# Patient Record
Sex: Female | Born: 1960 | Race: White | Hispanic: No | Marital: Married | Smoking: Never smoker
Health system: Southern US, Community
[De-identification: ages and names within clinical notes are randomized; demographics above are authoritative.]

## PROBLEM LIST (undated history)

## (undated) DIAGNOSIS — E119 Type 2 diabetes mellitus without complications: Secondary | ICD-10-CM

## (undated) DIAGNOSIS — I10 Essential (primary) hypertension: Secondary | ICD-10-CM

## (undated) DIAGNOSIS — M1990 Unspecified osteoarthritis, unspecified site: Secondary | ICD-10-CM

## (undated) DIAGNOSIS — F429 Obsessive-compulsive disorder, unspecified: Secondary | ICD-10-CM

## (undated) DIAGNOSIS — E039 Hypothyroidism, unspecified: Secondary | ICD-10-CM

## (undated) DIAGNOSIS — F909 Attention-deficit hyperactivity disorder, unspecified type: Secondary | ICD-10-CM

## (undated) DIAGNOSIS — F419 Anxiety disorder, unspecified: Secondary | ICD-10-CM

## (undated) DIAGNOSIS — J45909 Unspecified asthma, uncomplicated: Secondary | ICD-10-CM

## (undated) DIAGNOSIS — K760 Fatty (change of) liver, not elsewhere classified: Secondary | ICD-10-CM

## (undated) DIAGNOSIS — F32A Depression, unspecified: Secondary | ICD-10-CM

## (undated) DIAGNOSIS — F329 Major depressive disorder, single episode, unspecified: Secondary | ICD-10-CM

## (undated) HISTORY — DX: Anxiety disorder, unspecified: F41.9

## (undated) HISTORY — DX: Depression, unspecified: F32.A

## (undated) HISTORY — DX: Type 2 diabetes mellitus without complications: E11.9

## (undated) HISTORY — DX: Attention-deficit hyperactivity disorder, unspecified type: F90.9

## (undated) HISTORY — PX: OTHER SURGICAL HISTORY: SHX169

## (undated) HISTORY — DX: Obsessive-compulsive disorder, unspecified: F42.9

## (undated) HISTORY — DX: Major depressive disorder, single episode, unspecified: F32.9

## (undated) HISTORY — PX: CHOLECYSTECTOMY: SHX55

---

## 1990-03-13 HISTORY — PX: BREAST CYST ASPIRATION: SHX578

## 2003-04-02 ENCOUNTER — Ambulatory Visit (HOSPITAL_BASED_OUTPATIENT_CLINIC_OR_DEPARTMENT_OTHER): Admission: RE | Admit: 2003-04-02 | Discharge: 2003-04-02 | Payer: Self-pay | Admitting: Obstetrics and Gynecology

## 2003-04-02 ENCOUNTER — Ambulatory Visit (HOSPITAL_COMMUNITY): Admission: RE | Admit: 2003-04-02 | Discharge: 2003-04-02 | Payer: Self-pay | Admitting: Obstetrics and Gynecology

## 2004-10-06 ENCOUNTER — Ambulatory Visit: Payer: Self-pay | Admitting: Obstetrics and Gynecology

## 2004-10-19 ENCOUNTER — Ambulatory Visit: Payer: Self-pay | Admitting: Obstetrics and Gynecology

## 2004-11-17 ENCOUNTER — Ambulatory Visit: Payer: Self-pay | Admitting: Oncology

## 2004-12-11 ENCOUNTER — Ambulatory Visit: Payer: Self-pay | Admitting: Oncology

## 2005-03-21 ENCOUNTER — Ambulatory Visit: Payer: Self-pay | Admitting: Oncology

## 2005-03-23 ENCOUNTER — Ambulatory Visit: Payer: Self-pay | Admitting: Oncology

## 2005-04-13 ENCOUNTER — Ambulatory Visit: Payer: Self-pay | Admitting: Oncology

## 2005-11-02 ENCOUNTER — Ambulatory Visit: Payer: Self-pay | Admitting: Obstetrics and Gynecology

## 2006-04-27 ENCOUNTER — Ambulatory Visit: Payer: Self-pay | Admitting: Otolaryngology

## 2006-11-20 ENCOUNTER — Ambulatory Visit: Payer: Self-pay | Admitting: Obstetrics and Gynecology

## 2007-12-10 ENCOUNTER — Ambulatory Visit: Payer: Self-pay | Admitting: Obstetrics and Gynecology

## 2008-08-07 ENCOUNTER — Ambulatory Visit: Payer: Self-pay | Admitting: Unknown Physician Specialty

## 2008-12-23 ENCOUNTER — Ambulatory Visit: Payer: Self-pay | Admitting: Obstetrics and Gynecology

## 2009-07-27 ENCOUNTER — Ambulatory Visit: Payer: Self-pay

## 2010-01-25 ENCOUNTER — Ambulatory Visit: Payer: Self-pay | Admitting: Obstetrics and Gynecology

## 2010-02-07 ENCOUNTER — Ambulatory Visit: Payer: Self-pay | Admitting: Obstetrics and Gynecology

## 2011-02-14 ENCOUNTER — Ambulatory Visit: Payer: Self-pay | Admitting: Obstetrics and Gynecology

## 2012-08-06 ENCOUNTER — Ambulatory Visit: Payer: Self-pay | Admitting: Internal Medicine

## 2013-05-07 ENCOUNTER — Ambulatory Visit: Payer: Self-pay | Admitting: Obstetrics and Gynecology

## 2013-08-15 ENCOUNTER — Ambulatory Visit: Payer: Self-pay | Admitting: Internal Medicine

## 2014-05-11 ENCOUNTER — Ambulatory Visit: Payer: Self-pay | Admitting: Internal Medicine

## 2015-03-30 ENCOUNTER — Other Ambulatory Visit: Payer: Self-pay | Admitting: Internal Medicine

## 2015-03-30 DIAGNOSIS — Z1231 Encounter for screening mammogram for malignant neoplasm of breast: Secondary | ICD-10-CM

## 2015-05-13 ENCOUNTER — Ambulatory Visit
Admission: RE | Admit: 2015-05-13 | Discharge: 2015-05-13 | Disposition: A | Discharge status: Home / Self Care | Payer: BC Managed Care – PPO | Source: Ambulatory Visit | Attending: Internal Medicine | Admitting: Internal Medicine

## 2015-05-13 DIAGNOSIS — Z1231 Encounter for screening mammogram for malignant neoplasm of breast: Secondary | ICD-10-CM | POA: Insufficient documentation

## 2015-06-14 ENCOUNTER — Other Ambulatory Visit: Payer: Self-pay | Admitting: Internal Medicine

## 2015-06-14 DIAGNOSIS — N644 Mastodynia: Secondary | ICD-10-CM

## 2015-06-25 ENCOUNTER — Other Ambulatory Visit: Payer: BC Managed Care – PPO

## 2015-06-25 ENCOUNTER — Ambulatory Visit: Payer: BC Managed Care – PPO

## 2015-06-25 ENCOUNTER — Ambulatory Visit
Admission: RE | Admit: 2015-06-25 | Discharge: 2015-06-25 | Disposition: A | Discharge status: Home / Self Care | Payer: BC Managed Care – PPO | Source: Ambulatory Visit | Attending: Internal Medicine | Admitting: Internal Medicine

## 2015-06-25 DIAGNOSIS — N644 Mastodynia: Secondary | ICD-10-CM

## 2015-12-23 ENCOUNTER — Other Ambulatory Visit: Payer: Self-pay | Admitting: Obstetrics and Gynecology

## 2015-12-23 DIAGNOSIS — Z1231 Encounter for screening mammogram for malignant neoplasm of breast: Secondary | ICD-10-CM

## 2016-05-18 ENCOUNTER — Ambulatory Visit
Admission: RE | Admit: 2016-05-18 | Discharge: 2016-05-18 | Disposition: A | Discharge status: Home / Self Care | Payer: BC Managed Care – PPO | Source: Ambulatory Visit | Attending: Obstetrics and Gynecology | Admitting: Obstetrics and Gynecology

## 2016-05-18 DIAGNOSIS — Z1231 Encounter for screening mammogram for malignant neoplasm of breast: Secondary | ICD-10-CM | POA: Diagnosis not present

## 2016-10-23 ENCOUNTER — Encounter (HOSPITAL_COMMUNITY): Payer: Self-pay | Admitting: Psychiatry

## 2016-10-23 ENCOUNTER — Other Ambulatory Visit (HOSPITAL_COMMUNITY): Payer: BC Managed Care – PPO

## 2016-10-23 ENCOUNTER — Other Ambulatory Visit (HOSPITAL_COMMUNITY): Payer: BC Managed Care – PPO | Attending: Psychiatry | Admitting: Psychiatry

## 2016-10-23 DIAGNOSIS — F332 Major depressive disorder, recurrent severe without psychotic features: Secondary | ICD-10-CM | POA: Insufficient documentation

## 2016-10-23 DIAGNOSIS — F502 Bulimia nervosa: Secondary | ICD-10-CM | POA: Insufficient documentation

## 2016-10-23 DIAGNOSIS — F900 Attention-deficit hyperactivity disorder, predominantly inattentive type: Secondary | ICD-10-CM | POA: Diagnosis not present

## 2016-10-23 DIAGNOSIS — F5000 Anorexia nervosa, unspecified: Secondary | ICD-10-CM | POA: Insufficient documentation

## 2016-10-23 DIAGNOSIS — F429 Obsessive-compulsive disorder, unspecified: Secondary | ICD-10-CM | POA: Diagnosis not present

## 2016-10-23 MED ORDER — LISDEXAMFETAMINE DIMESYLATE 20 MG PO CAPS: 20.0000 mg | ORAL_CAPSULE | Freq: Every day | ORAL | 0 refills | Status: DC

## 2016-10-23 NOTE — Progress Notes (Signed)
    Daily Group Progress Note  Program: IOP  Group Time: 9:00-12:00  Participation Level: Active  Behavioral Response: Appropriate  Type of Therapy:  Group Therapy  Summary of Progress: Pt.'s first day in group and met with psychiatrist and case manager for intake assessment for most of the group. Pt. Presented as generally anxious, but was able to introduce herself to the group. Pt. Shared with the group that she was challenged by OCD and that work and her adolescent son with ADHD were significant stressors for her.       Nancie Neas, LPC
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
## 2016-10-24 ENCOUNTER — Other Ambulatory Visit (HOSPITAL_COMMUNITY): Payer: BC Managed Care – PPO | Admitting: Psychiatry

## 2016-10-24 DIAGNOSIS — F332 Major depressive disorder, recurrent severe without psychotic features: Secondary | ICD-10-CM | POA: Diagnosis not present

## 2016-10-24 DIAGNOSIS — F900 Attention-deficit hyperactivity disorder, predominantly inattentive type: Secondary | ICD-10-CM

## 2016-10-24 NOTE — Progress Notes (Signed)
    Daily Group Progress Note  Program: IOP  Group Time: 9:00-12:00  Participation Level: Active  Behavioral Response: Appropriate  Type of Therapy:  Group Therapy  Summary of Progress: Pt. Presents with primarily flat affect, talkative, engaged in the group process. Pt. Shared that she was doing "ok". Pt. Shared that she did not sleep well last night because she was worried about coming to group, but that she usually sleeps on and off during the day as a pattern of avoidance. Pt. Discussed in detail that she is unhappy in her job, that she has significant problem in the workplace being assertive with her manager and asking for necessary space boundaries to get her work done. Pt. Discussed that she does not do this because she is afraid of retaliation from her manager. Pt. Participated in discussion about understanding intentional and unintentional cognitive models. Pt. Participated in grief and loss group with the chaplain.     Shaune PollackBrown, Jennifer B, LPC

## 2016-10-25 ENCOUNTER — Other Ambulatory Visit (HOSPITAL_COMMUNITY): Payer: BC Managed Care – PPO | Admitting: Psychiatry

## 2016-10-25 DIAGNOSIS — F332 Major depressive disorder, recurrent severe without psychotic features: Secondary | ICD-10-CM | POA: Diagnosis not present

## 2016-10-25 DIAGNOSIS — F900 Attention-deficit hyperactivity disorder, predominantly inattentive type: Secondary | ICD-10-CM

## 2016-10-26 ENCOUNTER — Other Ambulatory Visit (HOSPITAL_COMMUNITY): Payer: BC Managed Care – PPO | Admitting: Psychiatry

## 2016-10-26 DIAGNOSIS — F900 Attention-deficit hyperactivity disorder, predominantly inattentive type: Secondary | ICD-10-CM

## 2016-10-26 DIAGNOSIS — F332 Major depressive disorder, recurrent severe without psychotic features: Secondary | ICD-10-CM | POA: Diagnosis not present

## 2016-10-26 NOTE — Progress Notes (Signed)
    Daily Group Progress Note  Program: IOP  Group Time: 9:00-12:00  Participation Level: Active  Behavioral Response: Appropriate  Type of Therapy:  Group Therapy  Summary of Progress: Pt. Presents with brightened affect, talkative and responsive to questions from the therapist and other group members, mildly anxious. Pt. Participated in day two of cognitive modeling activity. Pt. Discussed her history with OCD and fear of germs that has caused problems with work and personal life. Pt. Discussed her desire to change, but concern that she would not be able to work through this. Pt. Discussed her unintentional model that people have germs and germs are bad. This is a thought that causes her fear and anxiety and over the time the anxiety has resulted in loneliness, boredom, and isolation.      Shaune PollackBrown, Jennifer B, LPC

## 2016-10-27 ENCOUNTER — Other Ambulatory Visit (HOSPITAL_COMMUNITY): Payer: BC Managed Care – PPO | Admitting: Psychiatry

## 2016-10-27 DIAGNOSIS — F332 Major depressive disorder, recurrent severe without psychotic features: Secondary | ICD-10-CM | POA: Diagnosis not present

## 2016-10-27 DIAGNOSIS — F900 Attention-deficit hyperactivity disorder, predominantly inattentive type: Secondary | ICD-10-CM

## 2016-10-27 NOTE — Progress Notes (Signed)
    Daily Group Progress Note  Program: IOP  Group Time: 9:00-12:00  Participation Level: Active  Behavioral Response: Appropriate  Type of Therapy:  Group Therapy  Summary of Progress: Pt. Presents as anxious. Pt. Continued to discussed history with OCD and problems with managing the disorder. Counselor discussed developing frustration tolerance generally and coming up with a plan for the patient to find one manageable goal for exposing herself to a stressor. Pt. Agreed that she would be able to expose herself to doorknobs, wash her hands or use hand sanitizer and sit with the discomfort of having touched the doorknob during the group. Counselor reviewed the grounding series and directed the group in a brief breath focused meditation to help with building distress tolerance.      Shaune Pollack, LPC

## 2016-10-30 ENCOUNTER — Other Ambulatory Visit (HOSPITAL_COMMUNITY): Payer: BC Managed Care – PPO | Admitting: Psychiatry

## 2016-10-30 DIAGNOSIS — F332 Major depressive disorder, recurrent severe without psychotic features: Secondary | ICD-10-CM | POA: Diagnosis not present

## 2016-10-30 DIAGNOSIS — F900 Attention-deficit hyperactivity disorder, predominantly inattentive type: Secondary | ICD-10-CM

## 2016-10-30 NOTE — Progress Notes (Signed)
    Daily Group Progress Note  Program: IOP    Group Time: 9:00-12:00   Participation Level: Active   Behavioral Response: Appropriate, Rigid   Type of Therapy:  Group Therapy   Summary of Progress: Pt. presented as reserved but engaged.  Pt. asked other group members questions about their medications and made comments like, "I wish I didn't feel anything".  Pt. expressed that she feels pain in her stomach in the morning at the thought of having to be around other people during her day.  Pt. said that she doesn't like her job and does not care about it. Pt. watched Ted Talk about positive psychology and engaged in group discussion regarding incorporating healthy habits, such as gratitude, journaling, acts of kindness, exercise and meditation into daily life.  Counselor presented RAIN model of mindfulness.    Shaune Pollack, LPC

## 2016-10-31 ENCOUNTER — Other Ambulatory Visit (HOSPITAL_COMMUNITY): Payer: BC Managed Care – PPO | Admitting: Psychiatry

## 2016-10-31 DIAGNOSIS — F332 Major depressive disorder, recurrent severe without psychotic features: Secondary | ICD-10-CM | POA: Diagnosis not present

## 2016-10-31 DIAGNOSIS — F900 Attention-deficit hyperactivity disorder, predominantly inattentive type: Secondary | ICD-10-CM
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
## 2016-11-01 ENCOUNTER — Other Ambulatory Visit (HOSPITAL_COMMUNITY): Payer: BC Managed Care – PPO | Admitting: Psychiatry

## 2016-11-01 DIAGNOSIS — F900 Attention-deficit hyperactivity disorder, predominantly inattentive type: Secondary | ICD-10-CM

## 2016-11-01 DIAGNOSIS — F332 Major depressive disorder, recurrent severe without psychotic features: Secondary | ICD-10-CM | POA: Diagnosis not present

## 2016-11-02 ENCOUNTER — Other Ambulatory Visit (HOSPITAL_COMMUNITY): Payer: BC Managed Care – PPO | Admitting: Psychiatry

## 2016-11-02 DIAGNOSIS — F900 Attention-deficit hyperactivity disorder, predominantly inattentive type: Secondary | ICD-10-CM

## 2016-11-02 DIAGNOSIS — F332 Major depressive disorder, recurrent severe without psychotic features: Secondary | ICD-10-CM | POA: Diagnosis not present

## 2016-11-02 NOTE — Progress Notes (Signed)
    Daily Group Progress Note  Program: IOP  Group Time: 9:00-12:00  Participation Level: Active  Behavioral Response: Appropriate  Type of Therapy:  Group Therapy  Summary of Progress: Pt. Met with psychiatrist and case manager in preparation for discharge. Pt. Smiles appropriately and engaged with the group. Pt. Reported some progress in her ability to assert herself with her husband and her son. Pt. Continues to state "I would like not to feel as this is peaceful to me" and was open to feedback that from the counselor that recovery from depression would look like greater ability to feel variety of feelings including sadness, anger, joy, and contentment. Pt. Participated in breath focused meditation and stated that she continues to be challenged by the tendency to over thing the process of meditation and critique herself.     Nancie Neas, LPC

## 2016-11-02 NOTE — Progress Notes (Signed)
    Daily Group Progress Note  Program: IOP  Group Time: 9:00-12:00   Participation Level: Active   Behavioral Response: Appropriate, Sharing   Type of Therapy:  Group Therapy   Summary of Progress: Pt presented as engaged.  Pt shared that she was verbally a bit "aggressive" with her son and husband the night before, when her son refused to hand over his phone.  Pt said that the interaction didn't feel good but that her son apologized.  Pt told her husband "You need to support me!".  He then stormed out of the house and the two of them had not debriefed since.  Pt's son requested that only one of the parents come and pick him up from soccer practice "because A/C is better in the front seat".  Counselor pointed out that perhaps alliances are being formed within the family unit.  Pt listened to and engaged in presentation about nutrition, sleep and exercise provided by representative from [facility].  Pt said she gets daily exercise by walking her dog.  Shaune Pollack, LPC

## 2016-11-02 NOTE — Progress Notes (Signed)
Patient ID: GLENDIA KEIM, female   DOB: 13-Nov-1960, 56 y.o.   MRN: [id number]  [facility] IOP DISCHARGE NOTE  Patient:  Victoria Patterson DOB:  07/03/1960  Date of Admission: 10/23/2016  Date of Discharge: 11/02/2016  Reason for Admission:depression  IOP Course:attended and participated.  Still having suicidal thoughts at times but no intent or plan.  Too anxious to return to work and likely will not return to work when the temporary disability runs out.  Not able to work anywhere at the moment.  Overall however she is better, less depressed and more able to see herself working again.  Some eating issues with bulimia but not severe.  Vyvanse was tolerated and was increased by referring psychiatrist to 50 mg and that is likely to help the eating disorder as well as the ADHD symptoms.  No change in the OCD symptoms  Mental Status at Discharge:still suicidal thinking but no intent or plans.  Still depressed but less than on admission.  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  Bulimia.  ADHD inattentive  Level of Care:  IOP  Discharge destination:  Has appointments with her psychiatrist and therapist    Comments:  Ms Ludwig has done well considering the combination of disorders she has to deal with   The patient received suicide prevention pamphlet:  Yes   Carolanne Grumbling, MD

## 2016-11-02 NOTE — Progress Notes (Signed)
Victoria Patterson is a 56 y.o. , married, employed, Caucasian female who was referred per Dr. Maryruth Bun; treatment for worsening depressive and anxiety symptoms.  Pt's OCD is currently presenting as a germ phobia with multiple handwashing daily, inability to eat out, stay in hotels or go anywhere except church and grocery store.  Stated she's been depressed for yrs, but symptoms started to worsen a couple of months ago.  Triggers/Stressors:  1) Job of seven yrs.  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  Reports having poor evaluations recently.  Pt is having difficulty performing due to the ADD and OCD symptoms.  2)  59 yr old ADD son.  Pt states there has been conflict between she and him.  "He doesn't want to follow my directions and my husband (stepdad) wants to continue being his friend so he doesn't discipline him."  3)  Husband has colon cancer complications.  He had part of rectum removed in 2010; has a bag now.  Denies any previous psychiatric hospitalizations or suicide attempts.  Has been seeing Dr. Maryruth Bun and Karen [place], [facility] on an outpatient basis.  Reports ~ 14 yrs ago struggling with anorexia/bulemia.  Family hx:  Deceased mother (Bipolar) Deceased father (ETOH)  Pt successfully completed MH-IOP today.  Pt reports that she is working towards her goal of  feeling a little less depressed; which is evident by her Burns Depression Checklist .  Upon admission pt scored 40; today she scored 32.  "I have a desire to live again and I am relaxing more."  Continues to c/o ruminating a lot though.  "I have been able to voice my needs to my husband and son recently."  Pt states that Dr. Maryruth Bun has taken her out of work for six months.  Encouraged pt to take the time to work on self-care and not worry about employment.  Pt denies SI/HI or A/V hallucinations.  A:  D/C today.  F/U with Dr Maryruth Bun and Karen [place], [facility].  Encouraged support groups.  Provided pt with support.  Recommended pt to continue with [facility] anxiety  groups.  R:  Pt receptive.                                                                                                        Chestine Spore, RITA, M.Ed, CNA

## 2016-11-02 NOTE — Progress Notes (Signed)
    Daily Group Progress Note  Program: IOP  Group Time: 9:00-12:00   Participation Level: Active   Behavioral Response: Appropriate, Sharing   Type of Therapy:  Group Therapy   Summary of Progress: Pt presented as engaged.  Pt shared that her son's father "cussed her out" on the phone, which bothered her.  Then the same day, her son argued with her about his phone and her husband yelled at the son, which also caused her anxiety.  Pt shared that she has never felt that her husband supported her in parenting her son.  Pt shared that she sometimes wishes she did not have to feel at all-counselor challenged this statement.  Counselor recommended a support group for parents of children with ADD and/or a support group for individuals with OCD.  Pt indicated that she knew of a group for OCD.  Counselors and other group members provided pt with positive feedback surrounding both her situation and her ability to be vulnerable with the group.  Pt watched "The Power of Vulnerability" Ted Talk by Teresa Coombs, and engaged in group discussion about vulnerability.  Shaune Pollack, LPC

## 2016-11-02 NOTE — Patient Instructions (Signed)
D:  Patient successfully completed MH-IOP today.  A:  Follow up with Dr. Maryruth Bun  and Karen [place], [facility].  Encouraged support groups.  R:  Patient receptive.

## 2016-11-03 ENCOUNTER — Other Ambulatory Visit (HOSPITAL_COMMUNITY): Payer: BC Managed Care – PPO

## 2016-11-06 ENCOUNTER — Other Ambulatory Visit (HOSPITAL_COMMUNITY): Payer: BC Managed Care – PPO

## 2016-11-07 ENCOUNTER — Other Ambulatory Visit (HOSPITAL_COMMUNITY): Payer: BC Managed Care – PPO

## 2016-11-07 ENCOUNTER — Telehealth (HOSPITAL_COMMUNITY): Payer: Self-pay | Admitting: *Deleted

## 2016-11-07 NOTE — Telephone Encounter (Addendum)
Prior authorization for Vyvanse received. Unable to complete request due to patient not being seen in outpatient department. Patient must follow up with current outpatient psychiatrist.

## 2016-11-07 NOTE — Telephone Encounter (Signed)
Opened in error

## 2016-11-08 ENCOUNTER — Other Ambulatory Visit (HOSPITAL_COMMUNITY): Payer: BC Managed Care – PPO

## 2016-11-09 ENCOUNTER — Other Ambulatory Visit (HOSPITAL_COMMUNITY): Payer: BC Managed Care – PPO

## 2016-11-10 ENCOUNTER — Other Ambulatory Visit (HOSPITAL_COMMUNITY): Payer: BC Managed Care – PPO

## 2016-11-14 ENCOUNTER — Other Ambulatory Visit (HOSPITAL_COMMUNITY): Payer: BC Managed Care – PPO

## 2016-11-15 ENCOUNTER — Other Ambulatory Visit (HOSPITAL_COMMUNITY): Payer: BC Managed Care – PPO

## 2016-11-16 ENCOUNTER — Other Ambulatory Visit (HOSPITAL_COMMUNITY): Payer: BC Managed Care – PPO

## 2016-11-17 ENCOUNTER — Other Ambulatory Visit (HOSPITAL_COMMUNITY): Payer: BC Managed Care – PPO

## 2016-11-20 ENCOUNTER — Other Ambulatory Visit (HOSPITAL_COMMUNITY): Payer: BC Managed Care – PPO

## 2016-11-21 ENCOUNTER — Other Ambulatory Visit (HOSPITAL_COMMUNITY): Payer: BC Managed Care – PPO

## 2016-11-22 ENCOUNTER — Other Ambulatory Visit (HOSPITAL_COMMUNITY): Payer: BC Managed Care – PPO

## 2016-11-23 ENCOUNTER — Other Ambulatory Visit (HOSPITAL_COMMUNITY): Payer: BC Managed Care – PPO

## 2016-11-24 ENCOUNTER — Other Ambulatory Visit (HOSPITAL_COMMUNITY): Payer: BC Managed Care – PPO

## 2016-11-27 ENCOUNTER — Other Ambulatory Visit (HOSPITAL_COMMUNITY): Payer: BC Managed Care – PPO

## 2016-11-28 ENCOUNTER — Other Ambulatory Visit (HOSPITAL_COMMUNITY): Payer: BC Managed Care – PPO

## 2016-11-29 ENCOUNTER — Other Ambulatory Visit (HOSPITAL_COMMUNITY): Payer: BC Managed Care – PPO

## 2016-11-30 ENCOUNTER — Other Ambulatory Visit (HOSPITAL_COMMUNITY): Payer: BC Managed Care – PPO

## 2016-12-01 ENCOUNTER — Other Ambulatory Visit (HOSPITAL_COMMUNITY): Payer: BC Managed Care – PPO

## 2016-12-04 ENCOUNTER — Other Ambulatory Visit (HOSPITAL_COMMUNITY): Payer: BC Managed Care – PPO

## 2016-12-05 ENCOUNTER — Other Ambulatory Visit (HOSPITAL_COMMUNITY): Payer: BC Managed Care – PPO

## 2016-12-06 ENCOUNTER — Other Ambulatory Visit (HOSPITAL_COMMUNITY): Payer: BC Managed Care – PPO

## 2016-12-07 ENCOUNTER — Other Ambulatory Visit (HOSPITAL_COMMUNITY): Payer: BC Managed Care – PPO

## 2016-12-08 ENCOUNTER — Other Ambulatory Visit (HOSPITAL_COMMUNITY): Payer: BC Managed Care – PPO

## 2016-12-11 ENCOUNTER — Other Ambulatory Visit (HOSPITAL_COMMUNITY): Payer: BC Managed Care – PPO

## 2016-12-12 ENCOUNTER — Other Ambulatory Visit (HOSPITAL_COMMUNITY): Payer: BC Managed Care – PPO

## 2016-12-13 ENCOUNTER — Other Ambulatory Visit (HOSPITAL_COMMUNITY): Payer: BC Managed Care – PPO

## 2016-12-14 ENCOUNTER — Other Ambulatory Visit (HOSPITAL_COMMUNITY): Payer: BC Managed Care – PPO

## 2016-12-15 ENCOUNTER — Other Ambulatory Visit (HOSPITAL_COMMUNITY): Payer: BC Managed Care – PPO

## 2017-05-15 ENCOUNTER — Other Ambulatory Visit: Payer: Self-pay | Admitting: Obstetrics and Gynecology

## 2017-05-15 DIAGNOSIS — Z1231 Encounter for screening mammogram for malignant neoplasm of breast: Secondary | ICD-10-CM

## 2017-06-04 ENCOUNTER — Ambulatory Visit
Admission: RE | Admit: 2017-06-04 | Discharge: 2017-06-04 | Disposition: A | Discharge status: Home / Self Care | Payer: Managed Care, Other (non HMO) | Source: Ambulatory Visit | Attending: Obstetrics and Gynecology | Admitting: Obstetrics and Gynecology

## 2017-06-04 DIAGNOSIS — Z1231 Encounter for screening mammogram for malignant neoplasm of breast: Secondary | ICD-10-CM | POA: Insufficient documentation

## 2017-11-07 ENCOUNTER — Encounter: Payer: Self-pay | Admitting: Student in an Organized Health Care Education/Training Program

## 2017-11-07 ENCOUNTER — Ambulatory Visit
Payer: Managed Care, Other (non HMO) | Attending: Student in an Organized Health Care Education/Training Program | Admitting: Student in an Organized Health Care Education/Training Program

## 2017-11-07 ENCOUNTER — Other Ambulatory Visit: Payer: Self-pay | Admitting: Internal Medicine

## 2017-11-07 ENCOUNTER — Other Ambulatory Visit: Payer: Self-pay | Admitting: Student in an Organized Health Care Education/Training Program

## 2017-11-07 ENCOUNTER — Other Ambulatory Visit: Payer: Self-pay

## 2017-11-07 VITALS — BP 138/96 | HR 78 | Temp 97.8°F | Resp 16 | Ht 64.0 in | Wt 230.0 lb

## 2017-11-07 DIAGNOSIS — J45909 Unspecified asthma, uncomplicated: Secondary | ICD-10-CM | POA: Diagnosis not present

## 2017-11-07 DIAGNOSIS — E119 Type 2 diabetes mellitus without complications: Secondary | ICD-10-CM | POA: Diagnosis not present

## 2017-11-07 DIAGNOSIS — F419 Anxiety disorder, unspecified: Secondary | ICD-10-CM | POA: Diagnosis not present

## 2017-11-07 DIAGNOSIS — M7989 Other specified soft tissue disorders: Secondary | ICD-10-CM

## 2017-11-07 DIAGNOSIS — M25562 Pain in left knee: Secondary | ICD-10-CM

## 2017-11-07 DIAGNOSIS — M25561 Pain in right knee: Secondary | ICD-10-CM | POA: Diagnosis not present

## 2017-11-07 DIAGNOSIS — Z818 Family history of other mental and behavioral disorders: Secondary | ICD-10-CM | POA: Diagnosis not present

## 2017-11-07 DIAGNOSIS — G894 Chronic pain syndrome: Secondary | ICD-10-CM | POA: Diagnosis not present

## 2017-11-07 DIAGNOSIS — F329 Major depressive disorder, single episode, unspecified: Secondary | ICD-10-CM | POA: Diagnosis not present

## 2017-11-07 DIAGNOSIS — F909 Attention-deficit hyperactivity disorder, unspecified type: Secondary | ICD-10-CM | POA: Diagnosis not present

## 2017-11-07 DIAGNOSIS — Z79899 Other long term (current) drug therapy: Secondary | ICD-10-CM | POA: Insufficient documentation

## 2017-11-07 DIAGNOSIS — F428 Other obsessive-compulsive disorder: Secondary | ICD-10-CM | POA: Insufficient documentation

## 2017-11-07 DIAGNOSIS — G8929 Other chronic pain: Secondary | ICD-10-CM

## 2017-11-07 DIAGNOSIS — M170 Bilateral primary osteoarthritis of knee: Secondary | ICD-10-CM

## 2017-11-07 DIAGNOSIS — Z888 Allergy status to other drugs, medicaments and biological substances status: Secondary | ICD-10-CM | POA: Diagnosis not present

## 2017-11-07 DIAGNOSIS — Z7984 Long term (current) use of oral hypoglycemic drugs: Secondary | ICD-10-CM | POA: Diagnosis not present

## 2017-11-07 DIAGNOSIS — Z79890 Hormone replacement therapy (postmenopausal): Secondary | ICD-10-CM | POA: Diagnosis not present

## 2017-11-07 DIAGNOSIS — E039 Hypothyroidism, unspecified: Secondary | ICD-10-CM | POA: Diagnosis not present

## 2017-11-07 DIAGNOSIS — Z880 Allergy status to penicillin: Secondary | ICD-10-CM | POA: Diagnosis not present

## 2017-11-07 MED ORDER — DICLOFENAC SODIUM 75 MG PO TBEC: 75.0000 mg | DELAYED_RELEASE_TABLET | Freq: Two times a day (BID) | ORAL | 1 refills | Status: DC

## 2017-11-07 NOTE — Progress Notes (Signed)
Patient's Name: Victoria Patterson  MRN: [id number]  Referring Provider: Diamond Nickel, MD  DOB: 1961-03-01  PCP: Victoria Crouch, MD  DOS: 11/07/2017  Note by: Victoria Santa, MD  Service setting: Ambulatory outpatient  Specialty: Interventional Pain Management  Location: [facility] (AMB) Pain Management Facility  Visit type: Initial Patient Evaluation  Patient type: New Patient   Primary Reason(s) for Visit: Encounter for initial evaluation of one or more chronic problems (new to examiner) potentially causing chronic pain, and posing a threat to normal musculoskeletal function. (Level of risk: High) CC: Knee Pain (left)  HPI  Victoria Patterson is a 57 y.o. year old, female patient, who comes today to see [place] for the first time for an initial evaluation of her chronic pain. She has Bilateral primary osteoarthritis of knee; Chronic pain of both knees; Chronic pain syndrome; Arthralgia of both knees; Acquired hypothyroidism; ADD (attention deficit disorder); Asthma without status asthmaticus; Depression; and Diabetes mellitus type 2, uncomplicated ([place]) on their problem list. Today she comes in for evaluation of her Knee Pain (left)  Pain Assessment: Location: Left Knee Radiating: denies Onset: More than a month ago Duration: Chronic pain Quality: Sharp, Shooting Severity: 3 /10 (subjective, self-reported pain score)  Note: Reported level is compatible with observation.                         When using our objective Pain Scale, levels between 6 and 10/10 are said to belong in an emergency room, as it progressively worsens from a 6/10, described as severely limiting, requiring emergency care not usually available at an outpatient pain management facility. At a 6/10 level, communication becomes difficult and requires great effort. Assistance to reach the emergency department may be required. Facial flushing and profuse sweating along with potentially dangerous increases in heart rate and blood pressure will be  evident. Effect on ADL: cannot sleep through the night Timing: Intermittent Modifying factors: supporting knee in bed with pillows BP: (!) 138/96  HR: 78  Onset and Duration: Gradual, Date of onset: 11/2016 and Present longer than 3 months Cause of pain: "Standing on concrete floor and also walking for 3-4 hours at work" Severity: Getting worse Timing: During activity or exercise and After activity or exercise Aggravating Factors: Kneeling, Prolonged standing, Squatting, Stooping , Walking, Walking downhill and Working Alleviating Factors: Resting and Sitting Associated Problems: Constipation, Night-time cramps, Fatigue, Swelling, Pain that wakes patient up and Pain that does not allow patient to sleep Quality of Pain: Sharp and Work related Previous Examinations or Tests: Orthopedic evaluation Previous Treatments: Trigger point injections  The patient comes into the clinics today for the first time for a chronic pain management evaluation.   58 year old female presents with a chief complaint of bilateral knee pain.  Patient is referred by Dr. Candelaria Patterson.  Patient is status post right knee intra-articular steroid injection in January 2019 as well as left knee intra-articular steroid injection in March.  She is also had a bursa injection on the 19th.  Patient has completed physical therapy without any significant benefit.  She has tried various medications including NSAIDs such as meloxicam as well as ibuprofen.  Patient presents today for primarily bilateral knee pain that is worse along her inferior knee.  Patient states that she has his knee pain probably because of her job as she is standing on concrete most of the day.  She is referred here for bilateral genicular nerve block.  Medication management to continue  with PCP.  Today I took the time to provide the patient with information regarding my pain practice. The patient was informed that my practice is divided into two sections: an  interventional pain management section, as well as a completely separate and distinct medication management section. I explained that I have procedure days for my interventional therapies, and evaluation days for follow-ups and medication management. Because of the amount of documentation required during both, they are kept separated. This means that there is the possibility that she may be scheduled for a procedure on one day, and medication management the next. I have also informed her that because of staffing and facility limitations, I no longer take patients for medication management only. To illustrate the reasons for this, I gave the patient the example of surgeons, and how inappropriate it would be to refer a patient to his/her care, just to write for the post-surgical antibiotics on a surgery done by a different surgeon.   Because interventional pain management is my board-certified specialty, the patient was informed that joining my practice means that they are open to any and all interventional therapies. I made it clear that this does not mean that they will be forced to have any procedures done. What this means is that I believe interventional therapies to be essential part of the diagnosis and proper management of chronic pain conditions. Therefore, patients not interested in these interventional alternatives will be better served under the care of a different practitioner.  The patient was also made aware of my Comprehensive Pain Management Safety Guidelines where by joining my practice, they limit all of their nerve blocks and joint injections to those done by our practice, for as long as we are retained to manage their care.   Historic Controlled Substance Pharmacotherapy Review   Historical Background Evaluation: [place] PMP: Six (6) year initial data search conducted.             [place] Department of public safety, offender search: Editor, commissioning Information) Non-contributory Risk Assessment  Profile: Aberrant behavior: None observed or detected today Risk factors for fatal opioid overdose: None identified today Fatal overdose hazard ratio (HR): Calculation deferred Non-fatal overdose hazard ratio (HR): Calculation deferred Risk of opioid abuse or dependence: 0.7-3.0% with doses ? 36 MME/day and 6.1-26% with doses ? 120 MME/day. Substance use disorder (SUD) risk level: See below Personal History of Substance Abuse (SUD-Substance use disorder):  Alcohol: Negative  Illegal Drugs: Negative  Rx Drugs: Negative  ORT Risk Level calculation: Moderate Risk Opioid Risk Tool - 11/07/17 1343      Family History of Substance Abuse   Alcohol  Positive Female    Illegal Drugs  Negative    Rx Drugs  Negative      Personal History of Substance Abuse   Alcohol  Negative    Illegal Drugs  Negative    Rx Drugs  Negative      Age   Age between 12-45 years   No      History of Preadolescent Sexual Abuse   History of Preadolescent Sexual Abuse  Negative or Female      Psychological Disease   Psychological Disease  Positive    ADD  --   ADHD   Depression  Positive      Total Score   Opioid Risk Tool Scoring  4    Opioid Risk Interpretation  Moderate Risk      ORT Scoring interpretation table:  Score <3 = Low Risk for SUD  Score between 4-7 = Moderate Risk for SUD  Score >8 = High Risk for Opioid Abuse   PHQ-2 Depression Scale:  Total score: 3  PHQ-2 Scoring interpretation table: (Score and probability of major depressive disorder)  Score 0 = No depression  Score 1 = 15.4% Probability  Score 2 = 21.1% Probability  Score 3 = 38.4% Probability  Score 4 = 45.5% Probability  Score 5 = 56.4% Probability  Score 6 = 78.6% Probability   PHQ-9 Depression Scale:  Total score: 13  PHQ-9 Scoring interpretation table:  Score 0-4 = No depression  Score 5-9 = Mild depression  Score 10-14 = Moderate depression  Score 15-19 = Moderately severe depression  Score 20-27 = Severe  depression (2.4 times higher risk of SUD and 2.89 times higher risk of overuse)   Pharmacologic Plan: As per protocol, I have not taken over any controlled substance management, pending the results of ordered tests and/or consults. Med management to continue per PCP              Meds   Current Outpatient Medications:  .  bisoprolol-hydrochlorothiazide (ZIAC) 5-6.25 MG tablet, Take 1 tablet by mouth daily., Disp: , Rfl:  .  DULoxetine (CYMBALTA) 60 MG capsule, Take 60 mg by mouth daily., Disp: , Rfl:  .  glimepiride (AMARYL) 2 MG tablet, Take 4 mg by mouth daily with breakfast. , Disp: , Rfl:  .  lamoTRIgine (LAMICTAL) 100 MG tablet, Take 100 mg by mouth daily., Disp: , Rfl:  .  levothyroxine (SYNTHROID, LEVOTHROID) 50 MCG tablet, Take 50 mcg by mouth daily before breakfast., Disp: , Rfl:  .  metFORMIN (GLUCOPHAGE) 1000 MG tablet, Take 1,000 mg by mouth 2 (two) times daily with a meal., Disp: , Rfl:  .  diclofenac (VOLTAREN) 75 MG EC tablet, Take 1 tablet (75 mg total) by mouth 2 (two) times daily after a meal., Disp: 60 tablet, Rfl: 1 .  lisdexamfetamine (VYVANSE) 20 MG capsule, Take 1 capsule (20 mg total) by mouth daily. (Patient not taking: Reported on 11/07/2017), Disp: 30 capsule, Rfl: 0 .  LORazepam (ATIVAN) 0.5 MG tablet, Take 0.5 mg by mouth every 8 (eight) hours., Disp: , Rfl:   Imaging Review   Complexity Note: Imaging results reviewed. Results shared with Victoria Patterson, using [employer].                         ROS  Cardiovascular: High blood pressure Pulmonary or Respiratory: Wheezing and difficulty taking a deep full breath (Asthma) and Snoring  Neurological: No reported neurological signs or symptoms such as seizures, abnormal skin sensations, urinary and/or fecal incontinence, being born with an abnormal open spine and/or a tethered spinal cord Review of Past Neurological Studies: No results found for this or any previous visit. Psychological-Psychiatric: Anxiousness,  Depressed and Prone to panicking Gastrointestinal: Irregular, infrequent bowel movements (Constipation) Genitourinary: No reported renal or genitourinary signs or symptoms such as difficulty voiding or producing urine, peeing blood, non-functioning kidney, kidney stones, difficulty emptying the bladder, difficulty controlling the flow of urine, or chronic kidney disease Hematological: No reported hematological signs or symptoms such as prolonged bleeding, low or poor functioning platelets, bruising or bleeding easily, hereditary bleeding problems, low energy levels due to low hemoglobin or being anemic Endocrine: High blood sugar requiring insulin (IDDM) and Slow thyroid Rheumatologic: Joint aches and or swelling due to excess weight (Osteoarthritis) Musculoskeletal: Negative for myasthenia gravis, muscular dystrophy, multiple sclerosis or malignant hyperthermia  Work History: Working full time  Allergies  Victoria Patterson is allergic to penicillins and stadol [butorphanol].  Laboratory Chemistry  Inflammation Markers (CRP: Acute Phase) (ESR: Chronic Phase) No results found for: CRP, ESRSEDRATE, LATICACIDVEN                       Rheumatology Markers No results found for: RF, ANA, LABURIC, URICUR, LYMEIGGIGMAB, LYMEABIGMQN, HLAB27                      Renal Function Markers No results found for: BUN, CREATININE, BCR, GFRAA, GFRNONAA                           Hepatic Function Markers No results found for: AST, ALT, ALBUMIN, ALKPHOS, HCVAB, AMYLASE, LIPASE, AMMONIA                      Electrolytes No results found for: NA, K, CL, CALCIUM, MG, PHOS                      Neuropathy Markers No results found for: VITAMINB12, FOLATE, HGBA1C, HIV                      Bone Pathology Markers No results found for: VD25OH, [id number], PI9518AC1, [id number], 25OHVITD1, 25OHVITD2, 25OHVITD3, TESTOFREE, TESTOSTERONE                       Coagulation Parameters No results found for: INR, LABPROT, APTT,  PLT, DDIMER                      Cardiovascular Markers No results found for: BNP, CKTOTAL, CKMB, TROPONINI, HGB, HCT                       CA Markers No results found for: CEA, CA125, LABCA2                      Note: Lab results reviewed.  [place]  Drug: Victoria Patterson  reports that she does not use drugs. Alcohol:  reports that she does not drink alcohol. Tobacco:  reports that she has never smoked. She has never used smokeless tobacco. Medical:  has a past medical history of ADHD (attention deficit hyperactivity disorder), Anxiety, Depression, Diabetes mellitus, type II ([place]), and Obsessive-compulsive disorder. Family: family history includes Alcohol abuse in her father; Bipolar disorder in her mother; Breast cancer in her cousin; Breast cancer (age of onset: 66) in her paternal aunt; Ovarian cancer (age of onset: 10) in her mother.  Past Surgical History:  Procedure Laterality Date  . BREAST CYST ASPIRATION Left 1992   neg   Active Ambulatory Problems    Diagnosis Date Noted  . Bilateral primary osteoarthritis of knee 11/09/2017  . Chronic pain of both knees 11/09/2017  . Chronic pain syndrome 11/09/2017  . Arthralgia of both knees 11/09/2017  . Acquired hypothyroidism 11/09/2017  . ADD (attention deficit disorder) 11/09/2017  . Asthma without status asthmaticus 11/09/2017  . Depression 11/09/2017  . Diabetes mellitus type 2, uncomplicated ([place]) 10/93/2355   Resolved Ambulatory Problems    Diagnosis Date Noted  . No Resolved Ambulatory Problems   Past Medical History:  Diagnosis Date  . ADHD (attention deficit hyperactivity disorder)   . Anxiety   . Diabetes mellitus, type II ([place])   .  Obsessive-compulsive disorder    Constitutional Exam  General appearance: Well nourished, well developed, and well hydrated. In no apparent acute distress Vitals:   11/07/17 1326  BP: (!) 138/96  Pulse: 78  Resp: 16  Temp: 97.8 F (36.6 C)  TempSrc: Oral  SpO2: 98%  Weight: 230 lb  (104.3 kg)  Height: '5\' 4"'$  (1.626 m)   BMI Assessment: Estimated body mass index is 39.48 kg/m as calculated from the following:   Height as of this encounter: '5\' 4"'$  (1.626 m).   Weight as of this encounter: 230 lb (104.3 kg).  BMI interpretation table: BMI level Category Range association with higher incidence of chronic pain  <18 kg/m2 Underweight   18.5-24.9 kg/m2 Ideal body weight   25-29.9 kg/m2 Overweight Increased incidence by 20%  30-34.9 kg/m2 Obese (Class I) Increased incidence by 68%  35-39.9 kg/m2 Severe obesity (Class II) Increased incidence by 136%  >40 kg/m2 Extreme obesity (Class III) Increased incidence by 254%   Patient's current BMI Ideal Body weight  Body mass index is 39.48 kg/m. Ideal body weight: 54.7 kg (120 lb 9.5 oz) Adjusted ideal body weight: 74.6 kg (164 lb 5.7 oz)   BMI Readings from Last 4 Encounters:  11/07/17 39.48 kg/m   Wt Readings from Last 4 Encounters:  11/07/17 230 lb (104.3 kg)  Psych/Mental status: Alert, oriented x 3 (person, place, & time)       Eyes: PERLA Respiratory: No evidence of acute respiratory distress  Cervical Spine Area Exam  Skin & Axial Inspection: No masses, redness, edema, swelling, or associated skin lesions Alignment: Symmetrical Functional ROM: Unrestricted ROM      Stability: No instability detected Muscle Tone/Strength: Functionally intact. No obvious neuro-muscular anomalies detected. Sensory (Neurological): Unimpaired Palpation: No palpable anomalies              Upper Extremity (UE) Exam    Side: Right upper extremity  Side: Left upper extremity  Skin & Extremity Inspection: Skin color, temperature, and hair growth are WNL. No peripheral edema or cyanosis. No masses, redness, swelling, asymmetry, or associated skin lesions. No contractures.  Skin & Extremity Inspection: Skin color, temperature, and hair growth are WNL. No peripheral edema or cyanosis. No masses, redness, swelling, asymmetry, or associated  skin lesions. No contractures.  Functional ROM: Unrestricted ROM          Functional ROM: Unrestricted ROM          Muscle Tone/Strength: Functionally intact. No obvious neuro-muscular anomalies detected.  Muscle Tone/Strength: Functionally intact. No obvious neuro-muscular anomalies detected.  Sensory (Neurological): Unimpaired          Sensory (Neurological): Unimpaired          Palpation: No palpable anomalies              Palpation: No palpable anomalies              Provocative Test(s):  Phalen's test: deferred Tinel's test: deferred Apley's scratch test (touch opposite shoulder):  Action 1 (Across chest): deferred Action 2 (Overhead): deferred Action 3 (LB reach): deferred   Provocative Test(s):  Phalen's test: deferred Tinel's test: deferred Apley's scratch test (touch opposite shoulder):  Action 1 (Across chest): deferred Action 2 (Overhead): deferred Action 3 (LB reach): deferred    Thoracic Spine Area Exam  Skin & Axial Inspection: No masses, redness, or swelling Alignment: Symmetrical Functional ROM: Unrestricted ROM Stability: No instability detected Muscle Tone/Strength: Functionally intact. No obvious neuro-muscular anomalies detected. Sensory (Neurological): Unimpaired Muscle strength &  Tone: No palpable anomalies  Lumbar Spine Area Exam  Skin & Axial Inspection: No masses, redness, or swelling Alignment: Symmetrical Functional ROM: Unrestricted ROM       Stability: No instability detected Muscle Tone/Strength: Functionally intact. No obvious neuro-muscular anomalies detected. Sensory (Neurological): Unimpaired Palpation: No palpable anomalies       Provocative Tests: Hyperextension/rotation test: deferred today       Lumbar quadrant test (Kemp's test): deferred today       Lateral bending test: deferred today       Patrick's Maneuver: deferred today                   FABER test: deferred today                   S-I anterior distraction/compression test:  deferred today         S-I lateral compression test: deferred today         S-I Thigh-thrust test: deferred today         S-I Gaenslen's test: deferred today          Gait & Posture Assessment  Ambulation: Unassisted Gait: Relatively normal for age and body habitus Posture: WNL   Lower Extremity Exam    Side: Right lower extremity  Side: Left lower extremity  Stability: No instability observed          Stability: No instability observed          Skin & Extremity Inspection: Skin color, temperature, and hair growth are WNL. No peripheral edema or cyanosis. No masses, redness, swelling, asymmetry, or associated skin lesions. No contractures.  Skin & Extremity Inspection: Skin color, temperature, and hair growth are WNL. No peripheral edema or cyanosis. No masses, redness, swelling, asymmetry, or associated skin lesions. No contractures.  Functional ROM: Unrestricted ROM                  Functional ROM: Unrestricted ROM                  Muscle Tone/Strength: Functionally intact. No obvious neuro-muscular anomalies detected.  Muscle Tone/Strength: Functionally intact. No obvious neuro-muscular anomalies detected.  Sensory (Neurological): Unimpaired  Sensory (Neurological): Unimpaired  Palpation: No palpable anomalies  Palpation: No palpable anomalies   Assessment  Primary Diagnosis & Pertinent Problem List: The primary encounter diagnosis was Bilateral primary osteoarthritis of knee (L>>R). Diagnoses of Chronic pain of both knees (L>R, medial aspect), Chronic pain syndrome, and Arthralgia of both knees were also pertinent to this visit.  Visit Diagnosis (New problems to examiner): 1. Bilateral primary osteoarthritis of knee (L>>R)   2. Chronic pain of both knees (L>R, medial aspect)   3. Chronic pain syndrome   4. Arthralgia of both knees    57 year old female with history of chronic bilateral knee pain who presents as a referral from Dr. Candelaria Patterson for evaluation of bilateral genicular  nerve block.  Patient has failed medication therapy including NSAIDs as well as intra-articular knee steroid injections and bursa injections.  Risks and benefits of bilateral genicular nerve blocks were discussed with patient.  Patient would like to proceed.  Plan: -Bilateral genicular nerve block under fluoroscopy with sedation.  Possible radiofrequency ablation thereafter -Recommended patient discontinue all NSAIDs and trial of diclofenac 75 mg twice daily given increased synovial fluid absorption in the knee seen with this medication.  Plan of Care (Initial workup plan)  Note: Please be advised that as per protocol, today's visit has been an  evaluation only. We have not taken over the patient's controlled substance management.  Ordered Lab-work, Procedure(s), Referral(s), & Consult(s): Orders Placed This Encounter  Procedures  . GENICULAR NERVE BLOCK   Pharmacotherapy (current): Medications ordered:  Meds ordered this encounter  Medications  . diclofenac (VOLTAREN) 75 MG EC tablet    Sig: Take 1 tablet (75 mg total) by mouth 2 (two) times daily after a meal.    Dispense:  60 tablet    Refill:  1   Medications administered during this visit: Mccall ALynnette Caffey had no medications administered during this visit.     Provider-requested follow-up: No follow-ups on file.  Future Appointments  Date Time Provider [place]  11/13/2017 10:30 AM [facility]    Primary Care Physician: Victoria Crouch, MD Location: [facility] Note by: Victoria Patterson, M.D, Date: 11/07/2017; Time: 9:15 AM  Patient Instructions   Stop taking Celebrex.  Diclofenac has been escribed to your pharmacy.   Moderate Conscious Sedation, Adult Sedation is the use of medicines to promote relaxation and relieve discomfort and anxiety. Moderate conscious sedation is a type of sedation. Under moderate conscious sedation, you are less alert than normal, but you are  still able to respond to instructions, touch, or both. Moderate conscious sedation is used during short medical and dental procedures. It is milder than deep sedation, which is a type of sedation under which you cannot be easily woken up. It is also milder than general anesthesia, which is the use of medicines to make you unconscious. Moderate conscious sedation allows you to return to your regular activities sooner. Tell a health care provider about:  Any allergies you have.  All medicines you are taking, including vitamins, herbs, eye drops, creams, and over-the-counter medicines.  Use of steroids (by mouth or creams).  Any problems you or family members have had with sedatives and anesthetic medicines.  Any blood disorders you have.  Any surgeries you have had.  Any medical conditions you have, such as sleep apnea.  Whether you are pregnant or may be pregnant.  Any use of cigarettes, alcohol, marijuana, or street drugs. What are the risks? Generally, this is a safe procedure. However, problems may occur, including:  Getting too much medicine (oversedation).  Nausea.  Allergic reaction to medicines.  Trouble breathing. If this happens, a breathing tube may be used to help with breathing. It will be removed when you are awake and breathing on your own.  Heart trouble.  Lung trouble.  What happens before the procedure? Staying hydrated Follow instructions from your health care provider about hydration, which may include:  Up to 2 hours before the procedure - you may continue to drink clear liquids, such as water, clear fruit juice, black coffee, and plain tea.  Eating and drinking restrictions Follow instructions from your health care provider about eating and drinking, which may include:  8 hours before the procedure - stop eating heavy meals or foods such as meat, fried foods, or fatty foods.  6 hours before the procedure - stop eating light meals or foods, such as  toast or cereal.  6 hours before the procedure - stop drinking milk or drinks that contain milk.  2 hours before the procedure - stop drinking clear liquids.  Medicine  Ask your health care provider about:  Changing or stopping your regular medicines. This is especially important if you are taking diabetes medicines or blood thinners.  Taking medicines such as aspirin and  ibuprofen. These medicines can thin your blood. Do not take these medicines before your procedure if your health care provider instructs you not to.  Tests and exams  You will have a physical exam.  You may have blood tests done to show: ? How well your kidneys and liver are working. ? How well your blood can clot. General instructions  Plan to have someone take you home from the hospital or clinic.  If you will be going home right after the procedure, plan to have someone with you for 24 hours. What happens during the procedure?  An IV tube will be inserted into one of your veins.  Medicine to help you relax (sedative) will be given through the IV tube.  The medical or dental procedure will be performed. What happens after the procedure?  Your blood pressure, heart rate, breathing rate, and blood oxygen level will be monitored often until the medicines you were given have worn off.  Do not drive for 24 hours. This information is not intended to replace advice given to you by your health care provider. Make sure you discuss any questions you have with your health care provider. Document Released: 11/22/2000 Document Revised: 08/03/2015 Document Reviewed: 06/19/2015 Elsevier Interactive Patient Education  2018 [place]  What are the risk, side effects and possible complications? Generally speaking, most procedures are safe.  However, with any procedure there are risks, side effects, and the possibility of complications.  The risks and complications are dependent upon  the sites that are lesioned, or the type of nerve block to be performed.  The closer the procedure is to the spine, the more serious the risks are.  Great care is taken when placing the radio frequency needles, block needles or lesioning probes, but sometimes complications can occur. 1. Infection: Any time there is an injection through the skin, there is a risk of infection.  This is why sterile conditions are used for these blocks.  There are four possible types of infection. 1. Localized skin infection. 2. Central Nervous System Infection-This can be in the form of Meningitis, which can be deadly. 3. Epidural Infections-This can be in the form of an epidural abscess, which can cause pressure inside of the spine, causing compression of the spinal cord with subsequent paralysis. This would require an emergency surgery to decompress, and there are no guarantees that the patient would recover from the paralysis. 4. Discitis-This is an infection of the intervertebral discs.  It occurs in about 1% of discography procedures.  It is difficult to treat and it may lead to surgery.        2. Pain: the needles have to go through skin and soft tissues, will cause soreness.       3. Damage to internal structures:  The nerves to be lesioned may be near blood vessels or    other nerves which can be potentially damaged.       4. Bleeding: Bleeding is more common if the patient is taking blood thinners such as  aspirin, Coumadin, Ticiid, Plavix, etc., or if he/she have some genetic predisposition  such as hemophilia. Bleeding into the spinal canal can cause compression of the spinal  cord with subsequent paralysis.  This would require an emergency surgery to  decompress and there are no guarantees that the patient would recover from the  paralysis.       5. Pneumothorax:  Puncturing of a lung is a possibility, every time a  needle is introduced in  the area of the chest or upper back.  Pneumothorax refers to free air  around the  collapsed lung(s), inside of the thoracic cavity (chest cavity).  Another two possible  complications related to a similar event would include: Hemothorax and Chylothorax.   These are variations of the Pneumothorax, where instead of air around the collapsed  lung(s), you may have blood or chyle, respectively.       6. Spinal headaches: They may occur with any procedures in the area of the spine.       7. Persistent CSF (Cerebro-Spinal Fluid) leakage: This is a rare problem, but may occur  with prolonged intrathecal or epidural catheters either due to the formation of a fistulous  track or a dural tear.       8. Nerve damage: By working so close to the spinal cord, there is always a possibility of  nerve damage, which could be as serious as a permanent spinal cord injury with  paralysis.       9. Death:  Although rare, severe deadly allergic reactions known as "Anaphylactic  reaction" can occur to any of the medications used.      10. Worsening of the symptoms:  We can always make thing worse.  What are the chances of something like this happening? Chances of any of this occuring are extremely low.  By statistics, you have more of a chance of getting killed in a motor vehicle accident: while driving to the hospital than any of the above occurring .  Nevertheless, you should be aware that they are possibilities.  In general, it is similar to taking a shower.  Everybody knows that you can slip, hit your head and get killed.  Does that mean that you should not shower again?  Nevertheless always keep in mind that statistics do not mean anything if you happen to be on the wrong side of them.  Even if a procedure has a 1 (one) in a 1,000,000 (million) chance of going wrong, it you happen to be that one..Also, keep in mind that by statistics, you have more of a chance of having something go wrong when taking medications.  Who should not have this procedure? If you are on a blood thinning medication  (e.g. Coumadin, Plavix, see list of "Blood Thinners"), or if you have an active infection going on, you should not have the procedure.  If you are taking any blood thinners, please inform your physician.  How should I prepare for this procedure?  Do not eat or drink anything at least six hours prior to the procedure.  Bring a driver with you .  It cannot be a taxi.  Come accompanied by an adult that can drive you back, and that is strong enough to help you if your legs get weak or numb from the local anesthetic.  Take all of your medicines the morning of the procedure with just enough water to swallow them.  If you have diabetes, make sure that you are scheduled to have your procedure done first thing in the morning, whenever possible.  If you have diabetes, take only half of your insulin dose and notify our nurse that you have done so as soon as you arrive at the clinic.  If you are diabetic, but only take blood sugar pills (oral hypoglycemic), then do not take them on the morning of your procedure.  You may take them after you have had the procedure.  Do not take aspirin or any aspirin-containing medications, at least eleven (11) days prior to the procedure.  They may prolong bleeding.  Wear loose fitting clothing that may be easy to take off and that you would not mind if it got stained with Betadine or blood.  Do not wear any jewelry or perfume  Remove any nail coloring.  It will interfere with some of our monitoring equipment.  NOTE: Remember that this is not meant to be interpreted as a complete list of all possible complications.  Unforeseen problems may occur.  BLOOD THINNERS The following drugs contain aspirin or other products, which can cause increased bleeding during surgery and should not be taken for 2 weeks prior to and 1 week after surgery.  If you should need take something for relief of minor pain, you may take acetaminophen which is found in Tylenol,m Datril, Anacin-3  and Panadol. It is not blood thinner. The products listed below are.  Do not take any of the products listed below in addition to any listed on your instruction sheet.  A.P.C or A.P.C with Codeine Codeine Phosphate Capsules #3 Ibuprofen Ridaura  ABC compound Congesprin Imuran rimadil  Advil Cope Indocin Robaxisal  Alka-Seltzer Effervescent Pain Reliever and Antacid Coricidin or Coricidin-D  Indomethacin Rufen  Alka-Seltzer plus Cold Medicine Cosprin Ketoprofen S-A-C Tablets  Anacin Analgesic Tablets or Capsules Coumadin Korlgesic Salflex  Anacin Extra Strength Analgesic tablets or capsules CP-2 Tablets Lanoril Salicylate  Anaprox Cuprimine Capsules Levenox Salocol  Anexsia-D Dalteparin Magan Salsalate  Anodynos Darvon compound Magnesium Salicylate Sine-off  Ansaid Dasin Capsules Magsal Sodium Salicylate  Anturane Depen Capsules Marnal Soma  APF Arthritis pain formula Dewitt's Pills Measurin Stanback  Argesic Dia-Gesic Meclofenamic Sulfinpyrazone  Arthritis Bayer Timed Release Aspirin Diclofenac Meclomen Sulindac  Arthritis pain formula Anacin Dicumarol Medipren Supac  Analgesic (Safety coated) Arthralgen Diffunasal Mefanamic Suprofen  Arthritis Strength Bufferin Dihydrocodeine Mepro Compound Suprol  Arthropan liquid Dopirydamole Methcarbomol with Aspirin Synalgos  ASA tablets/Enseals Disalcid Micrainin Tagament  Ascriptin Doan's Midol Talwin  Ascriptin A/D Dolene Mobidin Tanderil  Ascriptin Extra Strength Dolobid Moblgesic Ticlid  Ascriptin with Codeine Doloprin or Doloprin with Codeine Momentum Tolectin  Asperbuf Duoprin Mono-gesic Trendar  Aspergum Duradyne Motrin or Motrin IB Triminicin  Aspirin plain, buffered or enteric coated Durasal Myochrisine Trigesic  Aspirin Suppositories Easprin Nalfon Trillsate  Aspirin with Codeine Ecotrin Regular or Extra Strength Naprosyn Uracel  Atromid-S Efficin Naproxen Ursinus  Auranofin Capsules Elmiron Neocylate Vanquish  Axotal Emagrin  Norgesic Verin  Azathioprine Empirin or Empirin with Codeine Normiflo Vitamin E  Azolid Emprazil Nuprin Voltaren  Bayer Aspirin plain, buffered or children's or timed BC Tablets or powders Encaprin Orgaran Warfarin Sodium  Buff-a-Comp Enoxaparin Orudis Zorpin  Buff-a-Comp with Codeine Equegesic Os-Cal-Gesic   Buffaprin Excedrin plain, buffered or Extra Strength Oxalid   Bufferin Arthritis Strength Feldene Oxphenbutazone   Bufferin plain or Extra Strength Feldene Capsules Oxycodone with Aspirin   Bufferin with Codeine Fenoprofen Fenoprofen Pabalate or Pabalate-SF   Buffets II Flogesic Panagesic   Buffinol plain or Extra Strength Florinal or Florinal with Codeine Panwarfarin   Buf-Tabs Flurbiprofen Penicillamine   Butalbital Compound Four-way cold tablets Penicillin   Butazolidin Fragmin Pepto-Bismol   Carbenicillin Geminisyn Percodan   Carna Arthritis Reliever Geopen Persantine   Carprofen Gold's salt Persistin   Chloramphenicol Goody's Phenylbutazone   Chloromycetin Haltrain Piroxlcam   Clmetidine heparin Plaquenil   Cllnoril Hyco-pap Ponstel   Clofibrate Hydroxy chloroquine Propoxyphen         Before stopping  any of these medications, be sure to consult the physician who ordered them.  Some, such as Coumadin (Warfarin) are ordered to prevent or treat serious conditions such as "deep thrombosis", "pumonary embolisms", and other heart problems.  The amount of time that you may need off of the medication may also vary with the medication and the reason for which you were taking it.  If you are taking any of these medications, please make sure you notify your pain physician before you undergo any procedures.

## 2017-11-07 NOTE — Progress Notes (Signed)
Safety precautions to be maintained throughout the outpatient stay will include: orient to surroundings, keep bed in low position, maintain call bell within reach at all times, provide assistance with transfer out of bed and ambulation.  

## 2017-11-07 NOTE — Patient Instructions (Addendum)
Stop taking Celebrex.  Diclofenac has been escribed to your pharmacy.   Moderate Conscious Sedation, Adult Sedation is the use of medicines to promote relaxation and relieve discomfort and anxiety. Moderate conscious sedation is a type of sedation. Under moderate conscious sedation, you are less alert than normal, but you are still able to respond to instructions, touch, or both. Moderate conscious sedation is used during short medical and dental procedures. It is milder than deep sedation, which is a type of sedation under which you cannot be easily woken up. It is also milder than general anesthesia, which is the use of medicines to make you unconscious. Moderate conscious sedation allows you to return to your regular activities sooner. Tell a health care provider about:  Any allergies you have.  All medicines you are taking, including vitamins, herbs, eye drops, creams, and over-the-counter medicines.  Use of steroids (by mouth or creams).  Any problems you or family members have had with sedatives and anesthetic medicines.  Any blood disorders you have.  Any surgeries you have had.  Any medical conditions you have, such as sleep apnea.  Whether you are pregnant or may be pregnant.  Any use of cigarettes, alcohol, marijuana, or street drugs. What are the risks? Generally, this is a safe procedure. However, problems may occur, including:  Getting too much medicine (oversedation).  Nausea.  Allergic reaction to medicines.  Trouble breathing. If this happens, a breathing tube may be used to help with breathing. It will be removed when you are awake and breathing on your own.  Heart trouble.  Lung trouble.  What happens before the procedure? Staying hydrated Follow instructions from your health care provider about hydration, which may include:  Up to 2 hours before the procedure - you may continue to drink clear liquids, such as water, clear fruit juice, black coffee, and  plain tea.  Eating and drinking restrictions Follow instructions from your health care provider about eating and drinking, which may include:  8 hours before the procedure - stop eating heavy meals or foods such as meat, fried foods, or fatty foods.  6 hours before the procedure - stop eating light meals or foods, such as toast or cereal.  6 hours before the procedure - stop drinking milk or drinks that contain milk.  2 hours before the procedure - stop drinking clear liquids.  Medicine  Ask your health care provider about:  Changing or stopping your regular medicines. This is especially important if you are taking diabetes medicines or blood thinners.  Taking medicines such as aspirin and ibuprofen. These medicines can thin your blood. Do not take these medicines before your procedure if your health care provider instructs you not to.  Tests and exams  You will have a physical exam.  You may have blood tests done to show: ? How well your kidneys and liver are working. ? How well your blood can clot. General instructions  Plan to have someone take you home from the hospital or clinic.  If you will be going home right after the procedure, plan to have someone with you for 24 hours. What happens during the procedure?  An IV tube will be inserted into one of your veins.  Medicine to help you relax (sedative) will be given through the IV tube.  The medical or dental procedure will be performed. What happens after the procedure?  Your blood pressure, heart rate, breathing rate, and blood oxygen level will be monitored often until the medicines  you were given have worn off.  Do not drive for 24 hours. This information is not intended to replace advice given to you by your health care provider. Make sure you discuss any questions you have with your health care provider. Document Released: 11/22/2000 Document Revised: 08/03/2015 Document Reviewed: 06/19/2015 Elsevier  Interactive Patient Education  2018 [employer]. GENERAL RISKS AND COMPLICATIONS  What are the risk, side effects and possible complications? Generally speaking, most procedures are safe.  However, with any procedure there are risks, side effects, and the possibility of complications.  The risks and complications are dependent upon the sites that are lesioned, or the type of nerve block to be performed.  The closer the procedure is to the spine, the more serious the risks are.  Great care is taken when placing the radio frequency needles, block needles or lesioning probes, but sometimes complications can occur. 1. Infection: Any time there is an injection through the skin, there is a risk of infection.  This is why sterile conditions are used for these blocks.  There are four possible types of infection. 1. Localized skin infection. 2. Central Nervous System Infection-This can be in the form of Meningitis, which can be deadly. 3. Epidural Infections-This can be in the form of an epidural abscess, which can cause pressure inside of the spine, causing compression of the spinal cord with subsequent paralysis. This would require an emergency surgery to decompress, and there are no guarantees that the patient would recover from the paralysis. 4. Discitis-This is an infection of the intervertebral discs.  It occurs in about 1% of discography procedures.  It is difficult to treat and it may lead to surgery.        2. Pain: the needles have to go through skin and soft tissues, will cause soreness.       3. Damage to internal structures:  The nerves to be lesioned may be near blood vessels or    other nerves which can be potentially damaged.       4. Bleeding: Bleeding is more common if the patient is taking blood thinners such as  aspirin, Coumadin, Ticiid, Plavix, etc., or if he/she have some genetic predisposition  such as hemophilia. Bleeding into the spinal canal can cause compression of the spinal  cord  with subsequent paralysis.  This would require an emergency surgery to  decompress and there are no guarantees that the patient would recover from the  paralysis.       5. Pneumothorax:  Puncturing of a lung is a possibility, every time a needle is introduced in  the area of the chest or upper back.  Pneumothorax refers to free air around the  collapsed lung(s), inside of the thoracic cavity (chest cavity).  Another two possible  complications related to a similar event would include: Hemothorax and Chylothorax.   These are variations of the Pneumothorax, where instead of air around the collapsed  lung(s), you may have blood or chyle, respectively.       6. Spinal headaches: They may occur with any procedures in the area of the spine.       7. Persistent CSF (Cerebro-Spinal Fluid) leakage: This is a rare problem, but may occur  with prolonged intrathecal or epidural catheters either due to the formation of a fistulous  track or a dural tear.       8. Nerve damage: By working so close to the spinal cord, there is always a possibility of  nerve  damage, which could be as serious as a permanent spinal cord injury with  paralysis.       9. Death:  Although rare, severe deadly allergic reactions known as "Anaphylactic  reaction" can occur to any of the medications used.      10. Worsening of the symptoms:  We can always make thing worse.  What are the chances of something like this happening? Chances of any of this occuring are extremely low.  By statistics, you have more of a chance of getting killed in a motor vehicle accident: while driving to the hospital than any of the above occurring .  Nevertheless, you should be aware that they are possibilities.  In general, it is similar to taking a shower.  Everybody knows that you can slip, hit your head and get killed.  Does that mean that you should not shower again?  Nevertheless always keep in mind that statistics do not mean anything if you happen to be on the  wrong side of them.  Even if a procedure has a 1 (one) in a 1,000,000 (million) chance of going wrong, it you happen to be that one..Also, keep in mind that by statistics, you have more of a chance of having something go wrong when taking medications.  Who should not have this procedure? If you are on a blood thinning medication (e.g. Coumadin, Plavix, see list of "Blood Thinners"), or if you have an active infection going on, you should not have the procedure.  If you are taking any blood thinners, please inform your physician.  How should I prepare for this procedure?  Do not eat or drink anything at least six hours prior to the procedure.  Bring a driver with you .  It cannot be a taxi.  Come accompanied by an adult that can drive you back, and that is strong enough to help you if your legs get weak or numb from the local anesthetic.  Take all of your medicines the morning of the procedure with just enough water to swallow them.  If you have diabetes, make sure that you are scheduled to have your procedure done first thing in the morning, whenever possible.  If you have diabetes, take only half of your insulin dose and notify our nurse that you have done so as soon as you arrive at the clinic.  If you are diabetic, but only take blood sugar pills (oral hypoglycemic), then do not take them on the morning of your procedure.  You may take them after you have had the procedure.  Do not take aspirin or any aspirin-containing medications, at least eleven (11) days prior to the procedure.  They may prolong bleeding.  Wear loose fitting clothing that may be easy to take off and that you would not mind if it got stained with Betadine or blood.  Do not wear any jewelry or perfume  Remove any nail coloring.  It will interfere with some of our monitoring equipment.  NOTE: Remember that this is not meant to be interpreted as a complete list of all possible complications.  Unforeseen problems may  occur.  BLOOD THINNERS The following drugs contain aspirin or other products, which can cause increased bleeding during surgery and should not be taken for 2 weeks prior to and 1 week after surgery.  If you should need take something for relief of minor pain, you may take acetaminophen which is found in Tylenol,m Datril, Anacin-3 and Panadol. It is not blood thinner.  The products listed below are.  Do not take any of the products listed below in addition to any listed on your instruction sheet.  A.P.C or A.P.C with Codeine Codeine Phosphate Capsules #3 Ibuprofen Ridaura  ABC compound Congesprin Imuran rimadil  Advil Cope Indocin Robaxisal  Alka-Seltzer Effervescent Pain Reliever and Antacid Coricidin or Coricidin-D  Indomethacin Rufen  Alka-Seltzer plus Cold Medicine Cosprin Ketoprofen S-A-C Tablets  Anacin Analgesic Tablets or Capsules Coumadin Korlgesic Salflex  Anacin Extra Strength Analgesic tablets or capsules CP-2 Tablets Lanoril Salicylate  Anaprox Cuprimine Capsules Levenox Salocol  Anexsia-D Dalteparin Magan Salsalate  Anodynos Darvon compound Magnesium Salicylate Sine-off  Ansaid Dasin Capsules Magsal Sodium Salicylate  Anturane Depen Capsules Marnal Soma  APF Arthritis pain formula Dewitt's Pills Measurin Stanback  Argesic Dia-Gesic Meclofenamic Sulfinpyrazone  Arthritis Bayer Timed Release Aspirin Diclofenac Meclomen Sulindac  Arthritis pain formula Anacin Dicumarol Medipren Supac  Analgesic (Safety coated) Arthralgen Diffunasal Mefanamic Suprofen  Arthritis Strength Bufferin Dihydrocodeine Mepro Compound Suprol  Arthropan liquid Dopirydamole Methcarbomol with Aspirin Synalgos  ASA tablets/Enseals Disalcid Micrainin Tagament  Ascriptin Doan's Midol Talwin  Ascriptin A/D Dolene Mobidin Tanderil  Ascriptin Extra Strength Dolobid Moblgesic Ticlid  Ascriptin with Codeine Doloprin or Doloprin with Codeine Momentum Tolectin  Asperbuf Duoprin Mono-gesic Trendar  Aspergum Duradyne  Motrin or Motrin IB Triminicin  Aspirin plain, buffered or enteric coated Durasal Myochrisine Trigesic  Aspirin Suppositories Easprin Nalfon Trillsate  Aspirin with Codeine Ecotrin Regular or Extra Strength Naprosyn Uracel  Atromid-S Efficin Naproxen Ursinus  Auranofin Capsules Elmiron Neocylate Vanquish  Axotal Emagrin Norgesic Verin  Azathioprine Empirin or Empirin with Codeine Normiflo Vitamin E  Azolid Emprazil Nuprin Voltaren  Bayer Aspirin plain, buffered or children's or timed BC Tablets or powders Encaprin Orgaran Warfarin Sodium  Buff-a-Comp Enoxaparin Orudis Zorpin  Buff-a-Comp with Codeine Equegesic Os-Cal-Gesic   Buffaprin Excedrin plain, buffered or Extra Strength Oxalid   Bufferin Arthritis Strength Feldene Oxphenbutazone   Bufferin plain or Extra Strength Feldene Capsules Oxycodone with Aspirin   Bufferin with Codeine Fenoprofen Fenoprofen Pabalate or Pabalate-SF   Buffets II Flogesic Panagesic   Buffinol plain or Extra Strength Florinal or Florinal with Codeine Panwarfarin   Buf-Tabs Flurbiprofen Penicillamine   Butalbital Compound Four-way cold tablets Penicillin   Butazolidin Fragmin Pepto-Bismol   Carbenicillin Geminisyn Percodan   Carna Arthritis Reliever Geopen Persantine   Carprofen Gold's salt Persistin   Chloramphenicol Goody's Phenylbutazone   Chloromycetin Haltrain Piroxlcam   Clmetidine heparin Plaquenil   Cllnoril Hyco-pap Ponstel   Clofibrate Hydroxy chloroquine Propoxyphen         Before stopping any of these medications, be sure to consult the physician who ordered them.  Some, such as Coumadin (Warfarin) are ordered to prevent or treat serious conditions such as "deep thrombosis", "pumonary embolisms", and other heart problems.  The amount of time that you may need off of the medication may also vary with the medication and the reason for which you were taking it.  If you are taking any of these medications, please make sure you notify your pain  physician before you undergo any procedures.

## 2017-11-09 DIAGNOSIS — F988 Other specified behavioral and emotional disorders with onset usually occurring in childhood and adolescence: Secondary | ICD-10-CM | POA: Insufficient documentation

## 2017-11-09 DIAGNOSIS — F329 Major depressive disorder, single episode, unspecified: Secondary | ICD-10-CM | POA: Insufficient documentation

## 2017-11-09 DIAGNOSIS — M25562 Pain in left knee: Secondary | ICD-10-CM | POA: Insufficient documentation

## 2017-11-09 DIAGNOSIS — M25561 Pain in right knee: Secondary | ICD-10-CM | POA: Insufficient documentation

## 2017-11-09 DIAGNOSIS — G8929 Other chronic pain: Secondary | ICD-10-CM | POA: Insufficient documentation

## 2017-11-09 DIAGNOSIS — J45909 Unspecified asthma, uncomplicated: Secondary | ICD-10-CM | POA: Insufficient documentation

## 2017-11-09 DIAGNOSIS — G894 Chronic pain syndrome: Secondary | ICD-10-CM | POA: Insufficient documentation

## 2017-11-09 DIAGNOSIS — E039 Hypothyroidism, unspecified: Secondary | ICD-10-CM | POA: Insufficient documentation

## 2017-11-09 DIAGNOSIS — M170 Bilateral primary osteoarthritis of knee: Secondary | ICD-10-CM | POA: Insufficient documentation

## 2017-11-09 DIAGNOSIS — E119 Type 2 diabetes mellitus without complications: Secondary | ICD-10-CM | POA: Insufficient documentation

## 2017-11-09 DIAGNOSIS — F32A Depression, unspecified: Secondary | ICD-10-CM | POA: Insufficient documentation

## 2017-11-13 ENCOUNTER — Ambulatory Visit: Payer: Managed Care, Other (non HMO)

## 2017-11-22 ENCOUNTER — Telehealth: Payer: Self-pay | Admitting: *Deleted

## 2017-11-23 ENCOUNTER — Ambulatory Visit: Payer: Managed Care, Other (non HMO)

## 2017-11-26 ENCOUNTER — Ambulatory Visit (HOSPITAL_BASED_OUTPATIENT_CLINIC_OR_DEPARTMENT_OTHER): Payer: Managed Care, Other (non HMO) | Admitting: Student in an Organized Health Care Education/Training Program

## 2017-11-26 ENCOUNTER — Other Ambulatory Visit: Payer: Self-pay

## 2017-11-26 ENCOUNTER — Encounter: Payer: Self-pay | Admitting: Student in an Organized Health Care Education/Training Program

## 2017-11-26 ENCOUNTER — Ambulatory Visit
Admission: RE | Admit: 2017-11-26 | Discharge: 2017-11-26 | Disposition: A | Discharge status: Home / Self Care | Payer: Managed Care, Other (non HMO) | Source: Ambulatory Visit | Attending: Student in an Organized Health Care Education/Training Program | Admitting: Student in an Organized Health Care Education/Training Program

## 2017-11-26 DIAGNOSIS — M25562 Pain in left knee: Secondary | ICD-10-CM | POA: Diagnosis present

## 2017-11-26 DIAGNOSIS — Z79899 Other long term (current) drug therapy: Secondary | ICD-10-CM | POA: Insufficient documentation

## 2017-11-26 DIAGNOSIS — Z7984 Long term (current) use of oral hypoglycemic drugs: Secondary | ICD-10-CM | POA: Insufficient documentation

## 2017-11-26 DIAGNOSIS — Z79890 Hormone replacement therapy (postmenopausal): Secondary | ICD-10-CM | POA: Diagnosis not present

## 2017-11-26 DIAGNOSIS — F419 Anxiety disorder, unspecified: Secondary | ICD-10-CM | POA: Insufficient documentation

## 2017-11-26 DIAGNOSIS — Z880 Allergy status to penicillin: Secondary | ICD-10-CM | POA: Insufficient documentation

## 2017-11-26 DIAGNOSIS — M170 Bilateral primary osteoarthritis of knee: Secondary | ICD-10-CM | POA: Insufficient documentation

## 2017-11-26 DIAGNOSIS — Z885 Allergy status to narcotic agent status: Secondary | ICD-10-CM | POA: Insufficient documentation

## 2017-11-26 MED ORDER — LACTATED RINGERS IV SOLN: 1000.0000 mL | Freq: Once | INTRAVENOUS | Status: DC

## 2017-11-26 MED ORDER — DEXAMETHASONE SODIUM PHOSPHATE 10 MG/ML IJ SOLN
INTRAMUSCULAR | Status: AC
  Filled 2017-11-26: qty 1

## 2017-11-26 MED ORDER — DEXAMETHASONE SODIUM PHOSPHATE 10 MG/ML IJ SOLN
10.0000 mg | Freq: Once | INTRAMUSCULAR | Status: AC
  Administered 2017-11-26: 10 mg

## 2017-11-26 MED ORDER — ROPIVACAINE HCL 2 MG/ML IJ SOLN
10.0000 mL | Freq: Once | INTRAMUSCULAR | Status: AC
  Administered 2017-11-26: 10 mL

## 2017-11-26 MED ORDER — LIDOCAINE HCL 2 % IJ SOLN
20.0000 mL | Freq: Once | INTRAMUSCULAR | Status: AC
  Administered 2017-11-26: 400 mg

## 2017-11-26 MED ORDER — ROPIVACAINE HCL 2 MG/ML IJ SOLN
INTRAMUSCULAR | Status: AC
  Filled 2017-11-26: qty 10

## 2017-11-26 MED ORDER — FENTANYL CITRATE (PF) 100 MCG/2ML IJ SOLN
25.0000 ug | INTRAMUSCULAR | Status: DC | PRN
  Administered 2017-11-26: 25 ug via INTRAVENOUS

## 2017-11-26 MED ORDER — FENTANYL CITRATE (PF) 100 MCG/2ML IJ SOLN
INTRAMUSCULAR | Status: AC
  Filled 2017-11-26: qty 2

## 2017-11-26 MED ORDER — LIDOCAINE HCL 2 % IJ SOLN
INTRAMUSCULAR | Status: AC
  Filled 2017-11-26: qty 20

## 2017-11-26 NOTE — Patient Instructions (Signed)

## 2017-11-26 NOTE — Progress Notes (Signed)
Patient's Name: Victoria Patterson  MRN: [id number]  Referring Provider: Edward Jolly, MD  DOB: 09/15/1960  PCP: Marguarite Arbour, MD  DOS: 11/26/2017  Note by: Edward Jolly, MD  Service setting: Ambulatory outpatient  Specialty: Interventional Pain Management  Patient type: Established  Location: [facility] (AMB) Pain Management Facility  Visit type: Interventional Procedure   Primary Reason for Visit: Interventional Pain Management Treatment. CC: Knee Pain (bilateral, left is worse)  Procedure:          Anesthesia, Analgesia, Anxiolysis:  Type: Genicular Nerves Block (Superior-lateral, Superior-medial, and Inferior-medial Genicular Nerves) #1  CPT: 64450      Primary Purpose: Diagnostic Region: Lateral, Anterior, and Medial aspects of the knee joint, above and below the knee joint proper. Level: Superior and inferior to the knee joint. Target Area: For Genicular Nerve block(s), the targets are: the superior-lateral genicular nerve, located in the lateral distal portion of the femoral shaft as it curves to form the lateral epicondyle, in the region of the distal femoral metaphysis; the superior-medial genicular nerve, located in the medial distal portion of the femoral shaft as it curves to form the medial epicondyle; and the inferior-medial genicular nerve, located in the medial, proximal portion of the tibial shaft, as it curves to form the medial epicondyle, in the region of the proximal tibial metaphysis. Approach: Anterior, percutaneous, ipsilateral approach. Laterality: Left knee Position: Modified Fowler's position with pillows under the targeted knee(s).  Type: Moderate (Conscious) Sedation combined with Local Anesthesia Indication(s): Analgesia and Anxiety Route: Intravenous (IV) IV Access: Secured Sedation: Meaningful verbal contact was maintained at all times during the procedure  Local Anesthetic: Lidocaine 1-2%   Indications: 1. Bilateral primary osteoarthritis of knee (L>>R)    Pain  Score: Pre-procedure: 3 /10 Post-procedure: 0-No pain/10  Pre-op Assessment:  Victoria Patterson is a 57 y.o. (year old), female patient, seen today for interventional treatment. She  has a past surgical history that includes Breast cyst aspiration (Left, 1992). Victoria Patterson has a current medication list which includes the following prescription(s): bisoprolol-hydrochlorothiazide, diclofenac, duloxetine, glimepiride, lamotrigine, levothyroxine, metformin, lisdexamfetamine, and lorazepam, and the following Facility-Administered Medications: fentanyl and lactated ringers. Her primarily concern today is the Knee Pain (bilateral, left is worse)  Initial Vital Signs:  Pulse/HCG Rate: 65ECG Heart Rate: (!) 59 Temp: 98.4 F (36.9 C) Resp: 16 BP: 120/80 SpO2: 99 %  BMI: Estimated body mass index is 39.65 kg/m as calculated from the following:   Height as of this encounter: 5\' 4"  (1.626 m).   Weight as of this encounter: 231 lb (104.8 kg).  Risk Assessment: Allergies: Reviewed. She is allergic to penicillins and stadol [butorphanol].  Allergy Precautions: None required Coagulopathies: Reviewed. None identified.  Blood-thinner therapy: None at this time Active Infection(s): Reviewed. None identified. Victoria Patterson is afebrile  Site Confirmation: Victoria Patterson was asked to confirm the procedure and laterality before marking the site Procedure checklist: Completed Consent: Before the procedure and under the influence of no sedative(s), amnesic(s), or anxiolytics, the patient was informed of the treatment options, risks and possible complications. To fulfill our ethical and legal obligations, as recommended by the American Medical Association's Code of Ethics, I have informed the patient of my clinical impression; the nature and purpose of the treatment or procedure; the risks, benefits, and possible complications of the intervention; the alternatives, including doing nothing; the risk(s) and benefit(s) of the  alternative treatment(s) or procedure(s); and the risk(s) and benefit(s) of doing nothing. The patient was provided information about the  general risks and possible complications associated with the procedure. These may include, but are not limited to: failure to achieve desired goals, infection, bleeding, organ or nerve damage, allergic reactions, paralysis, and death. In addition, the patient was informed of those risks and complications associated to the procedure, such as failure to decrease pain; infection; bleeding; organ or nerve damage with subsequent damage to sensory, motor, and/or autonomic systems, resulting in permanent pain, numbness, and/or weakness of one or several areas of the body; allergic reactions; (i.e.: anaphylactic reaction); and/or death. Furthermore, the patient was informed of those risks and complications associated with the medications. These include, but are not limited to: allergic reactions (i.e.: anaphylactic or anaphylactoid reaction(s)); adrenal axis suppression; blood sugar elevation that in diabetics may result in ketoacidosis or comma; water retention that in patients with history of congestive heart failure may result in shortness of breath, pulmonary edema, and decompensation with resultant heart failure; weight gain; swelling or edema; medication-induced neural toxicity; particulate matter embolism and blood vessel occlusion with resultant organ, and/or nervous system infarction; and/or aseptic necrosis of one or more joints. Finally, the patient was informed that Medicine is not an exact science; therefore, there is also the possibility of unforeseen or unpredictable risks and/or possible complications that may result in a catastrophic outcome. The patient indicated having understood very clearly. We have given the patient no guarantees and we have made no promises. Enough time was given to the patient to ask questions, all of which were answered to the patient's  satisfaction. Victoria Patterson has indicated that she wanted to continue with the procedure. Attestation: I, the ordering provider, attest that I have discussed with the patient the benefits, risks, side-effects, alternatives, likelihood of achieving goals, and potential problems during recovery for the procedure that I have provided informed consent. Date  Time: 11/26/2017 11:18 AM  Pre-Procedure Preparation:  Monitoring: As per clinic protocol. Respiration, ETCO2, SpO2, BP, heart rate and rhythm monitor placed and checked for adequate function Safety Precautions: Patient was assessed for positional comfort and pressure points before starting the procedure. Time-out: I initiated and conducted the "Time-out" before starting the procedure, as per protocol. The patient was asked to participate by confirming the accuracy of the "Time Out" information. Verification of the correct person, site, and procedure were performed and confirmed by me, the nursing staff, and the patient. "Time-out" conducted as per Joint Commission's Universal Protocol (UP.01.01.01). Time: 1247  Description of Procedure:          Area Prepped: Entire knee area, from mid-thigh to mid-shin, lateral, anterior, and medial aspects. Prepping solution: ChloraPrep (2% chlorhexidine gluconate and 70% isopropyl alcohol) Safety Precautions: Aspiration looking for blood return was conducted prior to all injections. At no point did we inject any substances, as a needle was being advanced. No attempts were made at seeking any paresthesias. Safe injection practices and needle disposal techniques used. Medications properly checked for expiration dates. SDV (single dose vial) medications used. Latex Allergy precautions taken.   Description of the Procedure: Protocol guidelines were followed. The patient was placed in position over the procedure table. The target area was identified and the area prepped in the usual manner. Skin & deeper tissues  infiltrated with local anesthetic. Appropriate amount of time allowed to pass for local anesthetics to take effect. The procedure needles were then advanced to the target area. Proper needle placement secured. Negative aspiration confirmed. Solution injected in intermittent fashion, asking for systemic symptoms every 0.5cc of injectate. The needles were then  removed and the area cleansed, making sure to leave some of the prepping solution back to take advantage of its long term bactericidal properties.  Vitals:   11/26/17 1253 11/26/17 1303 11/26/17 1313 11/26/17 1323  BP: 138/87 140/77 (!) 151/85 (!) 143/89  Pulse:      Resp: 15 16 18 18   Temp:   98.5 F (36.9 C) 98.5 F (36.9 C)  TempSrc:   Temporal Temporal  SpO2: 96% 97% 99% 99%  Weight:      Height:        Start Time: 1247 hrs. End Time: 1252 hrs. Materials:  Needle(s) Type: Spinal Needle Gauge: 22G Length: 3.5-in Medication(s): Please see orders for medications and dosing details. 5 cc solution made of 4 cc of 0.2% ropivacaine, 1 cc of Decadron 10 mg/cc.  1.5 cc injected at each level above. Imaging Guidance (Non-Spinal):          Type of Imaging Technique: Fluoroscopy Guidance (Non-Spinal) Indication(s): Assistance in needle guidance and placement for procedures requiring needle placement in or near specific anatomical locations not easily accessible without such assistance. Exposure Time: Please see nurses notes. Contrast: Before injecting any contrast, we confirmed that the patient did not have an allergy to iodine, shellfish, or radiological contrast. Once satisfactory needle placement was completed at the desired level, radiological contrast was injected. Contrast injected under live fluoroscopy. No contrast complications. See chart for type and volume of contrast used. Fluoroscopic Guidance: I was personally present during the use of fluoroscopy. "Tunnel Vision Technique" used to obtain the best possible view of the target  area. Parallax error corrected before commencing the procedure. "Direction-depth-direction" technique used to introduce the needle under continuous pulsed fluoroscopy. Once target was reached, antero-posterior, oblique, and lateral fluoroscopic projection used confirm needle placement in all planes. Images permanently stored in EMR. Interpretation: I personally interpreted the imaging intraoperatively. Adequate needle placement confirmed in multiple planes. Appropriate spread of contrast into desired area was observed. No evidence of afferent or efferent intravascular uptake. Permanent images saved into the patient's record.  Antibiotic Prophylaxis:   Anti-infectives (From admission, onward)   None     Indication(s): None identified  Post-operative Assessment:  Post-procedure Vital Signs:  Pulse/HCG Rate: 6564 Temp: 98.5 F (36.9 C) Resp: 18 BP: (!) 143/89 SpO2: 99 %  EBL: None  Complications: No immediate post-treatment complications observed by team, or reported by patient.  Note: The patient tolerated the entire procedure well. A repeat set of vitals were taken after the procedure and the patient was kept under observation following institutional policy, for this type of procedure. Post-procedural neurological assessment was performed, showing return to baseline, prior to discharge. The patient was provided with post-procedure discharge instructions, including a section on how to identify potential problems. Should any problems arise concerning this procedure, the patient was given instructions to immediately contact us, at any time, without hesitation. In any case, we plan to contact the patient by telephone for a follow-up status report regarding this interventional procedure.  Comments:  No additional relevant information. 5 out of 5 strength bilateral lower extremity: Plantar flexion, dorsiflexion, knee flexion, knee extension.  Plan of Care   Imaging Orders     DG C-Arm 1-60  Min-No Report Procedure Orders    No procedure(s) ordered today    Medications ordered for procedure: Meds ordered this encounter  Medications  . lactated ringers infusion 1,000 mL  . fentaNYL (SUBLIMAZE) injection 25-100 mcg    Make sure Narcan is available in the  pyxis when using this medication. In the event of respiratory depression (RR< 8/min): Titrate NARCAN (naloxone) in increments of 0.1 to 0.2 mg IV at 2-3 minute intervals, until desired degree of reversal.  . ropivacaine (PF) 2 mg/mL (0.2%) (NAROPIN) injection 10 mL  . lidocaine (XYLOCAINE) 2 % (with pres) injection 400 mg  . dexamethasone (DECADRON) injection 10 mg   Medications administered: We administered fentaNYL, ropivacaine (PF) 2 mg/mL (0.2%), lidocaine, and dexamethasone.  See the medical record for exact dosing, route, and time of administration.  New Prescriptions   No medications on file   Disposition: Discharge home  Discharge Date & Time: 11/26/2017; 1326 hrs.   Physician-requested Follow-up: Return in about 5 weeks (around 12/31/2017) for Post Procedure Evaluation.  Future Appointments  Date Time Provider Department Center  12/04/2017 11:00 AM [facility]  12/31/2017  2:15 PM Edward JollyLateef, Luvern Mischke, MD [facility] None   Primary Care Physician: Marguarite ArbourSparks, Jeffrey D, MD [facility] Note by: Edward JollyBilal Dresden Ament, MD Date: 11/26/2017; Time: 1:56 PM  Disclaimer:  Medicine is not an exact science. The only guarantee in medicine is that nothing is guaranteed. It is important to note that the decision to proceed with this intervention was based on the information collected from the patient. The Data and conclusions were drawn from the patient's questionnaire, the interview, and the physical examination. Because the information was provided in large part by the patient, it cannot be guaranteed that it has not been purposely or unconsciously manipulated. Every effort has been made to  obtain as much relevant data as possible for this evaluation. It is important to note that the conclusions that lead to this procedure are derived in large part from the available data. Always take into account that the treatment will also be dependent on availability of resources and existing treatment guidelines, considered by other Pain Management Practitioners as being common knowledge and practice, at the time of the intervention. For Medico-Legal purposes, it is also important to point out that variation in procedural techniques and pharmacological choices are the acceptable norm. The indications, contraindications, technique, and results of the above procedure should only be interpreted and judged by a Board-Certified Interventional Pain Specialist with extensive familiarity and expertise in the same exact procedure and technique.

## 2017-11-26 NOTE — Progress Notes (Signed)
Safety precautions to be maintained throughout the outpatient stay will include: orient to surroundings, keep bed in low position, maintain call bell within reach at all times, provide assistance with transfer out of bed and ambulation.  

## 2017-11-27 ENCOUNTER — Ambulatory Visit: Payer: Self-pay | Admitting: Student in an Organized Health Care Education/Training Program

## 2017-11-27 NOTE — Telephone Encounter (Signed)
Post procedure phone call.  LM 

## 2017-12-04 ENCOUNTER — Ambulatory Visit: Payer: Managed Care, Other (non HMO)

## 2017-12-11 ENCOUNTER — Ambulatory Visit
Admission: RE | Admit: 2017-12-11 | Discharge: 2017-12-11 | Disposition: A | Discharge status: Home / Self Care | Payer: Managed Care, Other (non HMO) | Source: Ambulatory Visit | Attending: Internal Medicine | Admitting: Internal Medicine

## 2017-12-12 ENCOUNTER — Telehealth: Payer: Self-pay

## 2017-12-12 NOTE — Telephone Encounter (Signed)
Pt Called and want's to know if she can come earlier than 10/14 , the procedure she had worked great so she want's to get the next one scheduled, If she needs follow up that fine, she wants to be seen earlier .

## 2017-12-13 NOTE — Telephone Encounter (Signed)
If I have an opening next week that is fine. Thanks

## 2017-12-14 ENCOUNTER — Telehealth: Payer: Self-pay | Admitting: *Deleted

## 2017-12-14 NOTE — Telephone Encounter (Signed)
Voicemail left with patient to see exactly what patient is needing.  She does have a f/up appt on 12/31/17 from last procedure.  York Spaniel that we would address this on Monday.

## 2017-12-31 ENCOUNTER — Encounter: Payer: Self-pay | Admitting: Student in an Organized Health Care Education/Training Program

## 2017-12-31 ENCOUNTER — Ambulatory Visit
Payer: Managed Care, Other (non HMO) | Attending: Student in an Organized Health Care Education/Training Program | Admitting: Student in an Organized Health Care Education/Training Program

## 2017-12-31 VITALS — BP 157/92 | HR 66 | Temp 98.1°F | Resp 16 | Ht 64.0 in | Wt 220.0 lb

## 2017-12-31 DIAGNOSIS — Z818 Family history of other mental and behavioral disorders: Secondary | ICD-10-CM | POA: Insufficient documentation

## 2017-12-31 DIAGNOSIS — M25561 Pain in right knee: Secondary | ICD-10-CM | POA: Diagnosis not present

## 2017-12-31 DIAGNOSIS — M170 Bilateral primary osteoarthritis of knee: Secondary | ICD-10-CM | POA: Diagnosis not present

## 2017-12-31 DIAGNOSIS — F419 Anxiety disorder, unspecified: Secondary | ICD-10-CM | POA: Insufficient documentation

## 2017-12-31 DIAGNOSIS — E039 Hypothyroidism, unspecified: Secondary | ICD-10-CM | POA: Insufficient documentation

## 2017-12-31 DIAGNOSIS — F329 Major depressive disorder, single episode, unspecified: Secondary | ICD-10-CM | POA: Insufficient documentation

## 2017-12-31 DIAGNOSIS — F909 Attention-deficit hyperactivity disorder, unspecified type: Secondary | ICD-10-CM | POA: Diagnosis not present

## 2017-12-31 DIAGNOSIS — Z7984 Long term (current) use of oral hypoglycemic drugs: Secondary | ICD-10-CM | POA: Diagnosis not present

## 2017-12-31 DIAGNOSIS — Z880 Allergy status to penicillin: Secondary | ICD-10-CM | POA: Insufficient documentation

## 2017-12-31 DIAGNOSIS — E119 Type 2 diabetes mellitus without complications: Secondary | ICD-10-CM | POA: Diagnosis not present

## 2017-12-31 DIAGNOSIS — J45909 Unspecified asthma, uncomplicated: Secondary | ICD-10-CM | POA: Insufficient documentation

## 2017-12-31 DIAGNOSIS — G894 Chronic pain syndrome: Secondary | ICD-10-CM | POA: Diagnosis not present

## 2017-12-31 DIAGNOSIS — Z79890 Hormone replacement therapy (postmenopausal): Secondary | ICD-10-CM | POA: Insufficient documentation

## 2017-12-31 DIAGNOSIS — Z79899 Other long term (current) drug therapy: Secondary | ICD-10-CM | POA: Diagnosis not present

## 2017-12-31 DIAGNOSIS — G8929 Other chronic pain: Secondary | ICD-10-CM

## 2017-12-31 DIAGNOSIS — M25562 Pain in left knee: Secondary | ICD-10-CM | POA: Diagnosis not present

## 2017-12-31 DIAGNOSIS — F429 Obsessive-compulsive disorder, unspecified: Secondary | ICD-10-CM | POA: Insufficient documentation

## 2017-12-31 DIAGNOSIS — Z885 Allergy status to narcotic agent status: Secondary | ICD-10-CM | POA: Insufficient documentation

## 2017-12-31 MED ORDER — DICLOFENAC SODIUM 75 MG PO TBEC: 75.0000 mg | DELAYED_RELEASE_TABLET | Freq: Two times a day (BID) | ORAL | 2 refills | Status: DC

## 2017-12-31 NOTE — Progress Notes (Signed)
Patient's Name: Victoria Patterson  MRN: [id number]  Referring Provider: Marguarite Arbour, MD  DOB: September 24, 1960  PCP: Marguarite Arbour, MD  DOS: 12/31/2017  Note by: Edward Jolly, MD  Service setting: Ambulatory outpatient  Specialty: Interventional Pain Management  Location: [facility] (AMB) Pain Management Facility    Patient type: Established   Primary Reason(s) for Visit: Encounter for post-procedure evaluation of chronic illness with mild to moderate exacerbation CC: Knee Pain (left last treated ) and Knee Pain (right)  HPI  [name] is a 57 y.o. year old, female patient, who comes today for a post-procedure evaluation. She has Bilateral primary osteoarthritis of knee; Chronic pain of both knees; Chronic pain syndrome; Arthralgia of both knees; Acquired hypothyroidism; ADD (attention deficit disorder); Asthma without status asthmaticus; Depression; and Diabetes mellitus type 2, uncomplicated (HCC) on their problem list. Her primarily concern today is the Knee Pain (left last treated ) and Knee Pain (right)  Pain Assessment: Location: (right knee as well ) Knee Radiating: denies Onset: More than a month ago Duration: Chronic pain Quality: Other (Comment)(left knee is improved which is what we treated.  right knee has some weakness on the outer aspect making it difficult to go up stairs. ) Severity: 0-No pain/10 (subjective, self-reported pain score)  Note: Reported level is compatible with observation.                         When using our objective Pain Scale, levels between 6 and 10/10 are said to belong in an emergency room, as it progressively worsens from a 6/10, described as severely limiting, requiring emergency care not usually available at an outpatient pain management facility. At a 6/10 level, communication becomes difficult and requires great effort. Assistance to reach the emergency department may be required. Facial flushing and profuse sweating along with potentially dangerous  increases in heart rate and blood pressure will be evident. Effect on ADL: right knee pain affects gait when going up stairs.  Timing: Constant Modifying factors: procedure helped left knee.  BP: (!) 157/92  HR: 66  [name] comes in today for post-procedure evaluation.  Further details on both, my assessment(s), as well as the proposed treatment plan, please see below.  Post-Procedure Assessment  11/26/2017 Procedure: Left knee genicular nerve block Pre-procedure pain score:  3/10 Post-procedure pain score: 0/10         Influential Factors: BMI: 37.76 kg/m Intra-procedural challenges: None observed.         Assessment challenges: None detected.              Reported side-effects: None.        Post-procedural adverse reactions or complications: None reported         Sedation: Please see nurses note. When no sedatives are used, the analgesic levels obtained are directly associated to the effectiveness of the local anesthetics. However, when sedation is provided, the level of analgesia obtained during the initial 1 hour following the intervention, is believed to be the result of a combination of factors. These factors may include, but are not limited to: 1. The effectiveness of the local anesthetics used. 2. The effects of the analgesic(s) and/or anxiolytic(s) used. 3. The degree of discomfort experienced by the patient at the time of the procedure. 4. The patients ability and reliability in recalling and recording the events. 5. The presence and influence of possible secondary gains and/or psychosocial factors. Reported result: Relief experienced during the 1st hour  after the procedure: 100 % (Ultra-Short Term Relief)            Interpretative annotation: Clinically appropriate result. Analgesia during this period is likely to be Local Anesthetic and/or IV Sedative (Analgesic/Anxiolytic) related.          Effects of local anesthetic: The analgesic effects attained during this period  are directly associated to the localized infiltration of local anesthetics and therefore cary significant diagnostic value as to the etiological location, or anatomical origin, of the pain. Expected duration of relief is directly dependent on the pharmacodynamics of the local anesthetic used. Long-acting (4-6 hours) anesthetics used.  Reported result: Relief during the next 4 to 6 hour after the procedure: 100 % (Short-Term Relief)            Interpretative annotation: Clinically appropriate result. Analgesia during this period is likely to be Local Anesthetic-related.          Long-term benefit: Defined as the period of time past the expected duration of local anesthetics (1 hour for short-acting and 4-6 hours for long-acting). With the possible exception of prolonged sympathetic blockade from the local anesthetics, benefits during this period are typically attributed to, or associated with, other factors such as analgesic sensory neuropraxia, antiinflammatory effects, or beneficial biochemical changes provided by agents other than the local anesthetics.  Reported result: Extended relief following procedure: 75 %(stabbing pain is gone, there is still some aching for which she takes diclofenac and 1000 mg tylenol for relief whiich does take the pain competely away. ) (Long-Term Relief)            Interpretative annotation: Clinically possible results. Good relief. No permanent benefit expected. Inflammation plays a part in the etiology to the pain.          Current benefits: Defined as reported results that persistent at this point in time.   Analgesia: >75 %            Function: [name] reports improvement in function ROM: [name] reports improvement in ROM Interpretative annotation: Ongoing benefit. Therapeutic benefit observed. Effective therapeutic approach.          Interpretation: Results would suggest a successful diagnostic and therapeutic intervention.                  Plan:  Set up  procedure as a PRN palliative treatment option for this patient.                 Meds   Current Outpatient Medications:  .  bisoprolol-hydrochlorothiazide (ZIAC) 5-6.25 MG tablet, Take 1 tablet by mouth daily., Disp: , Rfl:  .  diclofenac (VOLTAREN) 75 MG EC tablet, Take 1 tablet (75 mg total) by mouth 2 (two) times daily after a meal., Disp: 60 tablet, Rfl: 2 .  DULoxetine (CYMBALTA) 60 MG capsule, Take 60 mg by mouth daily., Disp: , Rfl:  .  ezetimibe (ZETIA) 10 MG tablet, Take 10 mg by mouth daily., Disp: , Rfl:  .  glimepiride (AMARYL) 2 MG tablet, Take 4 mg by mouth daily with breakfast. , Disp: , Rfl:  .  lamoTRIgine (LAMICTAL) 100 MG tablet, Take 100 mg by mouth daily., Disp: , Rfl:  .  levothyroxine (SYNTHROID, LEVOTHROID) 50 MCG tablet, Take 50 mcg by mouth daily before breakfast., Disp: , Rfl:  .  metFORMIN (GLUCOPHAGE) 1000 MG tablet, Take 500 mg by mouth 2 (two) times daily with a meal. , Disp: , Rfl:  .  LORazepam (ATIVAN) 0.5 MG  tablet, Take 0.5 mg by mouth every 8 (eight) hours., Disp: , Rfl:   ROS  Constitutional: Denies any fever or chills Gastrointestinal: No reported hemesis, hematochezia, vomiting, or acute GI distress Musculoskeletal: Denies any acute onset joint swelling, redness, loss of ROM, or weakness Neurological: No reported episodes of acute onset apraxia, aphasia, dysarthria, agnosia, amnesia, paralysis, loss of coordination, or loss of consciousness  Allergies  Ms. Escorcia is allergic to penicillins and stadol [butorphanol].  PFSH  Drug: Ms. Hubers  reports that she does not use drugs. Alcohol:  reports that she does not drink alcohol. Tobacco:  reports that she has never smoked. She has never used smokeless tobacco. Medical:  has a past medical history of ADHD (attention deficit hyperactivity disorder), Anxiety, Depression, Diabetes mellitus, type II (HCC), and Obsessive-compulsive disorder. Surgical: Ms. Massar  has a past surgical history that includes  Breast cyst aspiration (Left, 1992). Family: family history includes Alcohol abuse in her father; Bipolar disorder in her mother; Breast cancer in her cousin; Breast cancer (age of onset: 49) in her paternal aunt; Ovarian cancer (age of onset: 55) in her mother.  Constitutional Exam  General appearance: Well nourished, well developed, and well hydrated. In no apparent acute distress Vitals:   12/31/17 1430  BP: (!) 157/92  Pulse: 66  Resp: 16  Temp: 98.1 F (36.7 C)  TempSrc: Oral  SpO2: 98%  Weight: 220 lb (99.8 kg)  Height: 5\' 4"  (1.626 m)   BMI Assessment: Estimated body mass index is 37.76 kg/m as calculated from the following:   Height as of this encounter: 5\' 4"  (1.626 m).   Weight as of this encounter: 220 lb (99.8 kg).  BMI interpretation table: BMI level Category Range association with higher incidence of chronic pain  <18 kg/m2 Underweight   18.5-24.9 kg/m2 Ideal body weight   25-29.9 kg/m2 Overweight Increased incidence by 20%  30-34.9 kg/m2 Obese (Class I) Increased incidence by 68%  35-39.9 kg/m2 Severe obesity (Class II) Increased incidence by 136%  >40 kg/m2 Extreme obesity (Class III) Increased incidence by 254%   Patient's current BMI Ideal Body weight  Body mass index is 37.76 kg/m. Ideal body weight: 54.7 kg (120 lb 9.5 oz) Adjusted ideal body weight: 72.7 kg (160 lb 5.7 oz)   BMI Readings from Last 4 Encounters:  12/31/17 37.76 kg/m  11/26/17 39.65 kg/m  11/07/17 39.48 kg/m   Wt Readings from Last 4 Encounters:  12/31/17 220 lb (99.8 kg)  11/26/17 231 lb (104.8 kg)  11/07/17 230 lb (104.3 kg)  Psych/Mental status: Alert, oriented x 3 (person, place, & time)       Eyes: PERLA Respiratory: No evidence of acute respiratory distress  Cervical Spine Area Exam  Skin & Axial Inspection: No masses, redness, edema, swelling, or associated skin lesions Alignment: Symmetrical Functional ROM: Unrestricted ROM      Stability: No instability  detected Muscle Tone/Strength: Functionally intact. No obvious neuro-muscular anomalies detected. Sensory (Neurological): Unimpaired Palpation: No palpable anomalies              Upper Extremity (UE) Exam    Side: Right upper extremity  Side: Left upper extremity  Skin & Extremity Inspection: Skin color, temperature, and hair growth are WNL. No peripheral edema or cyanosis. No masses, redness, swelling, asymmetry, or associated skin lesions. No contractures.  Skin & Extremity Inspection: Skin color, temperature, and hair growth are WNL. No peripheral edema or cyanosis. No masses, redness, swelling, asymmetry, or associated skin lesions. No contractures.  Functional ROM: Unrestricted ROM          Functional ROM: Unrestricted ROM          Muscle Tone/Strength: Functionally intact. No obvious neuro-muscular anomalies detected.  Muscle Tone/Strength: Functionally intact. No obvious neuro-muscular anomalies detected.  Sensory (Neurological): Unimpaired          Sensory (Neurological): Unimpaired          Palpation: No palpable anomalies              Palpation: No palpable anomalies              Provocative Test(s):  Phalen's test: deferred Tinel's test: deferred Apley's scratch test (touch opposite shoulder):  Action 1 (Across chest): deferred Action 2 (Overhead): deferred Action 3 (LB reach): deferred   Provocative Test(s):  Phalen's test: deferred Tinel's test: deferred Apley's scratch test (touch opposite shoulder):  Action 1 (Across chest): deferred Action 2 (Overhead): deferred Action 3 (LB reach): deferred    Thoracic Spine Area Exam  Skin & Axial Inspection: No masses, redness, or swelling Alignment: Symmetrical Functional ROM: Unrestricted ROM Stability: No instability detected Muscle Tone/Strength: Functionally intact. No obvious neuro-muscular anomalies detected. Sensory (Neurological): Unimpaired Muscle strength & Tone: No palpable anomalies  Lumbar Spine Area Exam  Skin &  Axial Inspection: No masses, redness, or swelling Alignment: Symmetrical Functional ROM: Unrestricted ROM       Stability: No instability detected Muscle Tone/Strength: Functionally intact. No obvious neuro-muscular anomalies detected. Sensory (Neurological): Unimpaired Palpation: No palpable anomalies       Provocative Tests: Hyperextension/rotation test: deferred today       Lumbar quadrant test (Kemp's test): deferred today       Lateral bending test: deferred today       Patrick's Maneuver: deferred today                   FABER test: deferred today                   S-I anterior distraction/compression test: deferred today         S-I lateral compression test: deferred today         S-I Thigh-thrust test: deferred today         S-I Gaenslen's test: deferred today          Gait & Posture Assessment  Ambulation: Unassisted Gait: Relatively normal for age and body habitus Posture: WNL   Lower Extremity Exam    Side: Right lower extremity  Side: Left lower extremity  Stability: No instability observed          Stability: No instability observed          Skin & Extremity Inspection: Skin color, temperature, and hair growth are WNL. No peripheral edema or cyanosis. No masses, redness, swelling, asymmetry, or associated skin lesions. No contractures.  Skin & Extremity Inspection: Skin color, temperature, and hair growth are WNL. No peripheral edema or cyanosis. No masses, redness, swelling, asymmetry, or associated skin lesions. No contractures.  Functional ROM: Decreased ROM                  Functional ROM: Improved after treatment                  Muscle Tone/Strength: Functionally intact. No obvious neuro-muscular anomalies detected.  Muscle Tone/Strength: Functionally intact. No obvious neuro-muscular anomalies detected.  Sensory (Neurological): Unimpaired  Sensory (Neurological): Musculoskeletal pain pattern improved after treatment  Palpation: No palpable anomalies  Palpation: No  palpable anomalies   Assessment  Primary Diagnosis & Pertinent Problem List: The primary encounter diagnosis was Bilateral primary osteoarthritis of knee (L>>R). Diagnoses of Chronic pain of both knees (L>R, medial aspect), Chronic pain syndrome, and Arthralgia of both knees were also pertinent to this visit.  Status Diagnosis  Controlled Controlled Controlled 1. Bilateral primary osteoarthritis of knee (L>>R)   2. Chronic pain of both knees (L>R, medial aspect)   3. Chronic pain syndrome   4. Arthralgia of both knees     General Recommendations: The pain condition that the patient suffers from is best treated with a multidisciplinary approach that involves an increase in physical activity to prevent de-conditioning and worsening of the pain cycle, as well as psychological counseling (formal and/or informal) to address the co-morbid psychological affects of pain. Treatment will often involve judicious use of pain medications and interventional procedures to decrease the pain, allowing the patient to participate in the physical activity that will ultimately produce long-lasting pain reductions. The goal of the multidisciplinary approach is to return the patient to a higher level of overall function and to restore their ability to perform activities of daily living.  57 year old female with a history of bilateral knee osteoarthritis status post left knee genicular nerve block on 11/26/2017 who presents for follow-up.  Patient is endorsing significant pain relief as well as improvement in range of motion as well as functional status since having her left knee genicular nerve block.  She still having ongoing pain relief and states that on days that are bad, taking diclofenac eliminates all of her knee pain.  She is pleased with the results from the nerve block.  We will set this procedure up as a PRN procedure if and when patient has return of her left knee pain.  Plan: -PRN left knee genicular nerve  block #2 -Refill diclofenac as below  Plan of Care  Pharmacotherapy (Medications Ordered): Meds ordered this encounter  Medications  . diclofenac (VOLTAREN) 75 MG EC tablet    Sig: Take 1 tablet (75 mg total) by mouth 2 (two) times daily after a meal.    Dispense:  60 tablet    Refill:  2   Lab-work, procedure(s), and/or referral(s): Orders Placed This Encounter  Procedures  . GENICULAR NERVE BLOCK   Time Note: Greater than 50% of the 25 minute(s) of face-to-face time spent with Ms. Ryles, was spent in counseling/coordination of care regarding: Ms. Abate primary cause of pain, the treatment plan, treatment alternatives, the risks and possible complications of proposed treatment, going over the informed consent, the results, interpretation and significance of  her recent diagnostic interventional treatment(s), realistic expectations and the goals of pain management (increased in functionality).  Provider-requested follow-up: Return if symptoms worsen or fail to improve.  No future appointments.  Primary Care Physician: Marguarite Arbour, MD Location: [facility] Out[facility] Pain Management Facility Note by: Edward Jolly, M.D Date: 12/31/2017; Time: 3:44 PM  There are no Patient Instructions on file for this visit.

## 2017-12-31 NOTE — Progress Notes (Signed)
Safety precautions to be maintained throughout the outpatient stay will include: orient to surroundings, keep bed in low position, maintain call bell within reach at all times, provide assistance with transfer out of bed and ambulation.  

## 2018-01-07 ENCOUNTER — Telehealth: Payer: Self-pay | Admitting: Student in an Organized Health Care Education/Training Program

## 2018-01-07 ENCOUNTER — Other Ambulatory Visit: Payer: Self-pay | Admitting: Internal Medicine

## 2018-01-07 DIAGNOSIS — R1011 Right upper quadrant pain: Secondary | ICD-10-CM

## 2018-01-07 NOTE — Telephone Encounter (Signed)
Left voicemail with guidelines of which medications to take prior to procedure and which ones she would need to stop. Ask to please return the call if she has any additional questions.

## 2018-01-07 NOTE — Telephone Encounter (Signed)
Patient is scheduled for procedure on Wed 01-09-18 needs to verify what meds to stop

## 2018-01-08 ENCOUNTER — Other Ambulatory Visit: Payer: Self-pay | Admitting: Internal Medicine

## 2018-01-08 DIAGNOSIS — R0781 Pleurodynia: Secondary | ICD-10-CM

## 2018-01-09 ENCOUNTER — Encounter: Payer: Self-pay | Admitting: Student in an Organized Health Care Education/Training Program

## 2018-01-09 ENCOUNTER — Ambulatory Visit (HOSPITAL_BASED_OUTPATIENT_CLINIC_OR_DEPARTMENT_OTHER): Payer: Managed Care, Other (non HMO) | Admitting: Student in an Organized Health Care Education/Training Program

## 2018-01-09 ENCOUNTER — Ambulatory Visit
Admission: RE | Admit: 2018-01-09 | Discharge: 2018-01-09 | Disposition: A | Discharge status: Home / Self Care | Payer: Managed Care, Other (non HMO) | Source: Ambulatory Visit | Attending: Student in an Organized Health Care Education/Training Program | Admitting: Student in an Organized Health Care Education/Training Program

## 2018-01-09 ENCOUNTER — Other Ambulatory Visit: Payer: Self-pay

## 2018-01-09 VITALS — BP 144/82 | HR 63 | Temp 99.0°F | Resp 19 | Ht 64.0 in | Wt 225.0 lb

## 2018-01-09 DIAGNOSIS — Z880 Allergy status to penicillin: Secondary | ICD-10-CM | POA: Diagnosis not present

## 2018-01-09 DIAGNOSIS — M25562 Pain in left knee: Secondary | ICD-10-CM

## 2018-01-09 DIAGNOSIS — M170 Bilateral primary osteoarthritis of knee: Secondary | ICD-10-CM

## 2018-01-09 DIAGNOSIS — G8929 Other chronic pain: Secondary | ICD-10-CM | POA: Insufficient documentation

## 2018-01-09 DIAGNOSIS — M25561 Pain in right knee: Secondary | ICD-10-CM

## 2018-01-09 DIAGNOSIS — Z885 Allergy status to narcotic agent status: Secondary | ICD-10-CM | POA: Diagnosis not present

## 2018-01-09 MED ORDER — DEXAMETHASONE SODIUM PHOSPHATE 10 MG/ML IJ SOLN
10.0000 mg | Freq: Once | INTRAMUSCULAR | Status: AC
  Administered 2018-01-09: 10 mg
  Filled 2018-01-09: qty 1

## 2018-01-09 MED ORDER — LIDOCAINE HCL 2 % IJ SOLN
20.0000 mL | Freq: Once | INTRAMUSCULAR | Status: AC
  Administered 2018-01-09: 400 mg
  Filled 2018-01-09: qty 40

## 2018-01-09 MED ORDER — FENTANYL CITRATE (PF) 100 MCG/2ML IJ SOLN
25.0000 ug | INTRAMUSCULAR | Status: DC | PRN
  Administered 2018-01-09: 100 ug via INTRAVENOUS
  Filled 2018-01-09: qty 2

## 2018-01-09 MED ORDER — ROPIVACAINE HCL 2 MG/ML IJ SOLN
10.0000 mL | Freq: Once | INTRAMUSCULAR | Status: AC
  Administered 2018-01-09: 10 mL
  Filled 2018-01-09: qty 10

## 2018-01-09 MED ORDER — LACTATED RINGERS IV SOLN
1000.0000 mL | Freq: Once | INTRAVENOUS | Status: AC
Start: 2018-01-09 — End: 2018-01-09
  Administered 2018-01-09: 1000 mL via INTRAVENOUS

## 2018-01-09 NOTE — Progress Notes (Signed)
Patient's Name: Victoria Patterson  MRN: [id number]  Referring Provider: Marguarite Arbour, MD  DOB: [birth year]  PCP: Marguarite Arbour, MD  DOS: 01/09/2018  Note by: Edward Jolly, MD  Service setting: Ambulatory outpatient  Specialty: Interventional Pain Management  Patient type: Established  Location: [facility] (AMB) Pain Management Facility  Visit type: Interventional Procedure   Primary Reason for Visit: Interventional Pain Management Treatment. CC: Knee Pain (right > left)  Procedure:          Anesthesia, Analgesia, Anxiolysis:  Type: Genicular Nerves Block (Superior-lateral, Superior-medial, and Inferior-medial Genicular Nerves)          CPT: 64450      Primary Purpose: Diagnostic Region: Lateral, Anterior, and Medial aspects of the knee joint, above and below the knee joint proper. Level: Superior and inferior to the knee joint. Target Area: For Genicular Nerve block(s), the targets are: the superior-lateral genicular nerve, located in the lateral distal portion of the femoral shaft as it curves to form the lateral epicondyle, in the region of the distal femoral metaphysis; the superior-medial genicular nerve, located in the medial distal portion of the femoral shaft as it curves to form the medial epicondyle; and the inferior-medial genicular nerve, located in the medial, proximal portion of the tibial shaft, as it curves to form the medial epicondyle, in the region of the proximal tibial metaphysis. Approach: Anterior, percutaneous, ipsilateral approach. Laterality: Bilateral  Type: Moderate (Conscious) Sedation combined with Local Anesthesia Indication(s): Analgesia and Anxiety Route: Intravenous (IV) IV Access: Secured Sedation: Meaningful verbal contact was maintained at all times during the procedure  Local Anesthetic: Lidocaine 1-2%  Position: Modified Fowler's position with pillows under the targeted knee(s).   Indications: 1. Bilateral primary osteoarthritis of knee (L>>R)   2.  Chronic pain of both knees (L>R, medial aspect)    Pain Score: Pre-procedure: 5 /10 Post-procedure: 0-No pain/10  Pre-op Assessment:  Victoria Patterson is a 57 y.o. (year old), female patient, seen today for interventional treatment. She  has a past surgical history that includes Breast cyst aspiration (Left, 1992). Victoria Patterson has a current medication list which includes the following prescription(s): acetaminophen, bisoprolol-hydrochlorothiazide, ca carbonate-mag hydroxide, diclofenac, duloxetine, ezetimibe, glimepiride, lamotrigine, levothyroxine, lisdexamfetamine, and metformin, and the following Facility-Administered Medications: fentanyl. Her primarily concern today is the Knee Pain (right > left)  Initial Vital Signs:  Pulse/HCG Rate: 72  Temp: 98.7 F (37.1 C) Resp: 18 BP: 131/83 SpO2: 99 %  BMI: Estimated body mass index is 38.62 kg/m as calculated from the following:   Height as of this encounter: 5\' 4"  (1.626 m).   Weight as of this encounter: 225 lb (102.1 kg).  Risk Assessment: Allergies: Reviewed. She is allergic to penicillins and stadol [butorphanol].  Allergy Precautions: None required Coagulopathies: Reviewed. None identified.  Blood-thinner therapy: None at this time Active Infection(s): Reviewed. None identified. Victoria Patterson is afebrile  Site Confirmation: Victoria Patterson was asked to confirm the procedure and laterality before marking the site Procedure checklist: Completed Consent: Before the procedure and under the influence of no sedative(s), amnesic(s), or anxiolytics, the patient was informed of the treatment options, risks and possible complications. To fulfill our ethical and legal obligations, as recommended by the American Medical Association's Code of Ethics, I have informed the patient of my clinical impression; the nature and purpose of the treatment or procedure; the risks, benefits, and possible complications of the intervention; the alternatives, including doing  nothing; the risk(s) and benefit(s) of the alternative treatment(s) or procedure(s);  and the risk(s) and benefit(s) of doing nothing. The patient was provided information about the general risks and possible complications associated with the procedure. These may include, but are not limited to: failure to achieve desired goals, infection, bleeding, organ or nerve damage, allergic reactions, paralysis, and death. In addition, the patient was informed of those risks and complications associated to the procedure, such as failure to decrease pain; infection; bleeding; organ or nerve damage with subsequent damage to sensory, motor, and/or autonomic systems, resulting in permanent pain, numbness, and/or weakness of one or several areas of the body; allergic reactions; (i.e.: anaphylactic reaction); and/or death. Furthermore, the patient was informed of those risks and complications associated with the medications. These include, but are not limited to: allergic reactions (i.e.: anaphylactic or anaphylactoid reaction(s)); adrenal axis suppression; blood sugar elevation that in diabetics may result in ketoacidosis or comma; water retention that in patients with history of congestive heart failure may result in shortness of breath, pulmonary edema, and decompensation with resultant heart failure; weight gain; swelling or edema; medication-induced neural toxicity; particulate matter embolism and blood vessel occlusion with resultant organ, and/or nervous system infarction; and/or aseptic necrosis of one or more joints. Finally, the patient was informed that Medicine is not an exact science; therefore, there is also the possibility of unforeseen or unpredictable risks and/or possible complications that may result in a catastrophic outcome. The patient indicated having understood very clearly. We have given the patient no guarantees and we have made no promises. Enough time was given to the patient to ask questions, all of  which were answered to the patient's satisfaction. Victoria Patterson has indicated that she wanted to continue with the procedure. Attestation: I, the ordering provider, attest that I have discussed with the patient the benefits, risks, side-effects, alternatives, likelihood of achieving goals, and potential problems during recovery for the procedure that I have provided informed consent. Date  Time: 01/09/2018  9:17 AM  Pre-Procedure Preparation:  Monitoring: As per clinic protocol. Respiration, ETCO2, SpO2, BP, heart rate and rhythm monitor placed and checked for adequate function Safety Precautions: Patient was assessed for positional comfort and pressure points before starting the procedure. Time-out: I initiated and conducted the "Time-out" before starting the procedure, as per protocol. The patient was asked to participate by confirming the accuracy of the "Time Out" information. Verification of the correct person, site, and procedure were performed and confirmed by me, the nursing staff, and the patient. "Time-out" conducted as per Joint Commission's Universal Protocol (UP.01.01.01). Time: 1028  Description of Procedure:          Area Prepped: Entire knee area, from mid-thigh to mid-shin, lateral, anterior, and medial aspects. Prepping solution: ChloraPrep (2% chlorhexidine gluconate and 70% isopropyl alcohol) Safety Precautions: Aspiration looking for blood return was conducted prior to all injections. At no point did we inject any substances, as a needle was being advanced. No attempts were made at seeking any paresthesias. Safe injection practices and needle disposal techniques used. Medications properly checked for expiration dates. SDV (single dose vial) medications used. Description of the Procedure: Protocol guidelines were followed. The patient was placed in position over the procedure table. The target area was identified and the area prepped in the usual manner. Skin & deeper tissues  infiltrated with local anesthetic. Appropriate amount of time allowed to pass for local anesthetics to take effect. The procedure needles were then advanced to the target area. Proper needle placement secured. Negative aspiration confirmed. Solution injected in intermittent fashion, asking for  systemic symptoms every 0.5cc of injectate. The needles were then removed and the area cleansed, making sure to leave some of the prepping solution back to take advantage of its long term bactericidal properties.  Vitals:   01/09/18 1045 01/09/18 1055 01/09/18 1105 01/09/18 1115  BP: 140/74 (!) 145/76 137/85 (!) 144/82  Pulse: 64 60 62 63  Resp: 16 20 15 19   Temp:  98.2 F (36.8 C)  99 F (37.2 C)  TempSrc:  Tympanic  Tympanic  SpO2: 97% 96% 98% 98%  Weight:      Height:        Start Time: 1028 hrs. End Time: 1042 hrs. Materials:  Needle(s) Type: Spinal Needle Gauge: 22G Length: 3.5-in Medication(s): Please see orders for medications and dosing details. 5 cc solution of 4 cc of 0.2% ropivacaine, 1 cc of Decadron 10 mg/cc.  1.5 cc injected at each level above for left knee. 5 cc solution of 4 cc of 0.2% ropivacaine, 1 cc of Decadron 10 mg/cc.  1.5 cc injected at each level above for right knee. Total steroid dose equals 20 mg of Decadron Imaging Guidance (Non-Spinal):          Type of Imaging Technique: Fluoroscopy Guidance (Non-Spinal) Indication(s): Assistance in needle guidance and placement for procedures requiring needle placement in or near specific anatomical locations not easily accessible without such assistance. Exposure Time: Please see nurses notes. Contrast: Before injecting any contrast, we confirmed that the patient did not have an allergy to iodine, shellfish, or radiological contrast. Once satisfactory needle placement was completed at the desired level, radiological contrast was injected. Contrast injected under live fluoroscopy. No contrast complications. See chart for type and  volume of contrast used. Fluoroscopic Guidance: I was personally present during the use of fluoroscopy. "Tunnel Vision Technique" used to obtain the best possible view of the target area. Parallax error corrected before commencing the procedure. "Direction-depth-direction" technique used to introduce the needle under continuous pulsed fluoroscopy. Once target was reached, antero-posterior, oblique, and lateral fluoroscopic projection used confirm needle placement in all planes. Images permanently stored in EMR. Interpretation: I personally interpreted the imaging intraoperatively. Adequate needle placement confirmed in multiple planes. Appropriate spread of contrast into desired area was observed. No evidence of afferent or efferent intravascular uptake. Permanent images saved into the patient's record.  Antibiotic Prophylaxis:   Anti-infectives (From admission, onward)   None     Indication(s): None identified  Post-operative Assessment:  Post-procedure Vital Signs:  Pulse/HCG Rate: 63  Temp: 99 F (37.2 C) Resp: 19 BP: (!) 144/82 SpO2: 98 %  EBL: None  Complications: No immediate post-treatment complications observed by team, or reported by patient.  Note: The patient tolerated the entire procedure well. A repeat set of vitals were taken after the procedure and the patient was kept under observation following institutional policy, for this type of procedure. Post-procedural neurological assessment was performed, showing return to baseline, prior to discharge. The patient was provided with post-procedure discharge instructions, including a section on how to identify potential problems. Should any problems arise concerning this procedure, the patient was given instructions to immediately contact us, at any time, without hesitation. In any case, we plan to contact the patient by telephone for a follow-up status report regarding this interventional procedure.  Comments:  No additional  relevant information.  Plan of Care   Imaging Orders     DG C-Arm 1-60 Min-No Report Procedure Orders    No procedure(s) ordered today    Medications ordered  for procedure: Meds ordered this encounter  Medications  . lactated ringers infusion 1,000 mL  . fentaNYL (SUBLIMAZE) injection 25-100 mcg    Make sure Narcan is available in the pyxis when using this medication. In the event of respiratory depression (RR< 8/min): Titrate NARCAN (naloxone) in increments of 0.1 to 0.2 mg IV at 2-3 minute intervals, until desired degree of reversal.  . ropivacaine (PF) 2 mg/mL (0.2%) (NAROPIN) injection 10 mL  . lidocaine (XYLOCAINE) 2 % (with pres) injection 400 mg  . dexamethasone (DECADRON) injection 10 mg  . dexamethasone (DECADRON) injection 10 mg   Medications administered: We administered lactated ringers, fentaNYL, ropivacaine (PF) 2 mg/mL (0.2%), lidocaine, dexamethasone, and dexamethasone.  See the medical record for exact dosing, route, and time of administration.  Disposition: Discharge home  Discharge Date & Time: 01/09/2018; 1119 hrs.   Physician-requested Follow-up: Return in about 5 weeks (around 02/13/2018) for Post Procedure Evaluation.  Future Appointments  Date Time Provider Department Center  01/14/2018 10:00 AM [facility] None  01/15/2018  1:30 PM [facility]  02/12/2018  9:45 AM Edward Jolly, MD [facility] None   Primary Care Physician: Marguarite Arbour, MD Location: [facility] Note by: Edward Jolly, MD Date: 01/09/2018; Time: 12:42 PM  Disclaimer:  Medicine is not an exact science. The only guarantee in medicine is that nothing is guaranteed. It is important to note that the decision to proceed with this intervention was based on the information collected from the patient. The Data and conclusions were drawn from the patient's questionnaire, the interview, and the physical examination. Because the  information was provided in large part by the patient, it cannot be guaranteed that it has not been purposely or unconsciously manipulated. Every effort has been made to obtain as much relevant data as possible for this evaluation. It is important to note that the conclusions that lead to this procedure are derived in large part from the available data. Always take into account that the treatment will also be dependent on availability of resources and existing treatment guidelines, considered by other Pain Management Practitioners as being common knowledge and practice, at the time of the intervention. For Medico-Legal purposes, it is also important to point out that variation in procedural techniques and pharmacological choices are the acceptable norm. The indications, contraindications, technique, and results of the above procedure should only be interpreted and judged by a Board-Certified Interventional Pain Specialist with extensive familiarity and expertise in the same exact procedure and technique.

## 2018-01-09 NOTE — Patient Instructions (Signed)

## 2018-01-09 NOTE — Progress Notes (Signed)
Safety precautions to be maintained throughout the outpatient stay will include: orient to surroundings, keep bed in low position, maintain call bell within reach at all times, provide assistance with transfer out of bed and ambulation.  

## 2018-01-10 ENCOUNTER — Telehealth: Payer: Self-pay

## 2018-01-10 NOTE — H&P (Signed)
Victoria Patterson is a 57 y.o. female here Fractional d+c  + h/s.  Here for f/up for PMB and thickened endometrial stripe . She had an [facility] 05/2017 = negative and a SIS that failed to show any abnormality  Pt still has PMB  U/ s today : Uterus anteflexed  Endometrium=7.91m  No free fluid seen  Lt ovarian simple cyst with bright echogenic focus=0.93cm  Rt ovary appears wnl  Past Medical History:  has a past medical history of ADD (attention deficit disorder), Allergic rhinitis, Anemia, Asthma without status asthmaticus, unspecified, Depression, Diabetes mellitus type 2, uncomplicated (CMS-HCC), GERD (gastroesophageal reflux disease), Hemorrhage of rectum and anus, Hemorrhoids, History of chicken pox, Hyperlipidemia, Hypertension, Osteoarthrosis, unspecified whether generalized or localized, lower leg, Other and unspecified ovarian cyst, and Unspecified hypothyroidism.  Past Surgical History:  has a past surgical history that includes Dilation and curettage, diagnostic / therapeutic (1992 and 2005); Cholecystectomy; and Functional endoscopic sinus surgery. Family History: family history includes Allergies in her mother; Breast cancer in her paternal aunt; COPD in her father; Colon cancer in her mother; Diabetes type II in her mother; High blood pressure (Hypertension) in her father; Lung cancer in her father; Myocardial Infarction (Heart attack) in her father; Ovarian cancer in her mother. Social History:  reports that she has never smoked. She has never used smokeless tobacco. She reports that she does not drink alcohol or use drugs. OB/GYN History:          OB History    Gravida  3   Para  1   Term  1   Preterm      AB  2   Living  1     SAB      TAB      Ectopic      Molar      Multiple      Live Births  1          Allergies: is allergic to crestor [rosuvastatin]; penicillin v potassium; shellfish containing products; and stadol [butorphanol  tartrate]. Medications:  Current Outpatient Medications:  .  bisoprolol-hydrochlorothiazide (ZIAC) 5-6.25 mg tablet, TAKE 1 TABLET BY MOUTH EVERY DAY, Disp: 90 tablet, Rfl: 0 .  diclofenac (VOLTAREN) 1 % topical gel, Apply 2 g topically 4 (four) times daily as needed (knee pain), Disp: 100 g, Rfl: 1 .  DULoxetine (CYMBALTA) 60 MG DR capsule, , Disp: , Rfl:  .  ezetimibe (ZETIA) 10 mg tablet, Take 1 tablet (10 mg total) by mouth once daily, Disp: 30 tablet, Rfl: 11 .  glimepiride (AMARYL) 4 MG tablet, Take 1 tablet (4 mg total) by mouth daily with breakfast, Disp: 30 tablet, Rfl: 11 .  GLUCOSE MONITORING KIT monitoring kit, Use as directed, Disp: 100 each, Rfl: 11 .  levothyroxine (SYNTHROID, LEVOTHROID) 75 MCG tablet, Take 1 tablet (75 mcg total) by mouth once daily Take on an empty stomach with a glass of water at least 30-60 minutes before breakfast., Disp: 30 tablet, Rfl: 11 .  metFORMIN (GLUCOPHAGE) 500 MG tablet, Take 1 tablet (500 mg total) by mouth 2 (two) times daily with meals, Disp: 60 tablet, Rfl: 11  Review of Systems: General:                      No fatigue or weight loss Eyes:                           No vision changes  Ears:                            No hearing difficulty Respiratory:                No cough or shortness of breath Pulmonary:                  No asthma or shortness of breath Cardiovascular:           No chest pain, palpitations, dyspnea on exertion Gastrointestinal:          No abdominal bloating, chronic diarrhea, constipations, masses, pain or hematochezia Genitourinary:             No hematuria, dysuria, abnormal vaginal discharge, pelvic pain, Menometrorrhagia Lymphatic:                   No swollen lymph nodes Musculoskeletal:         No muscle weakness Neurologic:                  No extremity weakness, syncope, seizure disorder Psychiatric:                  No history of depression, delusions or suicidal/homicidal ideation    Exam:       Vitals:   01/02/18 1413  BP: (!) 144/93  Pulse: 65    Body mass index is 40.21 kg/m.  WDWN white/ black female in NAD   Lungs: CTA  CV : RRR without murmur   Breast: exam done in sitting and lying position : No dimpling or retraction, no dominant mass, no spontaneous discharge, no axillary adenopathy Neck:  no thyromegaly Abdomen: soft , no mass, normal active bowel sounds,  non-tender, no rebound tenderness Pelvic: tanner stage 5 ,  External genitalia: vulva /labia no lesions Urethra: no prolapse Vagina: normal physiologic d/c Cervix: no lesions, no cervical motion tenderness   Uterus: normal size shape and contour, non-tender Adnexa: no mass,  non-tender   Rectovaginal:   Impression:   The primary encounter diagnosis was PMB (postmenopausal bleeding). Diagnoses of Thickened endometrium and Needs flu shot were also pertinent to this visit.    Plan:  Recommend a Fx D+C and H/S in the OR   risks of the procedure discussed with her      Caroline Sauger, MD

## 2018-01-10 NOTE — Telephone Encounter (Signed)
Post procedure phone call.  Patient states she is doing well.  

## 2018-01-14 ENCOUNTER — Telehealth: Payer: Self-pay

## 2018-01-14 ENCOUNTER — Inpatient Hospital Stay: Admission: RE | Admit: 2018-01-14 | Payer: Self-pay | Source: Ambulatory Visit

## 2018-01-14 ENCOUNTER — Telehealth: Payer: Self-pay | Admitting: *Deleted

## 2018-01-14 NOTE — Telephone Encounter (Signed)
Spoke with patient.  She states she is having pain in her right knee.  Patient states that her pain went away the first day of procedure, but it came back the next day.  Patient states pain is the same pain as before the surgery.  Explained to patient that it can take 4-10 days for the steroid to start working.  Instructed patient to document everything on her pain diary.  Instructed patient to call us for any further questions or concerns.

## 2018-01-14 NOTE — Telephone Encounter (Signed)
Pt calling after having Genicular Nerve block, Pain worse than Wed, wanting to speak with nurse .

## 2018-01-14 NOTE — Telephone Encounter (Signed)
Will write a note stating recovery period after GNB may be 7-10 days.

## 2018-01-15 ENCOUNTER — Ambulatory Visit: Payer: Managed Care, Other (non HMO)

## 2018-01-29 ENCOUNTER — Encounter
Admission: RE | Admit: 2018-01-29 | Discharge: 2018-01-29 | Disposition: A | Discharge status: Home / Self Care | Payer: Managed Care, Other (non HMO) | Source: Ambulatory Visit | Attending: Obstetrics and Gynecology | Admitting: Obstetrics and Gynecology

## 2018-01-29 ENCOUNTER — Other Ambulatory Visit: Payer: Self-pay

## 2018-01-29 DIAGNOSIS — Z01812 Encounter for preprocedural laboratory examination: Secondary | ICD-10-CM | POA: Diagnosis not present

## 2018-01-29 HISTORY — DX: Unspecified osteoarthritis, unspecified site: M19.90

## 2018-01-29 HISTORY — DX: Fatty (change of) liver, not elsewhere classified: K76.0

## 2018-01-29 HISTORY — DX: Hypothyroidism, unspecified: E03.9

## 2018-01-29 HISTORY — DX: Unspecified asthma, uncomplicated: J45.909

## 2018-01-29 HISTORY — DX: Essential (primary) hypertension: I10

## 2018-01-29 LAB — CBC
HCT: 38.9 % (ref 36.0–46.0)
HEMOGLOBIN: 13.1 g/dL (ref 12.0–15.0)
MCH: 31.6 pg (ref 26.0–34.0)
MCHC: 33.7 g/dL (ref 30.0–36.0)
MCV: 94 fL (ref 80.0–100.0)
PLATELETS: 375 10*3/uL (ref 150–400)
RBC: 4.14 MIL/uL (ref 3.87–5.11)
RDW: 13.2 % (ref 11.5–15.5)
WBC: 12 10*3/uL — AB (ref 4.0–10.5)
nRBC: 0 % (ref 0.0–0.2)

## 2018-01-29 LAB — COMPREHENSIVE METABOLIC PANEL
ALT: 26 U/L (ref 0–44)
ANION GAP: 10 (ref 5–15)
AST: 19 U/L (ref 15–41)
Albumin: 4.2 g/dL (ref 3.5–5.0)
Alkaline Phosphatase: 63 U/L (ref 38–126)
BUN: 21 mg/dL — ABNORMAL HIGH (ref 6–20)
CALCIUM: 9.4 mg/dL (ref 8.9–10.3)
CHLORIDE: 98 mmol/L (ref 98–111)
CO2: 29 mmol/L (ref 22–32)
CREATININE: 0.81 mg/dL (ref 0.44–1.00)
GFR calc non Af Amer: >60 mL/min (ref >60)
Glucose, Bld: 159 mg/dL — ABNORMAL HIGH (ref 70–99)
Potassium: 4.5 mmol/L (ref 3.5–5.1)
SODIUM: 137 mmol/L (ref 135–145)
Total Bilirubin: 0.5 mg/dL (ref 0.3–1.2)
Total Protein: 7.1 g/dL (ref 6.5–8.1)

## 2018-01-29 LAB — DIFFERENTIAL
Abs Immature Granulocytes: 0.06 10*3/uL (ref 0.00–0.07)
BASOS ABS: 0.1 10*3/uL (ref 0.0–0.1)
BASOS PCT: 0 %
EOS ABS: 0.3 10*3/uL (ref 0.0–0.5)
EOS PCT: 3 %
Immature Granulocytes: 1 %
Lymphocytes Relative: 40 %
Lymphs Abs: 4.9 10*3/uL — ABNORMAL HIGH (ref 0.7–4.0)
MONO ABS: 0.6 10*3/uL (ref 0.1–1.0)
MONOS PCT: 5 %
NEUTROS ABS: 6.1 10*3/uL (ref 1.7–7.7)
NEUTROS PCT: 51 %

## 2018-01-29 LAB — TYPE AND SCREEN
ABO/RH(D): O POS
ANTIBODY SCREEN: NEGATIVE

## 2018-01-29 NOTE — H&P (Signed)
Ms.Morrisis a 57 y.o.femalehere Fractional d+c  + h/s.  Here for f/up for PMB and thickened endometrial stripe . She had an [facility] 05/2017 = negative and a SIS that failed to show any abnormality  Pt still has PMB  U/ s today :Uterus anteflexed  Endometrium=7.53m  No free fluid seen  Lt ovarian simple cyst with bright echogenic focus=0.93cm  Rt ovary appears wnl  Past Medical History:has a past medical history of ADD (attention deficit disorder), Allergic rhinitis, Anemia, Asthma without status asthmaticus, unspecified, Depression, Diabetes mellitus type 2, uncomplicated (CMS-HCC), GERD (gastroesophageal reflux disease), Hemorrhage of rectum and anus, Hemorrhoids, History of chicken pox, Hyperlipidemia, Hypertension, Osteoarthrosis, unspecified whether generalized or localized, lower leg, Other and unspecified ovarian cyst, and Unspecified hypothyroidism. Past Surgical History:has a past surgical history that includes Dilation and curettage, diagnostic / therapeutic (1992 and 2005); Cholecystectomy; and Functional endoscopic sinus surgery. Family History:family history includes Allergies in her mother; Breast cancer in her paternal aunt; COPD in her father; Colon cancer in her mother; Diabetes type II in her mother; High blood pressure (Hypertension) in her father; Lung cancer in her father; Myocardial Infarction (Heart attack) in her father; Ovarian cancer in her mother. Social History:reports that she has never smoked. She has never used smokeless tobacco. She reports that she does not drink alcohol or use drugs. OB/GYN History:         OB History   Gravida  3   Para  1   Term  1   Preterm     AB  2   Living  1     SAB     TAB     Ectopic     Molar     Multiple     Live Births  1         Allergies:is allergic to crestor [rosuvastatin]; penicillin v potassium; shellfish containing products; and stadol  [butorphanol tartrate]. Medications:  Current Outpatient Medications:  . bisoprolol-hydrochlorothiazide (ZIAC) 5-6.25 mg tablet, TAKE 1 TABLET BY MOUTH EVERY DAY, Disp: 90 tablet, Rfl: 0 . diclofenac (VOLTAREN) 1 % topical gel, Apply 2 g topically 4 (four) times daily as needed (knee pain), Disp: 100 g, Rfl: 1 . DULoxetine (CYMBALTA) 60 MG DR capsule, , Disp: , Rfl:  . ezetimibe (ZETIA) 10 mg tablet, Take 1 tablet (10 mg total) by mouth once daily, Disp: 30 tablet, Rfl: 11 . glimepiride (AMARYL) 4 MG tablet, Take 1 tablet (4 mg total) by mouth daily with breakfast, Disp: 30 tablet, Rfl: 11 . GLUCOSE MONITORING KIT monitoring kit, Use as directed, Disp: 100 each, Rfl: 11 . levothyroxine (SYNTHROID, LEVOTHROID) 75 MCG tablet, Take 1 tablet (75 mcg total) by mouth once daily Take on an empty stomach with a glass of water at least 30-60 minutes before breakfast., Disp: 30 tablet, Rfl: 11 . metFORMIN (GLUCOPHAGE) 500 MG tablet, Take 1 tablet (500 mg total) by mouth 2 (two) times daily with meals, Disp: 60 tablet, Rfl: 11  Review of Systems: General: No fatigue or weightloss Eyes:No vision changes Ears:No hearing difficulty Respiratory:No cough or shortness of breath Pulmonary: No asthma or shortness of breath Cardiovascular:No chest pain, palpitations, dyspnea on exertion Gastrointestinal:No abdominal bloating, chronic diarrhea, constipations, masses, pain or hematochezia Genitourinary:No hematuria, dysuria, abnormal vaginal discharge, pelvic pain, Menometrorrhagia Lymphatic:No swollen lymph nodes Musculoskeletal:No muscle weakness Neurologic:No extremity weakness, syncope, seizure disorder Psychiatric:No history of depression, delusions or suicidal/homicidal ideation   Exam:  Vitals:   01/23/18                                BP:       128/84   Pulse: 65    Body mass index is 40.21 kg/m.  WDWN white/ black female in NAD  Lungs: CTA  CV: RRR without murmur  Breast:exam done in sitting and lying position : No dimpling or retraction, no dominant mass, no spontaneous discharge, no axillary adenopathy Neck: no thyromegaly Abdomen: soft , no mass, normal active bowel sounds, non-tender, no rebound tenderness Pelvic: tanner stage 5 ,  External genitalia: vulva /labia no lesions Urethra: no prolapse Vagina: normal physiologic d/c Cervix: no lesions, no cervical motion tenderness  Uterus: normal size shape and contour, non-tender Adnexa:no mass, non-tender  Rectovaginal:   Impression:   The primary encounter diagnosis was PMB (postmenopausal bleeding). Diagnoses of Thickened endometrium and Needs flu shot were also pertinent to this visit.    Plan:  Recommend a Fx D+C and H/S in the OR  risks of the procedure discussed with her      Caroline Sauger, MD 01/29/18

## 2018-01-29 NOTE — Pre-Procedure Instructions (Signed)
Faxed notification of out of date H&P.  Need new H&P day of surgery.

## 2018-01-29 NOTE — Patient Instructions (Signed)
Your procedure is scheduled on: Mon 11/25 Report to Day Surgery. To find out your arrival time please call [phone] between 1PM - 3PM on Friday.11/22  Remember: Instructions that are not followed completely may result in serious medical risk,  up to and including death, or upon the discretion of your surgeon and anesthesiologist your  surgery may need to be rescheduled.     _X__ 1. Do not eat food after midnight the night before your procedure.                 No gum chewing or hard candies. You may drink clear liquids up to 2 hours                 before you are scheduled to arrive for your surgery- DO not drink clear                 liquids within 2 hours of the start of your surgery.                 Clear Liquids include:  water,  __X__2.  On the morning of surgery brush your teeth with toothpaste and water, you                may rinse your mouth with mouthwash if you wish.  Do not swallow any toothpaste of mouthwash.     ___ 3.  No Alcohol for 24 hours before or after surgery.   ___ 4.  Do Not Smoke or use e-cigarettes For 24 Hours Prior to Your Surgery.                 Do not use any chewable tobacco products for at least 6 hours prior to                 surgery.  ____  5.  Bring all medications with you on the day of surgery if instructed.   _x___  6.  Notify your doctor if there is any change in your medical condition      (cold, fever, infections).     Do not wear jewelry, make-up, hairpins, clips or nail polish. Do not wear lotions, powders, or perfumes. You may wear deodorant. Do not shave 48 hours prior to surgery. Men may shave face and neck. Do not bring valuables to the hospital.    [facility] is not responsible for any belongings or valuables.  Contacts, dentures or bridgework may not be worn into surgery. Leave your suitcase in the car. After surgery it may be brought to your room. For patients admitted to the hospital, discharge time  is determined by your treatment team.   Patients discharged the day of surgery will not be allowed to drive home.   Please read over the following fact sheets that you were given:    __x__ Take these medicines the morning of surgery with A SIP OF WATER:    1. acetaminophen (TYLENOL) 500 MG tablet if needed  2.   3.   4.  5.  6.  ____ Fleet Enema (as directed)   ____ Use CHG Soap as directed  ____ Use inhalers on the day of surgery  _x___ Stop metformin 2 days prior to surgery metFORMIN (GLUCOPHAGE) 500 MG tablet last dose on 11/22  ____ Take 1/2 of usual insulin dose the night before surgery. No insulin the morning          of surgery.   ____ Stop Coumadin/Plavix/aspirin  on   __x__ Stop Anti-inflammatories on diclofenac (VOLTAREN) 75 MG EC tablet today   ____ Stop supplements until after surgery.    ____ Bring C-Pap to the hospital.

## 2018-02-04 ENCOUNTER — Ambulatory Visit: Payer: Managed Care, Other (non HMO) | Admitting: Certified Registered Nurse Anesthetist

## 2018-02-04 ENCOUNTER — Other Ambulatory Visit: Payer: Self-pay

## 2018-02-04 ENCOUNTER — Ambulatory Visit
Admission: RE | Admit: 2018-02-04 | Discharge: 2018-02-04 | Disposition: A | Discharge status: Home / Self Care | Payer: Managed Care, Other (non HMO) | Source: Ambulatory Visit | Attending: Obstetrics and Gynecology | Admitting: Obstetrics and Gynecology

## 2018-02-04 ENCOUNTER — Encounter
Admission: RE | Disposition: A | Discharge status: Home / Self Care | Payer: Self-pay | Source: Ambulatory Visit | Attending: Obstetrics and Gynecology

## 2018-02-04 DIAGNOSIS — Z803 Family history of malignant neoplasm of breast: Secondary | ICD-10-CM | POA: Insufficient documentation

## 2018-02-04 DIAGNOSIS — I10 Essential (primary) hypertension: Secondary | ICD-10-CM | POA: Insufficient documentation

## 2018-02-04 DIAGNOSIS — Z79899 Other long term (current) drug therapy: Secondary | ICD-10-CM | POA: Diagnosis not present

## 2018-02-04 DIAGNOSIS — J309 Allergic rhinitis, unspecified: Secondary | ICD-10-CM | POA: Insufficient documentation

## 2018-02-04 DIAGNOSIS — R9389 Abnormal findings on diagnostic imaging of other specified body structures: Secondary | ICD-10-CM | POA: Diagnosis not present

## 2018-02-04 DIAGNOSIS — F988 Other specified behavioral and emotional disorders with onset usually occurring in childhood and adolescence: Secondary | ICD-10-CM | POA: Insufficient documentation

## 2018-02-04 DIAGNOSIS — E119 Type 2 diabetes mellitus without complications: Secondary | ICD-10-CM | POA: Insufficient documentation

## 2018-02-04 DIAGNOSIS — E785 Hyperlipidemia, unspecified: Secondary | ICD-10-CM | POA: Diagnosis not present

## 2018-02-04 DIAGNOSIS — Z8041 Family history of malignant neoplasm of ovary: Secondary | ICD-10-CM | POA: Insufficient documentation

## 2018-02-04 DIAGNOSIS — Z8249 Family history of ischemic heart disease and other diseases of the circulatory system: Secondary | ICD-10-CM | POA: Diagnosis not present

## 2018-02-04 DIAGNOSIS — Z800 Family history of malignant neoplasm of digestive organs: Secondary | ICD-10-CM | POA: Insufficient documentation

## 2018-02-04 DIAGNOSIS — Z91013 Allergy to seafood: Secondary | ICD-10-CM | POA: Insufficient documentation

## 2018-02-04 DIAGNOSIS — K219 Gastro-esophageal reflux disease without esophagitis: Secondary | ICD-10-CM | POA: Insufficient documentation

## 2018-02-04 DIAGNOSIS — Z833 Family history of diabetes mellitus: Secondary | ICD-10-CM | POA: Diagnosis not present

## 2018-02-04 DIAGNOSIS — F329 Major depressive disorder, single episode, unspecified: Secondary | ICD-10-CM | POA: Diagnosis not present

## 2018-02-04 DIAGNOSIS — N950 Postmenopausal bleeding: Secondary | ICD-10-CM | POA: Insufficient documentation

## 2018-02-04 DIAGNOSIS — D649 Anemia, unspecified: Secondary | ICD-10-CM | POA: Insufficient documentation

## 2018-02-04 DIAGNOSIS — Z888 Allergy status to other drugs, medicaments and biological substances status: Secondary | ICD-10-CM | POA: Insufficient documentation

## 2018-02-04 DIAGNOSIS — Z7984 Long term (current) use of oral hypoglycemic drugs: Secondary | ICD-10-CM | POA: Insufficient documentation

## 2018-02-04 HISTORY — PX: HYSTEROSCOPY W/D&C: SHX1775

## 2018-02-04 HISTORY — PX: HYSTEROSCOPY WITH D & C: SHX1775

## 2018-02-04 LAB — ABO/RH: ABO/RH(D): O POS

## 2018-02-04 LAB — GLUCOSE, CAPILLARY
GLUCOSE-CAPILLARY: 131 mg/dL — AB (ref 70–99)
GLUCOSE-CAPILLARY: 158 mg/dL — AB (ref 70–99)

## 2018-02-04 SURGERY — DILATATION AND CURETTAGE /HYSTEROSCOPY
Anesthesia: General

## 2018-02-04 MED ORDER — ONDANSETRON HCL 4 MG/2ML IJ SOLN
INTRAMUSCULAR | Status: DC | PRN
  Administered 2018-02-04: 4 mg via INTRAVENOUS

## 2018-02-04 MED ORDER — FENTANYL CITRATE (PF) 100 MCG/2ML IJ SOLN
INTRAMUSCULAR | Status: DC | PRN
  Administered 2018-02-04 (×2): 50 ug via INTRAVENOUS

## 2018-02-04 MED ORDER — EPHEDRINE SULFATE 50 MG/ML IJ SOLN
INTRAMUSCULAR | Status: AC
  Filled 2018-02-04: qty 1

## 2018-02-04 MED ORDER — DEXAMETHASONE SODIUM PHOSPHATE 10 MG/ML IJ SOLN
INTRAMUSCULAR | Status: DC | PRN
  Administered 2018-02-04: 4 mg via INTRAVENOUS

## 2018-02-04 MED ORDER — SUCCINYLCHOLINE CHLORIDE 20 MG/ML IJ SOLN
INTRAMUSCULAR | Status: AC
  Filled 2018-02-04: qty 1

## 2018-02-04 MED ORDER — PHENYLEPHRINE HCL 10 MG/ML IJ SOLN
INTRAMUSCULAR | Status: DC | PRN
  Administered 2018-02-04 (×2): 100 ug via INTRAVENOUS

## 2018-02-04 MED ORDER — PROPOFOL 10 MG/ML IV BOLUS
INTRAVENOUS | Status: AC
  Filled 2018-02-04: qty 40

## 2018-02-04 MED ORDER — GLYCOPYRROLATE 0.2 MG/ML IJ SOLN
INTRAMUSCULAR | Status: AC
  Filled 2018-02-04: qty 1

## 2018-02-04 MED ORDER — FAMOTIDINE 20 MG PO TABS
20.0000 mg | ORAL_TABLET | Freq: Once | ORAL | Status: AC
  Administered 2018-02-04: 20 mg via ORAL

## 2018-02-04 MED ORDER — LIDOCAINE HCL (PF) 2 % IJ SOLN
INTRAMUSCULAR | Status: AC
  Filled 2018-02-04: qty 10

## 2018-02-04 MED ORDER — SODIUM CHLORIDE 0.9 % IV SOLN
INTRAVENOUS | Status: DC
  Administered 2018-02-04: 06:00:00 via INTRAVENOUS

## 2018-02-04 MED ORDER — SUCCINYLCHOLINE CHLORIDE 20 MG/ML IJ SOLN
INTRAMUSCULAR | Status: DC | PRN
  Administered 2018-02-04: 100 mg via INTRAVENOUS

## 2018-02-04 MED ORDER — SILVER NITRATE-POT NITRATE 75-25 % EX MISC
CUTANEOUS | Status: DC | PRN
  Administered 2018-02-04: 2

## 2018-02-04 MED ORDER — FENTANYL CITRATE (PF) 100 MCG/2ML IJ SOLN: 25.0000 ug | INTRAMUSCULAR | Status: DC | PRN

## 2018-02-04 MED ORDER — PHENYLEPHRINE HCL 10 MG/ML IJ SOLN
INTRAMUSCULAR | Status: AC
  Filled 2018-02-04: qty 1

## 2018-02-04 MED ORDER — ONDANSETRON HCL 4 MG/2ML IJ SOLN
INTRAMUSCULAR | Status: AC
  Filled 2018-02-04: qty 2

## 2018-02-04 MED ORDER — PROPOFOL 10 MG/ML IV BOLUS
INTRAVENOUS | Status: DC | PRN
  Administered 2018-02-04: 200 mg via INTRAVENOUS
  Administered 2018-02-04: 80 mg via INTRAVENOUS

## 2018-02-04 MED ORDER — LIDOCAINE HCL (CARDIAC) PF 100 MG/5ML IV SOSY
PREFILLED_SYRINGE | INTRAVENOUS | Status: DC | PRN
  Administered 2018-02-04: 100 mg via INTRAVENOUS

## 2018-02-04 MED ORDER — CEFAZOLIN SODIUM-DEXTROSE 2-4 GM/100ML-% IV SOLN
2.0000 g | Freq: Once | INTRAVENOUS | Status: AC
  Administered 2018-02-04: 2 g via INTRAVENOUS

## 2018-02-04 MED ORDER — CEFAZOLIN SODIUM-DEXTROSE 2-4 GM/100ML-% IV SOLN
INTRAVENOUS | Status: AC
  Filled 2018-02-04: qty 100

## 2018-02-04 MED ORDER — MIDAZOLAM HCL 2 MG/2ML IJ SOLN
INTRAMUSCULAR | Status: DC | PRN
  Administered 2018-02-04: 2 mg via INTRAVENOUS

## 2018-02-04 MED ORDER — FAMOTIDINE 20 MG PO TABS
ORAL_TABLET | ORAL | Status: AC
  Administered 2018-02-04: 20 mg via ORAL
  Filled 2018-02-04: qty 1

## 2018-02-04 MED ORDER — MIDAZOLAM HCL 2 MG/2ML IJ SOLN
INTRAMUSCULAR | Status: AC
  Filled 2018-02-04: qty 2

## 2018-02-04 MED ORDER — FENTANYL CITRATE (PF) 100 MCG/2ML IJ SOLN
INTRAMUSCULAR | Status: AC
  Filled 2018-02-04: qty 2

## 2018-02-04 MED ORDER — GLYCOPYRROLATE 0.2 MG/ML IJ SOLN
INTRAMUSCULAR | Status: DC | PRN
  Administered 2018-02-04: 0.2 mg via INTRAVENOUS

## 2018-02-04 MED ORDER — EPHEDRINE SULFATE 50 MG/ML IJ SOLN
INTRAMUSCULAR | Status: DC | PRN
  Administered 2018-02-04 (×5): 10 mg via INTRAVENOUS

## 2018-02-04 MED ORDER — ROCURONIUM BROMIDE 100 MG/10ML IV SOLN
INTRAVENOUS | Status: DC | PRN
  Administered 2018-02-04: 5 mg via INTRAVENOUS

## 2018-02-04 MED ORDER — ONDANSETRON HCL 4 MG/2ML IJ SOLN: 4.0000 mg | Freq: Once | INTRAMUSCULAR | Status: DC | PRN

## 2018-02-04 SURGICAL SUPPLY — 16 items
CANISTER SUCT 3000ML PPV (MISCELLANEOUS) ×3 IMPLANT
CATH ROBINSON RED A/P 16FR (CATHETERS) ×3 IMPLANT
COVER WAND RF STERILE (DRAPES) ×3 IMPLANT
GLOVE BIO SURGEON STRL SZ8 (GLOVE) ×3 IMPLANT
GOWN STRL REUS W/ TWL LRG LVL3 (GOWN DISPOSABLE) ×1 IMPLANT
GOWN STRL REUS W/ TWL XL LVL3 (GOWN DISPOSABLE) ×1 IMPLANT
GOWN STRL REUS W/TWL LRG LVL3 (GOWN DISPOSABLE) ×2
GOWN STRL REUS W/TWL XL LVL3 (GOWN DISPOSABLE) ×2
IV LACTATED RINGERS 1000ML (IV SOLUTION) ×3 IMPLANT
KIT TURNOVER CYSTO (KITS) ×3 IMPLANT
PACK DNC HYST (MISCELLANEOUS) ×3 IMPLANT
PAD OB MATERNITY 4.3X12.25 (PERSONAL CARE ITEMS) ×3 IMPLANT
PAD PREP 24X41 OB/GYN DISP (PERSONAL CARE ITEMS) ×3 IMPLANT
TOWEL OR 17X26 4PK STRL BLUE (TOWEL DISPOSABLE) ×3 IMPLANT
TUBING CONNECTING 10 (TUBING) ×2 IMPLANT
TUBING CONNECTING 10' (TUBING) ×1

## 2018-02-04 NOTE — Discharge Instructions (Addendum)
Dilation and Curettage or Vacuum Curettage, Care After °These instructions give you information about caring for yourself after your procedure. Your doctor may also give you more specific instructions. Call your doctor if you have any problems or questions after your procedure. °Follow these instructions at home: °Activity °· Do not drive or use heavy machinery while taking prescription pain medicine. °· For 24 hours after your procedure, avoid driving. °· Take short walks often, followed by rest periods. Ask your doctor what activities are safe for you. After one or two days, you may be able to return to your normal activities. °· Do not lift anything that is heavier than 10 lb (4.5 kg) until your doctor approves. °· For at least 2 weeks, or as long as told by your doctor: °? Do not douche. °? Do not use tampons. °? Do not have sex. °General instructions °· Take over-the-counter and prescription medicines only as told by your doctor. This is very important if you take blood thinning medicine. °· Do not take baths, swim, or use a hot tub until your doctor approves. Take showers instead of baths. °· Wear compression stockings as told by your doctor. °· It is up to you to get the results of your procedure. Ask your doctor when your results will be ready. °· Keep all follow-up visits as told by your doctor. This is important. °Contact a doctor if: °· You have very bad cramps that get worse or do not get better with medicine. °· You have very bad pain in your belly (abdomen). °· You cannot drink fluids without throwing up (vomiting). °· You get pain in a different part of the area between your belly and thighs (pelvis). °· You have bad-smelling discharge from your vagina. °· You have a rash. °Get help right away if: °· You are bleeding a lot from your vagina. A lot of bleeding means soaking more than one sanitary pad in an hour, for 2 hours in a row. °· You have clumps of blood (blood clots) coming from your  vagina. °· You have a fever or chills. °· Your belly feels very tender or hard. °· You have chest pain. °· You have trouble breathing. °· You cough up blood. °· You feel dizzy. °· You feel light-headed. °· You pass out (faint). °· You have pain in your neck or shoulder area. °Summary °· Take short walks often, followed by rest periods. Ask your doctor what activities are safe for you. After one or two days, you may be able to return to your normal activities. °· Do not lift anything that is heavier than 10 lb (4.5 kg) until your doctor approves. °· Do not take baths, swim, or use a hot tub until your doctor approves. Take showers instead of baths. °· Contact your doctor if you have any symptoms of infection, like bad-smelling discharge from your vagina. °This information is not intended to replace advice given to you by your health care provider. Make sure you discuss any questions you have with your health care provider. °Document Released: 12/07/2007 Document Revised: 11/15/2015 Document Reviewed: 11/15/2015 °Elsevier Interactive Patient Education © 2017 [employer] °AMBULATORY SURGERY  °DISCHARGE INSTRUCTIONS ° ° °1) The drugs that you were given will stay in your system until tomorrow so for the next 24 hours you should not: ° °A) Drive an automobile °B) Make any legal decisions °C) Drink any alcoholic beverage ° ° °2) You may resume regular meals tomorrow.  Today it is better to start   with liquids and gradually work up to solid foods. ° °You may eat anything you prefer, but it is better to start with liquids, then soup and crackers, and gradually work up to solid foods. ° ° °3) Please notify your doctor immediately if you have any unusual bleeding, trouble breathing, redness and pain at the surgery site, drainage, fever, or pain not relieved by medication. ° ° ° °4) Additional Instructions: ° ° ° ° ° ° ° °Please contact your physician with any problems or Same Day Surgery at [phone], Monday through  Friday 6 am to 4 pm, or [place] [facility] [place] Main number at [phone]. °

## 2018-02-04 NOTE — Op Note (Signed)
NAME: [name], [name]:[id number] ACCOUNT [id number] DATE OF BIRTH:1960/06/24 FACILITY: [facility] LOCATION: [facility] PHYSICIAN:[name], MD  OPERATIVE REPORT  DATE OF PROCEDURE:  02/04/2018  PREOPERATIVE DIAGNOSES:  Postmenopausal bleeding.  POSTOPERATIVE DIAGNOSES:  Postmenopausal bleeding.  PROCEDURE: 1.  Fractional dilation and curettage. 2.  Hysteroscopy.  ANESTHESIA:  General endotracheal anesthesia.  SURGEON:  [name], MD  INDICATIONS:  A 57 year old female with postmenopausal bleeding noted to have a thickened endometrial stripe.  The patient had an endometrial biopsy March 2019 that was negative for hyperplasia or dysplasia or cancer.  The patient had a saline infusion  sonohysterography that failed to show any endometrial abnormality.  DESCRIPTION OF PROCEDURE:  After adequate general endotracheal anesthesia, the patient was placed in dorsal supine position with the legs in the candy cane stirrups.  The patient's lower abdomen, perineum and vagina were prepped and draped in normal  sterile fashion.  Timeout was performed.  The patient did receive 2 g IV Ancef prior to commencement of the case.  A weighted speculum was placed in the posterior vaginal vault, and the anterior cervix was grasped with a single-tooth tenaculum.  An  endocervical curettage was performed followed by uterine sounding to 8 cm.  The cervix was dilated to #17 Hanks dilator without difficulty.  The hysteroscope was advanced into the endometrial cavity with lactated Ringer's as distending medium.  The  endometrial cavity appeared atrophic without any defects, polyps or submucosal fibroids noted.  Pictures were taken.  Hysteroscope was removed, and a sharp curettage was performed with good uterine cry.  Scant tissue removed.  Good hemostasis was noted  after the tenaculum was removed.  Silver nitrate was applied to 1 tenaculum site to control oozing.  There were no  complications.  The patient's bladder was drained at the beginning of the case, yielding 25 mL clear urine.  INTRAOPERATIVE FLUIDS:  700 mL.  ESTIMATED BLOOD LOSS:  Minimal.  DISPOSITION:  The patient tolerated the procedure well and was taken to recovery room in good condition.  LN/NUANCE  D:02/04/2018 T:02/04/2018 JOB:[id number]

## 2018-02-04 NOTE — Anesthesia Postprocedure Evaluation (Signed)
Anesthesia Post Note  Patient: Victoria Patterson  Procedure(s) Performed: DILATATION AND CURETTAGE /HYSTEROSCOPY, fractional (N/A )  Patient location during evaluation: PACU Anesthesia Type: General Level of consciousness: awake and alert Pain management: pain level controlled Vital Signs Assessment: post-procedure vital signs reviewed and stable Respiratory status: spontaneous breathing and respiratory function stable Cardiovascular status: blood pressure returned to baseline and stable Anesthetic complications: no     Last Vitals:  Vitals:   02/04/18 0909 02/04/18 0912  BP:  123/69  Pulse: 81 84  Resp: 18 17  Temp:    SpO2: 100% 94%    Last Pain:  Vitals:   02/04/18 0909  TempSrc:   PainSc: 0-No pain                 KEPHART,WILLIAM K

## 2018-02-04 NOTE — Transfer of Care (Signed)
Immediate Anesthesia Transfer of Care Note  Patient: Victoria Patterson  Procedure(s) Performed: DILATATION AND CURETTAGE /HYSTEROSCOPY, fractional (N/A )  Patient Location: PACU  Anesthesia Type:General  Level of Consciousness: drowsy  Airway & Oxygen Therapy: Patient Spontanous Breathing and Patient connected to face mask oxygen  Post-op Assessment: Report given to RN and Post -op Vital signs reviewed and stable  Post vital signs: Reviewed and stable  Last Vitals:  Vitals Value Taken Time  BP 142/82 02/04/2018  8:56 AM  Temp    Pulse 80 02/04/2018  8:58 AM  Resp 15 02/04/2018  8:58 AM  SpO2 99 % 02/04/2018  8:58 AM  Vitals shown include unvalidated device data.  Last Pain:  Vitals:   02/04/18 0601  TempSrc: Temporal  PainSc: 3          Complications: No apparent anesthesia complications

## 2018-02-04 NOTE — OR Nursing (Signed)
Discharge instructions discussed with pt and husband. Both vooice understanding.

## 2018-02-04 NOTE — OR Nursing (Signed)
Dr. Feliberto GottronSchermerhorn notified that patient has an allergy to PCN resulting in hives. He desires to continue with the Ancef.

## 2018-02-04 NOTE — Anesthesia Procedure Notes (Signed)
Procedure Name: Intubation Performed by: Saquan Furtick, CRNA Pre-anesthesia Checklist: Patient identified, Patient being monitored, Timeout performed, Emergency Drugs available and Suction available Patient Re-evaluated:Patient Re-evaluated prior to induction Oxygen Delivery Method: Circle system utilized Preoxygenation: Pre-oxygenation with 100% oxygen Induction Type: IV induction Ventilation: Mask ventilation without difficulty Laryngoscope Size: Mac and 3 Grade View: Grade II Tube type: Oral Tube size: 7.0 mm Number of attempts: 1 Airway Equipment and Method: Stylet Placement Confirmation: ETT inserted through vocal cords under direct vision,  positive ETCO2 and breath sounds checked- equal and bilateral Secured at: 22 cm Tube secured with: Tape Dental Injury: Teeth and Oropharynx as per pre-operative assessment        

## 2018-02-04 NOTE — Anesthesia Post-op Follow-up Note (Signed)
Anesthesia QCDR form completed.        

## 2018-02-04 NOTE — Anesthesia Preprocedure Evaluation (Signed)
Anesthesia Evaluation  Patient identified by MRN, date of birth, ID band Patient awake    Reviewed: Allergy & Precautions, NPO status , Patient's Chart, lab work & pertinent test results  History of Anesthesia Complications Negative for: history of anesthetic complications  Airway Mallampati: III       Dental   Pulmonary asthma (no inhalers for more than a year) , neg sleep apnea, neg COPD,           Cardiovascular hypertension, Pt. on medications (-) Past MI and (-) CHF (-) dysrhythmias (-) Valvular Problems/Murmurs     Neuro/Psych neg Seizures Anxiety Depression    GI/Hepatic Neg liver ROS, GERD  ,  Endo/Other  diabetes, Type 2, Oral Hypoglycemic AgentsHypothyroidism   Renal/GU negative Renal ROS     Musculoskeletal   Abdominal   Peds  Hematology   Anesthesia Other Findings   Reproductive/Obstetrics                             Anesthesia Physical Anesthesia Plan  ASA: III  Anesthesia Plan: General   Post-op Pain Management:    Induction: Intravenous  PONV Risk Score and Plan: 3 and Dexamethasone, Ondansetron and Midazolam  Airway Management Planned: Oral ETT  Additional Equipment:   Intra-op Plan:   Post-operative Plan:   Informed Consent: I have reviewed the patients History and Physical, chart, labs and discussed the procedure including the risks, benefits and alternatives for the proposed anesthesia with the patient or authorized representative who has indicated his/her understanding and acceptance.     Plan Discussed with:   Anesthesia Plan Comments:         Anesthesia Quick Evaluation

## 2018-02-04 NOTE — Brief Op Note (Signed)
02/04/2018  8:43 AM  PATIENT:  Victoria Patterson  57 y.o. female  PRE-OPERATIVE DIAGNOSIS:  postmenopausal bleeding, thickened stripe  POST-OPERATIVE DIAGNOSIS:  postmenopausal bleeding, thickened stripe  PROCEDURE:  Procedure(s): DILATATION AND CURETTAGE /HYSTEROSCOPY, fractional (N/A) Fractional dilation and curettage  SURGEON:  Surgeon(s) and Role:    * Terry Abila, Ihor Austinhomas J, MD - Primary  PHYSICIAN ASSISTANT:scrub    ASSISTANTS: none   ANESTHESIA:   general  EBL:  10 mL   BLOOD ADMINISTERED:none  DRAINS: none   LOCAL MEDICATIONS USED:  NONE  SPECIMEN:  Source of Specimen:  ecc, and endometrial curettings   DISPOSITION OF SPECIMEN:  PATHOLOGY  COUNTS:  YES  TOURNIQUET:  * No tourniquets in log *  DICTATION: .Other Dictation: Dictation Number verbal  PLAN OF CARE: Discharge to home after PACU  PATIENT DISPOSITION:  PACU - hemodynamically stable.   Delay start of Pharmacological VTE agent (>24hrs) due to surgical blood loss or risk of bleeding: not applicable

## 2018-02-04 NOTE — Progress Notes (Signed)
Pt ready for Fx D+C  And H/S . All questions answered .  NPO . Proceed

## 2018-02-06 LAB — SURGICAL PATHOLOGY

## 2018-02-11 ENCOUNTER — Other Ambulatory Visit: Payer: Self-pay | Admitting: Internal Medicine

## 2018-02-11 DIAGNOSIS — R1011 Right upper quadrant pain: Secondary | ICD-10-CM

## 2018-02-12 ENCOUNTER — Ambulatory Visit
Payer: Managed Care, Other (non HMO) | Attending: Student in an Organized Health Care Education/Training Program | Admitting: Student in an Organized Health Care Education/Training Program

## 2018-02-12 ENCOUNTER — Other Ambulatory Visit: Payer: Self-pay

## 2018-02-12 ENCOUNTER — Encounter: Payer: Self-pay | Admitting: Student in an Organized Health Care Education/Training Program

## 2018-02-12 VITALS — BP 128/86 | HR 71 | Temp 98.8°F | Resp 18 | Ht 63.0 in | Wt 221.0 lb

## 2018-02-12 DIAGNOSIS — Z811 Family history of alcohol abuse and dependence: Secondary | ICD-10-CM | POA: Diagnosis not present

## 2018-02-12 DIAGNOSIS — Z880 Allergy status to penicillin: Secondary | ICD-10-CM | POA: Insufficient documentation

## 2018-02-12 DIAGNOSIS — E039 Hypothyroidism, unspecified: Secondary | ICD-10-CM | POA: Diagnosis not present

## 2018-02-12 DIAGNOSIS — E119 Type 2 diabetes mellitus without complications: Secondary | ICD-10-CM | POA: Insufficient documentation

## 2018-02-12 DIAGNOSIS — G8929 Other chronic pain: Secondary | ICD-10-CM

## 2018-02-12 DIAGNOSIS — F909 Attention-deficit hyperactivity disorder, unspecified type: Secondary | ICD-10-CM | POA: Diagnosis not present

## 2018-02-12 DIAGNOSIS — F419 Anxiety disorder, unspecified: Secondary | ICD-10-CM | POA: Diagnosis not present

## 2018-02-12 DIAGNOSIS — M25561 Pain in right knee: Secondary | ICD-10-CM

## 2018-02-12 DIAGNOSIS — Z7984 Long term (current) use of oral hypoglycemic drugs: Secondary | ICD-10-CM | POA: Diagnosis not present

## 2018-02-12 DIAGNOSIS — F329 Major depressive disorder, single episode, unspecified: Secondary | ICD-10-CM | POA: Diagnosis not present

## 2018-02-12 DIAGNOSIS — Z818 Family history of other mental and behavioral disorders: Secondary | ICD-10-CM | POA: Insufficient documentation

## 2018-02-12 DIAGNOSIS — M170 Bilateral primary osteoarthritis of knee: Secondary | ICD-10-CM | POA: Diagnosis not present

## 2018-02-12 DIAGNOSIS — Z79899 Other long term (current) drug therapy: Secondary | ICD-10-CM | POA: Diagnosis not present

## 2018-02-12 DIAGNOSIS — M25562 Pain in left knee: Secondary | ICD-10-CM

## 2018-02-12 DIAGNOSIS — G894 Chronic pain syndrome: Secondary | ICD-10-CM

## 2018-02-12 DIAGNOSIS — I10 Essential (primary) hypertension: Secondary | ICD-10-CM | POA: Insufficient documentation

## 2018-02-12 DIAGNOSIS — F429 Obsessive-compulsive disorder, unspecified: Secondary | ICD-10-CM | POA: Insufficient documentation

## 2018-02-12 DIAGNOSIS — Z885 Allergy status to narcotic agent status: Secondary | ICD-10-CM | POA: Diagnosis not present

## 2018-02-12 DIAGNOSIS — Z79890 Hormone replacement therapy (postmenopausal): Secondary | ICD-10-CM | POA: Insufficient documentation

## 2018-02-12 DIAGNOSIS — M79605 Pain in left leg: Secondary | ICD-10-CM | POA: Diagnosis present

## 2018-02-12 NOTE — Progress Notes (Signed)
Patient's Name: Victoria Patterson  MRN: [id number]  Referring Provider: Idelle Crouch, MD  DOB: 29-Jun-1960  PCP: Idelle Crouch, MD  DOS: 02/12/2018  Note by: Gillis Santa, MD  Service setting: Ambulatory outpatient  Specialty: Interventional Pain Management  Location: [facility] (AMB) Pain Management Facility    Patient type: Established   Primary Reason(s) for Visit: Encounter for post-procedure evaluation of chronic illness with mild to moderate exacerbation CC: Leg Pain (left)  HPI  Victoria Patterson is a 57 y.o. year old, female patient, who comes today for a post-procedure evaluation. She has Bilateral primary osteoarthritis of knee; Chronic pain of both knees; Chronic pain syndrome; Arthralgia of both knees; Acquired hypothyroidism; ADD (attention deficit disorder); Asthma without status asthmaticus; Depression; and Diabetes mellitus type 2, uncomplicated (HCC) on their problem list. Her primarily concern today is the Leg Pain (left)  Pain Assessment: Location: Left Leg Radiating: denies Onset: More than a month ago Duration: Chronic pain Quality: Sharp Severity: 1 /10 (subjective, self-reported pain score)  Note: Reported level is compatible with observation.                         When using our objective Pain Scale, levels between 6 and 10/10 are said to belong in an emergency room, as it progressively worsens from a 6/10, described as severely limiting, requiring emergency care not usually available at an outpatient pain management facility. At a 6/10 level, communication becomes difficult and requires great effort. Assistance to reach the emergency department may be required. Facial flushing and profuse sweating along with potentially dangerous increases in heart rate and blood pressure will be evident. Effect on ADL: Limits activities Timing: Intermittent Modifying factors: sitting is better, injection  BP: 128/86  HR: 71  Victoria Patterson comes in today for post-procedure  evaluation.  Further details on both, my assessment(s), as well as the proposed treatment plan, please see below.  Post-Procedure Assessment  01/09/2018 Procedure: Bilateral genicular nerve block Pre-procedure pain score:        /10 Post-procedure pain score: 0/10         Influential Factors: BMI: 39.15 kg/m Intra-procedural challenges: None observed.         Assessment challenges: None detected.              Reported side-effects: None.        Post-procedural adverse reactions or complications: None reported         Sedation: Please see nurses note. When no sedatives are used, the analgesic levels obtained are directly associated to the effectiveness of the local anesthetics. However, when sedation is provided, the level of analgesia obtained during the initial 1 hour following the intervention, is believed to be the result of a combination of factors. These factors may include, but are not limited to: 1. The effectiveness of the local anesthetics used. 2. The effects of the analgesic(s) and/or anxiolytic(s) used. 3. The degree of discomfort experienced by the patient at the time of the procedure. 4. The patients ability and reliability in recalling and recording the events. 5. The presence and influence of possible secondary gains and/or psychosocial factors. Reported result: Relief experienced during the 1st hour after the procedure: 100 % (Ultra-Short Term Relief)            Interpretative annotation: Clinically appropriate result. Analgesia during this period is likely to be Local Anesthetic and/or IV Sedative (Analgesic/Anxiolytic) related.          Effects  of local anesthetic: The analgesic effects attained during this period are directly associated to the localized infiltration of local anesthetics and therefore cary significant diagnostic value as to the etiological location, or anatomical origin, of the pain. Expected duration of relief is directly dependent on the pharmacodynamics  of the local anesthetic used. Long-acting (4-6 hours) anesthetics used.  Reported result: Relief during the next 4 to 6 hour after the procedure: 100 % (Short-Term Relief)            Interpretative annotation: Clinically appropriate result. Analgesia during this period is likely to be Local Anesthetic-related.          Long-term benefit: Defined as the period of time past the expected duration of local anesthetics (1 hour for short-acting and 4-6 hours for long-acting). With the possible exception of prolonged sympathetic blockade from the local anesthetics, benefits during this period are typically attributed to, or associated with, other factors such as analgesic sensory neuropraxia, antiinflammatory effects, or beneficial biochemical changes provided by agents other than the local anesthetics.  Reported result: Extended relief following procedure: 100 % (Long-Term Relief)            Interpretative annotation: Clinically possible results. Good relief. No permanent benefit expected. Inflammation plays a part in the etiology to the pain.          Current benefits: Defined as reported results that persistent at this point in time.   Analgesia: 75 %            Function: Somewhat improved ROM: Somewhat improved Interpretative annotation: Recurrence of symptoms. No permanent benefit expected. Effective diagnostic intervention.          Interpretation: Results would suggest a successful diagnostic intervention.                  Plan:  Proceed with Radiofrequency Ablation for the purpose of attaining long-term benefits.                Laboratory Chemistry  Inflammation Markers (CRP: Acute Phase) (ESR: Chronic Phase) No results found for: CRP, ESRSEDRATE, LATICACIDVEN                       Rheumatology Markers No results found for: RF, ANA, LABURIC, URICUR, LYMEIGGIGMAB, LYMEABIGMQN, HLAB27                      Renal Function Markers Lab Results  Component Value Date   BUN 21 (H) 01/29/2018    CREATININE 0.81 01/29/2018   GFRAA >60 01/29/2018   GFRNONAA >60 01/29/2018                             Hepatic Function Markers Lab Results  Component Value Date   AST 19 01/29/2018   ALT 26 01/29/2018   ALBUMIN 4.2 01/29/2018   ALKPHOS 63 01/29/2018                        Electrolytes Lab Results  Component Value Date   NA 137 01/29/2018   K 4.5 01/29/2018   CL 98 01/29/2018   CALCIUM 9.4 01/29/2018                        Neuropathy Markers No results found for: VITAMINB12, FOLATE, HGBA1C, HIV  CNS Tests No results found for: COLORCSF, APPEARCSF, RBCCOUNTCSF, WBCCSF, POLYSCSF, LYMPHSCSF, EOSCSF, PROTEINCSF, GLUCCSF, JCVIRUS, CSFOLI, IGGCSF                      Bone Pathology Markers No results found for: VD25OH, [id number], GY1856DJ4, HF0263ZC5, 25OHVITD1, 25OHVITD2, 25OHVITD3, TESTOFREE, TESTOSTERONE                       Coagulation Parameters Lab Results  Component Value Date   PLT 375 01/29/2018                        Cardiovascular Markers Lab Results  Component Value Date   HGB 13.1 01/29/2018   HCT 38.9 01/29/2018                         CA Markers No results found for: CEA, CA125, LABCA2                      Note: Lab results reviewed.  Recent Diagnostic Imaging Results  DG C-Arm 1-60 Min-No Report Fluoroscopy was utilized by the requesting physician.  No radiographic  interpretation.   Complexity Note: Imaging results reviewed. Results shared with Victoria Patterson, using [employer].                         Meds   Current Outpatient Medications:  .  bisoprolol-hydrochlorothiazide (ZIAC) 5-6.25 MG tablet, Take 1 tablet by mouth daily., Disp: , Rfl:  .  Ca Carbonate-Mag Hydroxide (ROLAIDS PO), Take 1 tablet by mouth daily as needed (acid reflux)., Disp: , Rfl:  .  diclofenac (VOLTAREN) 75 MG EC tablet, Take 1 tablet (75 mg total) by mouth 2 (two) times daily after a meal., Disp: 60 tablet, Rfl: 2 .  DULoxetine (CYMBALTA) 60 MG  capsule, Take 60 mg by mouth daily., Disp: , Rfl:  .  ezetimibe (ZETIA) 10 MG tablet, Take 10 mg by mouth daily., Disp: , Rfl:  .  glimepiride (AMARYL) 4 MG tablet, Take 4 mg by mouth daily with breakfast., Disp: , Rfl:  .  lamoTRIgine (LAMICTAL) 150 MG tablet, Take 150 mg by mouth daily., Disp: , Rfl:  .  levothyroxine (SYNTHROID, LEVOTHROID) 75 MCG tablet, Take 75 mcg by mouth daily before breakfast., Disp: , Rfl:  .  metFORMIN (GLUCOPHAGE) 500 MG tablet, Take 500 mg by mouth 2 (two) times daily with a meal., Disp: , Rfl:  .  valsartan (DIOVAN) 80 MG tablet, Take 80 mg by mouth daily., Disp: , Rfl:  .  acetaminophen (TYLENOL) 500 MG tablet, Take 500 mg by mouth 2 (two) times daily as needed for moderate pain., Disp: , Rfl:   ROS  Constitutional: Denies any fever or chills Gastrointestinal: No reported hemesis, hematochezia, vomiting, or acute GI distress Musculoskeletal: Denies any acute onset joint swelling, redness, loss of ROM, or weakness Neurological: No reported episodes of acute onset apraxia, aphasia, dysarthria, agnosia, amnesia, paralysis, loss of coordination, or loss of consciousness  Allergies  Victoria Patterson is allergic to penicillins and stadol [butorphanol].  [place]  Drug: Victoria Patterson  reports that she does not use drugs. Alcohol:  reports that she does not drink alcohol. Tobacco:  reports that she has never smoked. She has never used smokeless tobacco. Medical:  has a past medical history of ADHD (attention deficit hyperactivity disorder), Anxiety, Arthritis, Asthma, Depression, Diabetes mellitus,  type II ([place]), Fatty liver, Hypertension, Hypothyroidism, and Obsessive-compulsive disorder. Surgical: Victoria Patterson  has a past surgical history that includes Breast cyst aspiration (Left, 1992); Cholecystectomy; dilation and evacuation; and Hysteroscopy w/D&C (N/A, 02/04/2018). Family: family history includes Alcohol abuse in her father; Bipolar disorder in her mother; Breast cancer in  her cousin; Breast cancer (age of onset: 60) in her paternal aunt; Ovarian cancer (age of onset: 56) in her mother.  Constitutional Exam  General appearance: Well nourished, well developed, and well hydrated. In no apparent acute distress Vitals:   02/12/18 0949  BP: 128/86  Pulse: 71  Resp: 18  Temp: 98.8 F (37.1 C)  SpO2: 98%  Weight: 221 lb (100.2 kg)  Height: '5\' 3"'$  (1.6 m)   BMI Assessment: Estimated body mass index is 39.15 kg/m as calculated from the following:   Height as of this encounter: '5\' 3"'$  (1.6 m).   Weight as of this encounter: 221 lb (100.2 kg).  BMI interpretation table: BMI level Category Range association with higher incidence of chronic pain  <18 kg/m2 Underweight   18.5-24.9 kg/m2 Ideal body weight   25-29.9 kg/m2 Overweight Increased incidence by 20%  30-34.9 kg/m2 Obese (Class I) Increased incidence by 68%  35-39.9 kg/m2 Severe obesity (Class II) Increased incidence by 136%  >40 kg/m2 Extreme obesity (Class III) Increased incidence by 254%   Patient's current BMI Ideal Body weight  Body mass index is 39.15 kg/m. Ideal body weight: 52.4 kg (115 lb 8.3 oz) Adjusted ideal body weight: 71.5 kg (157 lb 11.4 oz)   BMI Readings from Last 4 Encounters:  02/12/18 39.15 kg/m  01/29/18 38.28 kg/m  01/09/18 38.62 kg/m  12/31/17 37.76 kg/m   Wt Readings from Last 4 Encounters:  02/12/18 221 lb (100.2 kg)  01/29/18 223 lb (101.2 kg)  01/09/18 225 lb (102.1 kg)  12/31/17 220 lb (99.8 kg)  Psych/Mental status: Alert, oriented x 3 (person, place, & time)       Eyes: PERLA Respiratory: No evidence of acute respiratory distress  Cervical Spine Area Exam  Skin & Axial Inspection: No masses, redness, edema, swelling, or associated skin lesions Alignment: Symmetrical Functional ROM: Unrestricted ROM      Stability: No instability detected Muscle Tone/Strength: Functionally intact. No obvious neuro-muscular anomalies detected. Sensory (Neurological):  Unimpaired Palpation: No palpable anomalies              Upper Extremity (UE) Exam    Side: Right upper extremity  Side: Left upper extremity  Skin & Extremity Inspection: Skin color, temperature, and hair growth are WNL. No peripheral edema or cyanosis. No masses, redness, swelling, asymmetry, or associated skin lesions. No contractures.  Skin & Extremity Inspection: Skin color, temperature, and hair growth are WNL. No peripheral edema or cyanosis. No masses, redness, swelling, asymmetry, or associated skin lesions. No contractures.  Functional ROM: Unrestricted ROM          Functional ROM: Unrestricted ROM          Muscle Tone/Strength: Functionally intact. No obvious neuro-muscular anomalies detected.  Muscle Tone/Strength: Functionally intact. No obvious neuro-muscular anomalies detected.  Sensory (Neurological): Unimpaired          Sensory (Neurological): Unimpaired          Palpation: No palpable anomalies              Palpation: No palpable anomalies              Provocative Test(s):  Phalen's test: deferred Tinel's test: deferred  Apley's scratch test (touch opposite shoulder):  Action 1 (Across chest): deferred Action 2 (Overhead): deferred Action 3 (LB reach): deferred   Provocative Test(s):  Phalen's test: deferred Tinel's test: deferred Apley's scratch test (touch opposite shoulder):  Action 1 (Across chest): deferred Action 2 (Overhead): deferred Action 3 (LB reach): deferred    Thoracic Spine Area Exam  Skin & Axial Inspection: No masses, redness, or swelling Alignment: Symmetrical Functional ROM: Unrestricted ROM Stability: No instability detected Muscle Tone/Strength: Functionally intact. No obvious neuro-muscular anomalies detected. Sensory (Neurological): Unimpaired Muscle strength & Tone: No palpable anomalies  Lumbar Spine Area Exam  Skin & Axial Inspection: No masses, redness, or swelling Alignment: Symmetrical Functional ROM: Unrestricted ROM        Stability: No instability detected Muscle Tone/Strength: Functionally intact. No obvious neuro-muscular anomalies detected. Sensory (Neurological): Unimpaired Palpation: No palpable anomalies       Provocative Tests: Hyperextension/rotation test: deferred today       Lumbar quadrant test (Kemp's test): deferred today       Lateral bending test: deferred today       Patrick's Maneuver: deferred today                   FABER test: deferred today                   S-I anterior distraction/compression test: deferred today         S-I lateral compression test: deferred today         S-I Thigh-thrust test: deferred today         S-I Gaenslen's test: deferred today          Gait & Posture Assessment  Ambulation: Unassisted Gait: Relatively normal for age and body habitus Posture: WNL   Lower Extremity Exam    Side: Right lower extremity  Side: Left lower extremity  Stability: No instability observed          Stability: No instability observed          Skin & Extremity Inspection: Skin color, temperature, and hair growth are WNL. No peripheral edema or cyanosis. No masses, redness, swelling, asymmetry, or associated skin lesions. No contractures.  Skin & Extremity Inspection: Skin color, temperature, and hair growth are WNL. No peripheral edema or cyanosis. No masses, redness, swelling, asymmetry, or associated skin lesions. No contractures.  Functional ROM: Improved after treatment for knee joint          Functional ROM: Improved after treatment for knee joint          Muscle Tone/Strength: Functionally intact. No obvious neuro-muscular anomalies detected.  Muscle Tone/Strength: Functionally intact. No obvious neuro-muscular anomalies detected.  Sensory (Neurological): Arthropathic arthralgia        Sensory (Neurological): Arthropathic arthralgia        DTR: Patellar: deferred today Achilles: deferred today Plantar: deferred today  DTR: Patellar: deferred today Achilles: deferred  today Plantar: deferred today  Palpation: No palpable anomalies  Palpation: No palpable anomalies   Assessment  Primary Diagnosis & Pertinent Problem List: The primary encounter diagnosis was Bilateral primary osteoarthritis of knee (L>>R). Diagnoses of Chronic pain of both knees (L>R, medial aspect) and Chronic pain syndrome were also pertinent to this visit.  Status Diagnosis  Responding Responding Responding 1. Bilateral primary osteoarthritis of knee (L>>R)   2. Chronic pain of both knees (L>R, medial aspect)   3. Chronic pain syndrome      57 year old female with a history of bilateral knee  osteoarthritis who follows up status post bilateral genicular nerve block #2.  Aside from 3 to 4 days of postprocedural throbbing pain, patient states that she obtained significant, greater than 75% pain relief in regards to her knee pain after the previous block.  She states that her knee pain is returning and would like to proceed with radiofrequency ablation as we have discussed previously.  Risks and benefits of this procedure were discussed and patient would like to proceed.  We will start with the left knee first.  Plan: -Left knee genicular RFA followed by right.  Plan of Care  Lab-work, procedure(s), and/or referral(s): Orders Placed This Encounter  Procedures  . Radiofrequency,Genicular   Time Note: Greater than 50% of the 25 minute(s) of face-to-face time spent with Victoria Patterson, was spent in counseling/coordination of care regarding: Victoria Patterson primary cause of pain, the treatment plan, treatment alternatives, the risks and possible complications of proposed treatment, going over the informed consent, the results, interpretation and significance of  her recent diagnostic interventional treatment(s), realistic expectations and the goals of pain management (increased in functionality).   Provider-requested follow-up: Return for Procedure.  Future Appointments  Date Time Provider  Center  02/14/2018 10:15 AM [facility]    Primary Care Physician: Idelle Crouch, MD Location: [facility] Note by: Gillis Santa, M.D Date: 02/12/2018; Time: 10:49 AM  Patient Instructions  Ms Cederberg is undergoing knee procedures with me for her knee pain. If possible, please discuss work place modifications (I.e. Using rolling chair) to help limit the amount of time she is on her feet. If you need additional information, please don't hesitate to reach out.       ____________________________________________________________________________________________  Preparing for Procedure with Sedation  Instructions: . Oral Intake: Do not eat or drink anything for at least 8 hours prior to your procedure. . Transportation: Public transportation is not allowed. Bring an adult driver. The driver must be physically present in our waiting room before any procedure can be started. [place] Physical Assistance: Bring an adult physically capable of assisting you, in the event you need help. This adult should keep you company at home for at least 6 hours after the procedure. . Blood Pressure Medicine: Take your blood pressure medicine with a sip of water the morning of the procedure. . Blood thinners: Notify our staff if you are taking any blood thinners. Depending on which one you take, there will be specific instructions on how and when to stop it. . Diabetics on insulin: Notify the staff so that you can be scheduled 1st case in the morning. If your diabetes requires high dose insulin, take only  of your normal insulin dose the morning of the procedure and notify the staff that you have done so. . Preventing infections: Shower with an antibacterial soap the morning of your procedure. . Build-up your immune system: Take 1000 mg of Vitamin C with every meal (3 times a day) the day prior to your procedure. Marland Kitchen Antibiotics: Inform the staff if you have a  condition or reason that requires you to take antibiotics before dental procedures. . Pregnancy: If you are pregnant, call and cancel the procedure. . Sickness: If you have a cold, fever, or any active infections, call and cancel the procedure. . Arrival: You must be in the facility at least 30 minutes prior to your scheduled procedure. . Children: Do not bring children with you. . Dress appropriately: Bring dark clothing that you would not  mind if they get stained. . Valuables: Do not bring any jewelry or valuables.  Procedure appointments are reserved for interventional treatments only. Marland Kitchen No Prescription Refills. . No medication changes will be discussed during procedure appointments. . No disability issues will be discussed.  Reasons to call and reschedule or cancel your procedure: (Following these recommendations will minimize the risk of a serious complication.) . Surgeries: Avoid having procedures within 2 weeks of any surgery. (Avoid for 2 weeks before or after any surgery). . Flu Shots: Avoid having procedures within 2 weeks of a flu shots or . (Avoid for 2 weeks before or after immunizations). . Barium: Avoid having a procedure within 7-10 days after having had a radiological study involving the use of radiological contrast. (Myelograms, Barium swallow or enema study). . Heart attacks: Avoid any elective procedures or surgeries for the initial 6 months after a "Myocardial Infarction" (Heart Attack). . Blood thinners: It is imperative that you stop these medications before procedures. Let us know if you if you take any blood thinner.  . Infection: Avoid procedures during or within two weeks of an infection (including chest colds or gastrointestinal problems). Symptoms associated with infections include: Localized redness, fever, chills, night sweats or profuse sweating, burning sensation when voiding, cough, congestion, stuffiness, runny nose, sore throat, diarrhea, nausea, vomiting, cold  or Flu symptoms, recent or current infections. It is specially important if the infection is over the area that we intend to treat. Marland Kitchen Heart and lung problems: Symptoms that may suggest an active cardiopulmonary problem include: cough, chest pain, breathing difficulties or shortness of breath, dizziness, ankle swelling, uncontrolled high or unusually low blood pressure, and/or palpitations. If you are experiencing any of these symptoms, cancel your procedure and contact your primary care physician for an evaluation.  Remember:  Regular Business hours are:  Monday to Thursday 8:00 AM to 4:00 PM  Provider's Schedule: Milinda Pointer, MD:  Procedure days: Tuesday and Thursday 7:30 AM to 4:00 PM  Gillis Santa, MD:  Procedure days: Monday and Wednesday 7:30 AM to 4:00 PM ____________________________________________________________________________________________

## 2018-02-12 NOTE — Patient Instructions (Addendum)
Victoria Patterson is undergoing knee procedures with me for her knee pain. If possible, please discuss work place modifications (I.e. Using rolling chair) to help limit the amount of time she is on her feet. If you need additional information, please don't hesitate to reach out.       ____________________________________________________________________________________________  Preparing for Procedure with Sedation  Instructions: . Oral Intake: Do not eat or drink anything for at least 8 hours prior to your procedure. . Transportation: Public transportation is not allowed. Bring an adult driver. The driver must be physically present in our waiting room before any procedure can be started. Marland Kitchen. Physical Assistance: Bring an adult physically capable of assisting you, in the event you need help. This adult should keep you company at home for at least 6 hours after the procedure. . Blood Pressure Medicine: Take your blood pressure medicine with a sip of water the morning of the procedure. . Blood thinners: Notify our staff if you are taking any blood thinners. Depending on which one you take, there will be specific instructions on how and when to stop it. . Diabetics on insulin: Notify the staff so that you can be scheduled 1st case in the morning. If your diabetes requires high dose insulin, take only  of your normal insulin dose the morning of the procedure and notify the staff that you have done so. . Preventing infections: Shower with an antibacterial soap the morning of your procedure. . Build-up your immune system: Take 1000 mg of Vitamin C with every meal (3 times a day) the day prior to your procedure. Marland Kitchen. Antibiotics: Inform the staff if you have a condition or reason that requires you to take antibiotics before dental procedures. . Pregnancy: If you are pregnant, call and cancel the procedure. . Sickness: If you have a cold, fever, or any active infections, call and cancel the procedure. . Arrival:  You must be in the facility at least 30 minutes prior to your scheduled procedure. . Children: Do not bring children with you. . Dress appropriately: Bring dark clothing that you would not mind if they get stained. . Valuables: Do not bring any jewelry or valuables.  Procedure appointments are reserved for interventional treatments only. Marland Kitchen. No Prescription Refills. . No medication changes will be discussed during procedure appointments. . No disability issues will be discussed.  Reasons to call and reschedule or cancel your procedure: (Following these recommendations will minimize the risk of a serious complication.) . Surgeries: Avoid having procedures within 2 weeks of any surgery. (Avoid for 2 weeks before or after any surgery). . Flu Shots: Avoid having procedures within 2 weeks of a flu shots or . (Avoid for 2 weeks before or after immunizations). . Barium: Avoid having a procedure within 7-10 days after having had a radiological study involving the use of radiological contrast. (Myelograms, Barium swallow or enema study). . Heart attacks: Avoid any elective procedures or surgeries for the initial 6 months after a "Myocardial Infarction" (Heart Attack). . Blood thinners: It is imperative that you stop these medications before procedures. Let us know if you if you take any blood thinner.  . Infection: Avoid procedures during or within two weeks of an infection (including chest colds or gastrointestinal problems). Symptoms associated with infections include: Localized redness, fever, chills, night sweats or profuse sweating, burning sensation when voiding, cough, congestion, stuffiness, runny nose, sore throat, diarrhea, nausea, vomiting, cold or Flu symptoms, recent or current infections. It is specially important if the infection is  over the area that we intend to treat. Marland Kitchen Heart and lung problems: Symptoms that may suggest an active cardiopulmonary problem include: cough, chest pain, breathing  difficulties or shortness of breath, dizziness, ankle swelling, uncontrolled high or unusually low blood pressure, and/or palpitations. If you are experiencing any of these symptoms, cancel your procedure and contact your primary care physician for an evaluation.  Remember:  Regular Business hours are:  Monday to Thursday 8:00 AM to 4:00 PM  Provider's Schedule: Delano Metz, MD:  Procedure days: Tuesday and Thursday 7:30 AM to 4:00 PM  Edward Jolly, MD:  Procedure days: Monday and Wednesday 7:30 AM to 4:00 PM ____________________________________________________________________________________________

## 2018-02-12 NOTE — Progress Notes (Signed)
Safety precautions to be maintained throughout the outpatient stay will include: orient to surroundings, keep bed in low position, maintain call bell within reach at all times, provide assistance with transfer out of bed and ambulation.  

## 2018-02-14 ENCOUNTER — Other Ambulatory Visit: Payer: Self-pay | Admitting: Internal Medicine

## 2018-02-14 ENCOUNTER — Ambulatory Visit: Payer: Managed Care, Other (non HMO)

## 2018-02-14 DIAGNOSIS — R1011 Right upper quadrant pain: Secondary | ICD-10-CM

## 2018-02-18 ENCOUNTER — Ambulatory Visit
Admission: RE | Admit: 2018-02-18 | Discharge: 2018-02-18 | Disposition: A | Discharge status: Home / Self Care | Payer: Managed Care, Other (non HMO) | Source: Ambulatory Visit | Attending: Internal Medicine | Admitting: Internal Medicine

## 2018-02-18 DIAGNOSIS — I10 Essential (primary) hypertension: Secondary | ICD-10-CM | POA: Insufficient documentation

## 2018-02-18 DIAGNOSIS — R1011 Right upper quadrant pain: Secondary | ICD-10-CM | POA: Diagnosis not present

## 2018-02-18 DIAGNOSIS — Z9049 Acquired absence of other specified parts of digestive tract: Secondary | ICD-10-CM | POA: Insufficient documentation

## 2018-02-20 ENCOUNTER — Encounter: Payer: Self-pay | Admitting: Student in an Organized Health Care Education/Training Program

## 2018-02-20 ENCOUNTER — Ambulatory Visit
Admission: RE | Admit: 2018-02-20 | Discharge: 2018-02-20 | Disposition: A | Discharge status: Home / Self Care | Payer: Managed Care, Other (non HMO) | Source: Ambulatory Visit | Attending: Student in an Organized Health Care Education/Training Program | Admitting: Student in an Organized Health Care Education/Training Program

## 2018-02-20 ENCOUNTER — Ambulatory Visit (HOSPITAL_BASED_OUTPATIENT_CLINIC_OR_DEPARTMENT_OTHER): Payer: Managed Care, Other (non HMO) | Admitting: Student in an Organized Health Care Education/Training Program

## 2018-02-20 ENCOUNTER — Other Ambulatory Visit: Payer: Self-pay

## 2018-02-20 DIAGNOSIS — M170 Bilateral primary osteoarthritis of knee: Secondary | ICD-10-CM | POA: Insufficient documentation

## 2018-02-20 MED ORDER — LACTATED RINGERS IV SOLN
1000.0000 mL | Freq: Once | INTRAVENOUS | Status: AC
  Administered 2018-02-20: 1000 mL via INTRAVENOUS

## 2018-02-20 MED ORDER — FENTANYL CITRATE (PF) 100 MCG/2ML IJ SOLN
25.0000 ug | INTRAMUSCULAR | Status: DC | PRN
  Administered 2018-02-20: 100 ug via INTRAVENOUS
  Filled 2018-02-20: qty 2

## 2018-02-20 MED ORDER — LIDOCAINE HCL 2 % IJ SOLN
20.0000 mL | Freq: Once | INTRAMUSCULAR | Status: AC
  Administered 2018-02-20: 400 mg
  Filled 2018-02-20: qty 40

## 2018-02-20 MED ORDER — ROPIVACAINE HCL 2 MG/ML IJ SOLN
10.0000 mL | Freq: Once | INTRAMUSCULAR | Status: AC
  Administered 2018-02-20: 10 mL
  Filled 2018-02-20: qty 10

## 2018-02-20 MED ORDER — DEXAMETHASONE SODIUM PHOSPHATE 10 MG/ML IJ SOLN
10.0000 mg | Freq: Once | INTRAMUSCULAR | Status: AC
  Administered 2018-02-20: 10 mg
  Filled 2018-02-20: qty 1

## 2018-02-20 NOTE — Progress Notes (Signed)
Safety precautions to be maintained throughout the outpatient stay will include: orient to surroundings, keep bed in low position, maintain call bell within reach at all times, provide assistance with transfer out of bed and ambulation.  

## 2018-02-20 NOTE — Patient Instructions (Signed)
Preparing for Procedure with Sedation Instructions: . Oral Intake: Do not eat or drink anything for at least 8 hours prior to your procedure. . Transportation: Public transportation is not allowed. Bring an adult driver. The driver must be physically present in our waiting room before any procedure can be started. . Physical Assistance: Bring an adult capable of physically assisting you, in the event you need help. . Blood Pressure Medicine: Take your blood pressure medicine with a sip of water the morning of the procedure. . Insulin: Take only  of your normal insulin dose. . Preventing infections: Shower with an antibacterial soap the morning of your procedure. . Build-up your immune system: Take 1000 mg of Vitamin C with every meal (3 times a day) the day prior to your procedure. . Pregnancy: If you are pregnant, call and cancel the procedure. . Sickness: If you have a cold, fever, or any active infections, call and cancel the procedure. . Arrival: You must be in the facility at least 30 minutes prior to your scheduled procedure. . Children: Do not bring children with you. . Dress appropriately: Bring dark clothing that you would not mind if they get stained. . Valuables: Do not bring any jewelry or valuables. Procedure appointments are reserved for interventional treatments only. . No Prescription Refills. . No medication changes will be discussed during procedure appointments. No disability issues will be discussed.Post-procedure Information What to expect: Most procedures involve the use of a local anesthetic (numbing medicine), and a steroid (anti-inflammatory medicine).  The local anesthetics may cause temporary numbness and weakness of the legs or arms, depending on the location of the block. This numbness/weakness may last 4-6 hours, depending on the local anesthetic used. In rare instances, it can last up to 24 hours. While numb, you must be very careful not to injure the  extremity.  After any procedure, you could expect the pain to get better within 15-20 minutes. This relief is temporary and may last 4-6 hours. Once the local anesthetics wears off, you could experience discomfort, possibly more than usual, for up to 10 (ten) days. In the case of radiofrequencies, it may last up to 6 weeks. Surgeries may take up to 8 weeks for the healing process. The discomfort is due to the irritation caused by needles going through skin and muscle. To minimize the discomfort, we recommend using ice the first day, and heat from then on. The ice should be applied for 15 minutes on, and 15 minutes off. Keep repeating this cycle until bedtime. Avoid applying the ice directly to the skin, to prevent frostbite. Heat should be used daily, until the pain improves (4-10 days). Be careful not to burn yourself.  Occasionally you may experience muscle spasms or cramps. These occur as a consequence of the irritation caused by the needle sticks to the muscle and the blood that will inevitably be lost into the surrounding muscle tissue. Blood tends to be very irritating to tissues, which tend to react by going into spasm. These spasms may start the same day of your procedure, but they may also take days to develop. This late onset type of spasm or cramp is usually caused by electrolyte imbalances triggered by the steroids, at the level of the kidney. Cramps and spasms tend to respond well to muscle relaxants, multivitamins (some are triggered by the procedure, but may have their origins in vitamin deficiencies), and "Gatorade", or any sports drinks that can replenish any electrolyte imbalances. (If you are a diabetic, ask   your pharmacist to get you a sugar-free brand.) Warm showers or baths may also be helpful. Stretching exercises are highly recommended. General Instructions:  Be alert for signs of possible infection: redness, swelling, heat, red streaks, elevated temperature, and/or fever. These  typically appear 4 to 6 days after the procedure. Immediately notify your doctor if you experience unusual bleeding, difficulty breathing, or loss of bowel or bladder control. If you experience increased pain, do not increase your pain medicine intake, unless instructed by your pain physician. Post-Procedure Care:  Be careful in moving about. Muscle spasms in the area of the injection may occur. Applying ice or heat to the area is often helpful. The incidence of spinal headaches after epidural injections ranges between 1.4% and 6%. If you develop a headache that does not seem to respond to conservative therapy, please let your physician know. This can be treated with an epidural blood patch.   Post-procedure numbness or redness is to be expected, however it should average 4 to 6 hours. If numbness and weakness of your extremities begins to develop 4 to 6 hours after your procedure, and is felt to be progressing and worsening, immediately contact your physician.   Diet:  If you experience nausea, do not eat until this sensation goes away. If you had a "Stellate Ganglion Block" for upper extremity "Reflex Sympathetic Dystrophy", do not eat or drink until your hoarseness goes away. In any case, always start with liquids first and if you tolerate them well, then slowly progress to more solid foods. Activity:  For the first 4 to 6 hours after the procedure, use caution in moving about as you may experience numbness and/or weakness. Use caution in cooking, using household electrical appliances, and climbing steps. If you need to reach your Doctor call our office: [phone] Monday-Thursday 8:00 am - 4:00 PM    Fridays: Closed     In case of an emergency: In case of emergency, call 911 or go to the nearest emergency room and have the physician there call us.  Interpretation of Procedure Every nerve block has two components: a diagnostic component, and a treatment component. Unrealistic expectations are  the most common causes of "perceived failure".  In a perfect world, a single nerve block should be able to completely and permanently eliminate the pain. Sadly, the world is not perfect.  Most pain management nerve blocks are performed using local anesthetics and steroids. Steroids are responsible for any long-term benefit that you may experience. Their purpose is to decrease any chronic swelling that may exist in the area. Steroids begin to work immediately after being injected. However, most patients will not experience any benefits until 5 to 10 days after the injection, when the swelling has come down to the point where they can tell a difference. Steroids will only help if there is swelling to be treated. As such, they can assist with the diagnosis. If effective, they suggest an inflammatory component to the pain, and if ineffective, they rule out inflammation as the main cause or component of the problem. If the problem is one of mechanical compression, you will get no benefit from those steroids.   In the case of local anesthetics, they have a crucial role in the diagnosis of your condition. Most will begin to work within15 to 20 minutes after injection. The duration will depend on the type used (short- vs. Long-acting). It is of outmost importance that patients keep tract of their pain, after the procedure. To assist with   this matter, a "Post-procedure Pain Diary" is provided. Make sure to complete it and to bring it back to your follow-up appointment.  As long as the patient keeps accurate, detailed records of their symptoms after every procedure, and returns to have those interpreted, every procedure will provide us with invaluable information. Even a block that does not provide the patient with any relief, will always provide us with information about the mechanism and the origin of the pain. The only time a nerve block can be considered a waste of time is when patients do not keep track of the  results, or do not keep their post-procedure appointment.  Reporting the results back to your physician The Pain Score  Pain is a subjective complaint. It cannot be seen, touched, or measured. We depend entirely on the patient's report of the pain in order to assess your condition and treatment. To evaluate the pain, we use a pain scale, where "0" means "No Pain", and a "10" is "the worst possible pain that you can even imagine" (i.e. something like been eaten alive by a shark or being torn apart by a lion).  .  You will frequently be asked to rate your pain. Please be as accurate, remember that medical decisions will be based on your responses. Please do not rate your pain above a 10. Doing so is actually interpreted as "symptom magnification" (exaggeration), as well as lack of understanding with regards to the scale. To put this into perspective, when you tell us that your pain is at a 10 (ten), what you are saying is that there is nothing we can do to make this pain any worse. (Carefully think about that.) 

## 2018-02-20 NOTE — Progress Notes (Signed)
Patient's Name: Victoria Patterson  MRN: [id number]  Referring Provider: Edward Jolly, MD  DOB: 28-Aug-1960  PCP: Marguarite Arbour, MD  DOS: 02/20/2018  Note by: Edward Jolly, MD  Service setting: Ambulatory outpatient  Specialty: Interventional Pain Management  Patient type: Established  Location: [facility] (AMB) Pain Management Facility  Visit type: Interventional Procedure   Primary Reason for Visit: Interventional Pain Management Treatment. CC: Knee Pain (bilaterally)  Procedure:          Anesthesia, Analgesia, Anxiolysis:  Type: Therapeutic Superior-lateral, Superior-medial, and Inferior-medial, Genicular Nerve Radiofrequency Ablation.  #1  Region: Lateral, Anterior, and Medial aspects of the knee joint, above and below the knee joint proper. Level: Superior and inferior to the knee joint. Laterality: Left  Type: Moderate (Conscious) Sedation combined with Local Anesthesia Indication(s): Analgesia and Anxiety Route: Intravenous (IV) IV Access: Secured Sedation: Meaningful verbal contact was maintained at all times during the procedure  Local Anesthetic: Lidocaine 1-2%  Position: Supine   Indications: 1. Bilateral primary osteoarthritis of knee (L>>R)    Victoria Patterson has been dealing with the above chronic pain for longer than three months and has either failed to respond, was unable to tolerate, or simply did not get enough benefit from other more conservative therapies including, but not limited to: 1. Over-the-counter medications 2. Anti-inflammatory medications 3. Muscle relaxants 4. Membrane stabilizers 5. Opioids 6. Physical therapy and/or chiropractic manipulation 7. Modalities (Heat, ice, etc.) 8. Invasive techniques such as nerve blocks. Victoria Patterson has attained more than 50% relief of the pain from a series of diagnostic injections conducted in separate occasions.  Pain Score: Pre-procedure: 1 /10 Post-procedure: 0-No pain/10  Pre-op Assessment:  Victoria Patterson is a 57 y.o.  (year old), female patient, seen today for interventional treatment. She  has a past surgical history that includes Breast cyst aspiration (Left, 1992); Cholecystectomy; dilation and evacuation; and Hysteroscopy w/D&C (N/A, 02/04/2018). Victoria Patterson has a current medication list which includes the following prescription(s): bisoprolol-hydrochlorothiazide, ca carbonate-mag hydroxide, diclofenac, duloxetine, ezetimibe, glimepiride, lamotrigine, levothyroxine, metformin, valsartan, and acetaminophen, and the following Facility-Administered Medications: fentanyl. Her primarily concern today is the Knee Pain (bilaterally)  Initial Vital Signs:  Pulse/HCG Rate: 65ECG Heart Rate: 66 Temp: 98.2 F (36.8 C) Resp: 18 BP: 126/80 SpO2: 100 %  BMI: Estimated body mass index is 39.06 kg/m as calculated from the following:   Height as of this encounter: 5' 3.5" (1.613 m).   Weight as of this encounter: 224 lb (101.6 kg).  Risk Assessment: Allergies: Reviewed. She is allergic to penicillins and stadol [butorphanol].  Allergy Precautions: None required Coagulopathies: Reviewed. None identified.  Blood-thinner therapy: None at this time Active Infection(s): Reviewed. None identified. Victoria Patterson is afebrile  Site Confirmation: Victoria Patterson was asked to confirm the procedure and laterality before marking the site Procedure checklist: Completed Consent: Before the procedure and under the influence of no sedative(s), amnesic(s), or anxiolytics, the patient was informed of the treatment options, risks and possible complications. To fulfill our ethical and legal obligations, as recommended by the American Medical Association's Code of Ethics, I have informed the patient of my clinical impression; the nature and purpose of the treatment or procedure; the risks, benefits, and possible complications of the intervention; the alternatives, including doing nothing; the risk(s) and benefit(s) of the alternative treatment(s) or  procedure(s); and the risk(s) and benefit(s) of doing nothing. The patient was provided information about the general risks and possible complications associated with the procedure. These may include, but are not  limited to: failure to achieve desired goals, infection, bleeding, organ or nerve damage, allergic reactions, paralysis, and death. In addition, the patient was informed of those risks and complications associated to the procedure, such as failure to decrease pain; infection; bleeding; organ or nerve damage with subsequent damage to sensory, motor, and/or autonomic systems, resulting in permanent pain, numbness, and/or weakness of one or several areas of the body; allergic reactions; (i.e.: anaphylactic reaction); and/or death. Furthermore, the patient was informed of those risks and complications associated with the medications. These include, but are not limited to: allergic reactions (i.e.: anaphylactic or anaphylactoid reaction(s)); adrenal axis suppression; blood sugar elevation that in diabetics may result in ketoacidosis or comma; water retention that in patients with history of congestive heart failure may result in shortness of breath, pulmonary edema, and decompensation with resultant heart failure; weight gain; swelling or edema; medication-induced neural toxicity; particulate matter embolism and blood vessel occlusion with resultant organ, and/or nervous system infarction; and/or aseptic necrosis of one or more joints. Finally, the patient was informed that Medicine is not an exact science; therefore, there is also the possibility of unforeseen or unpredictable risks and/or possible complications that may result in a catastrophic outcome. The patient indicated having understood very clearly. We have given the patient no guarantees and we have made no promises. Enough time was given to the patient to ask questions, all of which were answered to the patient's satisfaction. Victoria Patterson has  indicated that she wanted to continue with the procedure. Attestation: I, the ordering provider, attest that I have discussed with the patient the benefits, risks, side-effects, alternatives, likelihood of achieving goals, and potential problems during recovery for the procedure that I have provided informed consent. Date  Time: 02/20/2018  7:50 AM  Pre-Procedure Preparation:  Monitoring: As per clinic protocol. Respiration, ETCO2, SpO2, BP, heart rate and rhythm monitor placed and checked for adequate function Safety Precautions: Patient was assessed for positional comfort and pressure points before starting the procedure. Time-out: I initiated and conducted the "Time-out" before starting the procedure, as per protocol. The patient was asked to participate by confirming the accuracy of the "Time Out" information. Verification of the correct person, site, and procedure were performed and confirmed by me, the nursing staff, and the patient. "Time-out" conducted as per Joint Commission's Universal Protocol (UP.01.01.01). Time: 0829  Description of Procedure:          Target Area: For Genicular Nerve block(s), the targets are: the superior-lateral genicular nerve, located in the lateral distal portion of the femoral shaft as it curves to form the lateral epicondyle, in the region of the distal femoral metaphysis; the superior-medial genicular nerve, located in the medial distal portion of the femoral shaft as it curves to form the medial epicondyle; and the inferior-medial genicular nerve, located in the medial, proximal portion of the tibial shaft, as it curves to form the medial epicondyle, in the region of the proximal tibial metaphysis. Approach: Anterior, ipsilateral approach. Area Prepped: Entire knee area, from mid-thigh to mid-shin, lateral, anterior, and medial aspects. Prepping solution: Hibiclens (4.0% Chlorhexidine gluconate solution) Safety Precautions: Aspiration looking for blood return  was conducted prior to all injections. At no point did we inject any substances, as a needle was being advanced. No attempts were made at seeking any paresthesias. Safe injection practices and needle disposal techniques used. Medications properly checked for expiration dates. SDV (single dose vial) medications used. Description of the Procedure: Protocol guidelines were followed. The patient was placed in  position over the procedure table. The target area was identified and the area prepped in the usual manner. The skin and muscle were infiltrated with local anesthetic. Appropriate amount of time allowed to pass for local anesthetics to take effect. Radiofrequency needles were introduced to the target area using fluoroscopic guidance. Using the NeuroTherm NT1100 Radiofrequency Generator, sensory stimulation using 50 Hz was used to locate & identify the nerve, making sure that the needle was positioned such that there was no sensory stimulation below 0.3 V or above 0.7 V. Stimulation using 2 Hz was used to evaluate the motor component. Care was taken not to lesion any nerves that demonstrated motor stimulation of the lower extremities at an output of less than 2.5 times that of the sensory threshold, or a maximum of 2.0 V. Once satisfactory placement of the needles was achieved, the numbing solution was slowly injected after negative aspiration. After waiting for at least 2 minutes, the ablation was performed at 80 degrees C for 60 seconds, using regular Radiofrequency settings. Once the procedure was completed, the needles were then removed and the area cleansed, making sure to leave some of the prepping solution back to take advantage of its long term bactericidal properties. Intra-operative Compliance: Compliant Vitals:   02/20/18 0905 02/20/18 0914 02/20/18 0924 02/20/18 0934  BP: 131/84 130/70 117/68   Pulse: 66     Resp: 15 16 13 14   Temp:  98.3 F (36.8 C)    TempSrc:      SpO2: 97% 96% 98% 98%   Weight:      Height:        Start Time: 0829 hrs. End Time: 0903 hrs. Materials & Medications:  Needle(s) Type: Teflon-coated, curved tip, Radiofrequency needle(s) Gauge: 22G Length: 10cm Medication(s): Please see orders for medications and dosing details. 5 cc solution made of 4 cc of 0.2% prilocaine, 1 cc of Decadron 10 mg/cc.  1.5 cc injected at each level above prior to lesioning. Imaging Guidance (Non-Spinal):          Type of Imaging Technique: Fluoroscopy Guidance (Non-Spinal) Indication(s): Assistance in needle guidance and placement for procedures requiring needle placement in or near specific anatomical locations not easily accessible without such assistance. Exposure Time: Please see nurses notes. Contrast: None used. Fluoroscopic Guidance: I was personally present during the use of fluoroscopy. "Tunnel Vision Technique" used to obtain the best possible view of the target area. Parallax error corrected before commencing the procedure. "Direction-depth-direction" technique used to introduce the needle under continuous pulsed fluoroscopy. Once target was reached, antero-posterior, oblique, and lateral fluoroscopic projection used confirm needle placement in all planes. Images permanently stored in EMR. Interpretation: No contrast injected.  Antibiotic Prophylaxis:   Anti-infectives (From admission, onward)   None     Indication(s): None identified  Post-operative Assessment:  Post-procedure Vital Signs:  Pulse/HCG Rate: 66(!) 57 Temp: 98.3 F (36.8 C) Resp: 14 BP: 117/68 SpO2: 98 %  EBL: None  Complications: No immediate post-treatment complications observed by team, or reported by patient.  Note: The patient tolerated the entire procedure well. A repeat set of vitals were taken after the procedure and the patient was kept under observation following institutional policy, for this type of procedure. Post-procedural neurological assessment was performed, showing  return to baseline, prior to discharge. The patient was provided with post-procedure discharge instructions, including a section on how to identify potential problems. Should any problems arise concerning this procedure, the patient was given instructions to immediately contact us, at any time,  without hesitation. In any case, we plan to contact the patient by telephone for a follow-up status report regarding this interventional procedure.  Comments:  No additional relevant information.  Plan of Care  Follow-up for contralateral, right genicular nerve RFA.  Imaging Orders     DG C-Arm 1-60 Min-No Report  Procedure Orders     Radiofrequency,Genicular  Medications ordered for procedure: Meds ordered this encounter  Medications  . lactated ringers infusion 1,000 mL  . fentaNYL (SUBLIMAZE) injection 25-100 mcg    Make sure Narcan is available in the pyxis when using this medication. In the event of respiratory depression (RR< 8/min): Titrate NARCAN (naloxone) in increments of 0.1 to 0.2 mg IV at 2-3 minute intervals, until desired degree of reversal.  . lidocaine (XYLOCAINE) 2 % (with pres) injection 400 mg  . ropivacaine (PF) 2 mg/mL (0.2%) (NAROPIN) injection 10 mL  . dexamethasone (DECADRON) injection 10 mg   Medications administered: We administered lactated ringers, fentaNYL, lidocaine, ropivacaine (PF) 2 mg/mL (0.2%), and dexamethasone.  See the medical record for exact dosing, route, and time of administration.  Disposition: Discharge home  Discharge Date & Time: 02/20/2018; 0940 hrs.   Physician-requested Follow-up: Return in about 4 weeks (around 03/20/2018) for Contra-lateral RFA.  No future appointments. Primary Care Physician: Marguarite Arbour, MD Location: [facility] Note by: Edward Jolly, MD Date: 02/20/2018; Time: 10:07 AM  Disclaimer:  Medicine is not an exact science. The only guarantee in medicine is that nothing is guaranteed. It is  important to note that the decision to proceed with this intervention was based on the information collected from the patient. The Data and conclusions were drawn from the patient's questionnaire, the interview, and the physical examination. Because the information was provided in large part by the patient, it cannot be guaranteed that it has not been purposely or unconsciously manipulated. Every effort has been made to obtain as much relevant data as possible for this evaluation. It is important to note that the conclusions that lead to this procedure are derived in large part from the available data. Always take into account that the treatment will also be dependent on availability of resources and existing treatment guidelines, considered by other Pain Management Practitioners as being common knowledge and practice, at the time of the intervention. For Medico-Legal purposes, it is also important to point out that variation in procedural techniques and pharmacological choices are the acceptable norm. The indications, contraindications, technique, and results of the above procedure should only be interpreted and judged by a Board-Certified Interventional Pain Specialist with extensive familiarity and expertise in the same exact procedure and technique.

## 2018-02-21 ENCOUNTER — Telehealth: Payer: Self-pay | Admitting: Student in an Organized Health Care Education/Training Program

## 2018-02-21 ENCOUNTER — Telehealth: Payer: Self-pay | Admitting: *Deleted

## 2018-02-21 NOTE — Telephone Encounter (Signed)
Patient advised ok to go back to work tomorrow.

## 2018-02-21 NOTE — Telephone Encounter (Signed)
Pt called and said she had RFA and wanted to know if she could go back to work tomorrow? She said she has to walk around a lot at work.

## 2018-02-21 NOTE — Telephone Encounter (Signed)
No problems post procedure. 

## 2018-02-22 ENCOUNTER — Telehealth: Payer: Self-pay | Admitting: Student in an Organized Health Care Education/Training Program

## 2018-02-22 NOTE — Telephone Encounter (Signed)
Having a lot of pain on left knee, please

## 2018-02-22 NOTE — Telephone Encounter (Signed)
Spoke with patient, states she had genicular RFA on Monday and the knee was doing fine but now she is having quite a bit of pain.  I told her that this was to be expected post RF and would probably improve over the next couple of weeks.  Patient asked if working and walking on it would do damage and I told her no, it may hurt more when up on it but that she would not do any damage to the knee. Patient verbalizes u/o.

## 2018-02-25 ENCOUNTER — Telehealth: Payer: Self-pay | Admitting: Student in an Organized Health Care Education/Training Program

## 2018-02-25 NOTE — Telephone Encounter (Signed)
Patient called this am stating she has had fever and swelling in leg since RF. It has gone down some but would like to speak with someone about this. Please call

## 2018-02-25 NOTE — Telephone Encounter (Signed)
Patient had some transient swelling and redness after genicular RF on 02-20-2018. States she had been up on her leg Friday for 6-8 hours at work and noticed it after. States it has resolved and is no longer having any symptoms. Denies fever or any other complications at this time. Instructed to call for any further concerns or return of swelling. Verbalized understanding of instructions.

## 2018-03-18 ENCOUNTER — Ambulatory Visit: Payer: Self-pay | Admitting: Student in an Organized Health Care Education/Training Program

## 2018-04-11 ENCOUNTER — Encounter: Payer: Self-pay | Admitting: Student in an Organized Health Care Education/Training Program

## 2018-04-11 ENCOUNTER — Other Ambulatory Visit: Payer: Self-pay

## 2018-04-11 ENCOUNTER — Ambulatory Visit
Payer: Managed Care, Other (non HMO) | Attending: Student in an Organized Health Care Education/Training Program | Admitting: Student in an Organized Health Care Education/Training Program

## 2018-04-11 VITALS — BP 118/89 | HR 67 | Temp 97.5°F | Resp 16 | Ht 63.0 in | Wt 224.0 lb

## 2018-04-11 DIAGNOSIS — M25562 Pain in left knee: Secondary | ICD-10-CM | POA: Diagnosis present

## 2018-04-11 DIAGNOSIS — M25561 Pain in right knee: Secondary | ICD-10-CM | POA: Insufficient documentation

## 2018-04-11 DIAGNOSIS — G894 Chronic pain syndrome: Secondary | ICD-10-CM | POA: Diagnosis not present

## 2018-04-11 DIAGNOSIS — M170 Bilateral primary osteoarthritis of knee: Secondary | ICD-10-CM | POA: Diagnosis present

## 2018-04-11 DIAGNOSIS — G8929 Other chronic pain: Secondary | ICD-10-CM | POA: Insufficient documentation

## 2018-04-11 MED ORDER — HYDROCODONE-ACETAMINOPHEN 5-325 MG PO TABS: 1.0000 | ORAL_TABLET | Freq: Every day | ORAL | 0 refills | Status: DC | PRN

## 2018-04-11 NOTE — Progress Notes (Signed)
Patient's Name: Victoria Patterson  MRN: [id number]  Referring Provider: Idelle Crouch, MD  DOB: 09/18/1960  PCP: Idelle Crouch, MD  DOS: 04/11/2018  Note by: Gillis Santa, MD  Service setting: Ambulatory outpatient  Specialty: Interventional Pain Management  Location: [facility] (AMB) Pain Management Facility    Patient type: Established   Primary Reason(s) for Visit: Encounter for post-procedure evaluation of chronic illness with mild to moderate exacerbation CC: Knee Pain (left)  HPI  Victoria Patterson is a 58 y.o. year old, female patient, who comes today for a post-procedure evaluation. She has Bilateral primary osteoarthritis of knee; Chronic pain of both knees; Chronic pain syndrome; Arthralgia of both knees; Acquired hypothyroidism; ADD (attention deficit disorder); Asthma without status asthmaticus; Depression; and Diabetes mellitus type 2, uncomplicated (HCC) on their problem list. Her primarily concern today is the Knee Pain (left)  Pain Assessment: Location: Left Knee Radiating: denies Onset: More than a month ago Duration: Chronic pain Quality:   Severity: 1 /10 (subjective, self-reported pain score)  Note: Reported level is compatible with observation.                         When using our objective Pain Scale, levels between 6 and 10/10 are said to belong in an emergency room, as it progressively worsens from a 6/10, described as severely limiting, requiring emergency care not usually available at an outpatient pain management facility. At a 6/10 level, communication becomes difficult and requires great effort. Assistance to reach the emergency department may be required. Facial flushing and profuse sweating along with potentially dangerous increases in heart rate and blood pressure will be evident. Effect on ADL:   Timing: Intermittent Modifying factors: not walking BP: 118/89  HR: 67  Victoria Patterson comes in today for post-procedure evaluation.  Further details on both, my  assessment(s), as well as the proposed treatment plan, please see below.  Post-Procedure Assessment  02/25/2018 Procedure: Left genicular nerve RFA Pre-procedure pain score:  1/10 Post-procedure pain score: 0/10         Influential Factors: BMI: 39.68 kg/m Intra-procedural challenges: None observed.         Assessment challenges: None detected.              Reported side-effects: None.        Post-procedural adverse reactions or complications: None reported         Sedation: Please see nurses note. When no sedatives are used, the analgesic levels obtained are directly associated to the effectiveness of the local anesthetics. However, when sedation is provided, the level of analgesia obtained during the initial 1 hour following the intervention, is believed to be the result of a combination of factors. These factors may include, but are not limited to: 1. The effectiveness of the local anesthetics used. 2. The effects of the analgesic(s) and/or anxiolytic(s) used. 3. The degree of discomfort experienced by the patient at the time of the procedure. 4. The patients ability and reliability in recalling and recording the events. 5. The presence and influence of possible secondary gains and/or psychosocial factors. Reported result: Relief experienced during the 1st hour after the procedure: 100 % (Ultra-Short Term Relief)            Interpretative annotation: Clinically appropriate result. Analgesia during this period is likely to be Local Anesthetic and/or IV Sedative (Analgesic/Anxiolytic) related.          Effects of local anesthetic: The analgesic effects attained during  this period are directly associated to the localized infiltration of local anesthetics and therefore cary significant diagnostic value as to the etiological location, or anatomical origin, of the pain. Expected duration of relief is directly dependent on the pharmacodynamics of the local anesthetic used. Long-acting (4-6 hours)  anesthetics used.  Reported result: Relief during the next 4 to 6 hour after the procedure: 100 % (Short-Term Relief)            Interpretative annotation: Clinically appropriate result. Analgesia during this period is likely to be Local Anesthetic-related.          Long-term benefit: Defined as the period of time past the expected duration of local anesthetics (1 hour for short-acting and 4-6 hours for long-acting). With the possible exception of prolonged sympathetic blockade from the local anesthetics, benefits during this period are typically attributed to, or associated with, other factors such as analgesic sensory neuropraxia, antiinflammatory effects, or beneficial biochemical changes provided by agents other than the local anesthetics.  Reported result: Extended relief following procedure: 100 % (Long-Term Relief)            Interpretative annotation: Clinically possible results. Good relief. No permanent benefit expected. Inflammation plays a part in the etiology to the pain.          Current benefits: Defined as reported results that persistent at this point in time.   Analgesia: 50-75 %            Function: Victoria Patterson reports improvement in function ROM: Victoria Patterson reports improvement in ROM Interpretative annotation: Ongoing benefit. Therapeutic success. Adequate RF ablation.          Interpretation: Results would suggest a successful therapeutic intervention.                  Plan:  Please see "Plan of Care" for details.                Laboratory Chemistry  Inflammation Markers (CRP: Acute Phase) (ESR: Chronic Phase) No results found for: CRP, ESRSEDRATE, LATICACIDVEN                       Rheumatology Markers No results found for: RF, ANA, LABURIC, URICUR, LYMEIGGIGMAB, LYMEABIGMQN, HLAB27                      Renal Function Markers Lab Results  Component Value Date   BUN 21 (H) 01/29/2018   CREATININE 0.81 01/29/2018   GFRAA >60 01/29/2018   GFRNONAA >60 01/29/2018                              Hepatic Function Markers Lab Results  Component Value Date   AST 19 01/29/2018   ALT 26 01/29/2018   ALBUMIN 4.2 01/29/2018   ALKPHOS 63 01/29/2018                        Electrolytes Lab Results  Component Value Date   NA 137 01/29/2018   K 4.5 01/29/2018   CL 98 01/29/2018   CALCIUM 9.4 01/29/2018                        Neuropathy Markers No results found for: VITAMINB12, FOLATE, HGBA1C, HIV                      CNS  Tests No results found for: COLORCSF, APPEARCSF, RBCCOUNTCSF, WBCCSF, POLYSCSF, LYMPHSCSF, EOSCSF, PROTEINCSF, GLUCCSF, JCVIRUS, CSFOLI, IGGCSF                      Bone Pathology Markers No results found for: VD25OH, [id number], [id number], [id number], 25OHVITD1, 25OHVITD2, 25OHVITD3, TESTOFREE, TESTOSTERONE                       Coagulation Parameters Lab Results  Component Value Date   PLT 375 01/29/2018                        Cardiovascular Markers Lab Results  Component Value Date   HGB 13.1 01/29/2018   HCT 38.9 01/29/2018                         CA Markers No results found for: CEA, CA125, LABCA2                      Note: Lab results reviewed.  Recent Diagnostic Imaging Results  DG C-Arm 1-60 Min-No Report Fluoroscopy was utilized by the requesting physician.  No radiographic  interpretation.   Complexity Note: Imaging results reviewed. Results shared with Ms. Lanphear, using [employer].                         Meds   Current Outpatient Medications:  .  acetaminophen (TYLENOL) 500 MG tablet, Take 500 mg by mouth 2 (two) times daily as needed for moderate pain., Disp: , Rfl:  .  bisoprolol-hydrochlorothiazide (ZIAC) 5-6.25 MG tablet, Take 1 tablet by mouth daily., Disp: , Rfl:  .  Ca Carbonate-Mag Hydroxide (ROLAIDS PO), Take 1 tablet by mouth daily as needed (acid reflux)., Disp: , Rfl:  .  diclofenac (VOLTAREN) 75 MG EC tablet, Take 1 tablet (75 mg total) by mouth 2 (two) times daily after a meal., Disp: 60  tablet, Rfl: 2 .  DULoxetine (CYMBALTA) 60 MG capsule, Take 60 mg by mouth daily., Disp: , Rfl:  .  ezetimibe (ZETIA) 10 MG tablet, Take 10 mg by mouth daily., Disp: , Rfl:  .  glimepiride (AMARYL) 4 MG tablet, Take 4 mg by mouth daily with breakfast., Disp: , Rfl:  .  lamoTRIgine (LAMICTAL) 150 MG tablet, Take 150 mg by mouth daily., Disp: , Rfl:  .  levothyroxine (SYNTHROID, LEVOTHROID) 75 MCG tablet, Take 75 mcg by mouth daily before breakfast., Disp: , Rfl:  .  metFORMIN (GLUCOPHAGE) 500 MG tablet, Take 500 mg by mouth 2 (two) times daily with a meal., Disp: , Rfl:  .  valsartan (DIOVAN) 80 MG tablet, Take 80 mg by mouth daily., Disp: , Rfl:  .  HYDROcodone-acetaminophen (NORCO/VICODIN) 5-325 MG tablet, Take 1 tablet by mouth daily as needed for moderate pain., Disp: 30 tablet, Rfl: 0  ROS  Constitutional: Denies any fever or chills Gastrointestinal: No reported hemesis, hematochezia, vomiting, or acute GI distress Musculoskeletal: Denies any acute onset joint swelling, redness, loss of ROM, or weakness Neurological: No reported episodes of acute onset apraxia, aphasia, dysarthria, agnosia, amnesia, paralysis, loss of coordination, or loss of consciousness  Allergies  Ms. Furches is allergic to penicillins and stadol [butorphanol].  [place]  Drug: Ms. Schmelzle  reports no history of drug use. Alcohol:  reports no history of alcohol use. Tobacco:  reports that she has never smoked. She has never used smokeless  tobacco. Medical:  has a past medical history of ADHD (attention deficit hyperactivity disorder), Anxiety, Arthritis, Asthma, Depression, Diabetes mellitus, type II ([place]), Fatty liver, Hypertension, Hypothyroidism, and Obsessive-compulsive disorder. Surgical: Ms. Farinas  has a past surgical history that includes Breast cyst aspiration (Left, 1992); Cholecystectomy; dilation and evacuation; and Hysteroscopy w/D&C (N/A, 02/04/2018). Family: family history includes Alcohol abuse in her  father; Bipolar disorder in her mother; Breast cancer in her cousin; Breast cancer (age of onset: 53) in her paternal aunt; Ovarian cancer (age of onset: 14) in her mother.  Constitutional Exam  General appearance: Well nourished, well developed, and well hydrated. In no apparent acute distress Vitals:   04/11/18 1138  BP: 118/89  Pulse: 67  Resp: 16  Temp: (!) 97.5 F (36.4 C)  TempSrc: Oral  SpO2: 100%  Weight: 224 lb (101.6 kg)  Height: _0  (1.6 m)   BMI Assessment: Estimated body mass index is 39.68 kg/m as calculated from the following:   Height as of this encounter: _1  (1.6 m).   Weight as of this encounter: 224 lb (101.6 kg).  BMI interpretation table: BMI level Category Range association with higher incidence of chronic pain  <18 kg/m2 Underweight   18.5-24.9 kg/m2 Ideal body weight   25-29.9 kg/m2 Overweight Increased incidence by 20%  30-34.9 kg/m2 Obese (Class I) Increased incidence by 68%  35-39.9 kg/m2 Severe obesity (Class II) Increased incidence by 136%  >40 kg/m2 Extreme obesity (Class III) Increased incidence by 254%   Patient's current BMI Ideal Body weight  Body mass index is 39.68 kg/m. Ideal body weight: 52.4 kg (115 lb 8.3 oz) Adjusted ideal body weight: 72.1 kg (158 lb 14.6 oz)   BMI Readings from Last 4 Encounters:  04/11/18 39.68 kg/m  02/20/18 39.06 kg/m  02/12/18 39.15 kg/m  01/29/18 38.28 kg/m   Wt Readings from Last 4 Encounters:  04/11/18 224 lb (101.6 kg)  02/20/18 224 lb (101.6 kg)  02/12/18 221 lb (100.2 kg)  01/29/18 223 lb (101.2 kg)  Psych/Mental status: Alert, oriented x 3 (person, place, & time)       Eyes: PERLA Respiratory: No evidence of acute respiratory distress  Cervical Spine Area Exam  Skin & Axial Inspection: No masses, redness, edema, swelling, or associated skin lesions Alignment: Symmetrical Functional ROM: Unrestricted ROM      Stability: No instability detected Muscle Tone/Strength: Functionally  intact. No obvious neuro-muscular anomalies detected. Sensory (Neurological): Unimpaired Palpation: No palpable anomalies              Upper Extremity (UE) Exam    Side: Right upper extremity  Side: Left upper extremity  Skin & Extremity Inspection: Skin color, temperature, and hair growth are WNL. No peripheral edema or cyanosis. No masses, redness, swelling, asymmetry, or associated skin lesions. No contractures.  Skin & Extremity Inspection: Skin color, temperature, and hair growth are WNL. No peripheral edema or cyanosis. No masses, redness, swelling, asymmetry, or associated skin lesions. No contractures.  Functional ROM: Unrestricted ROM          Functional ROM: Unrestricted ROM          Muscle Tone/Strength: Functionally intact. No obvious neuro-muscular anomalies detected.  Muscle Tone/Strength: Functionally intact. No obvious neuro-muscular anomalies detected.  Sensory (Neurological): Unimpaired          Sensory (Neurological): Unimpaired          Palpation: No palpable anomalies              Palpation: No  palpable anomalies              Provocative Test(s):  Phalen's test: deferred Tinel's test: deferred Apley's scratch test (touch opposite shoulder):  Action 1 (Across chest): deferred Action 2 (Overhead): deferred Action 3 (LB reach): deferred   Provocative Test(s):  Phalen's test: deferred Tinel's test: deferred Apley's scratch test (touch opposite shoulder):  Action 1 (Across chest): deferred Action 2 (Overhead): deferred Action 3 (LB reach): deferred    Thoracic Spine Area Exam  Skin & Axial Inspection: No masses, redness, or swelling Alignment: Symmetrical Functional ROM: Unrestricted ROM Stability: No instability detected Muscle Tone/Strength: Functionally intact. No obvious neuro-muscular anomalies detected. Sensory (Neurological): Unimpaired Muscle strength & Tone: No palpable anomalies  Lumbar Spine Area Exam  Skin & Axial Inspection: No masses, redness, or  swelling Alignment: Symmetrical Functional ROM: Unrestricted ROM       Stability: No instability detected Muscle Tone/Strength: Functionally intact. No obvious neuro-muscular anomalies detected. Sensory (Neurological): Unimpaired Palpation: No palpable anomalies       Provocative Tests: Hyperextension/rotation test: deferred today       Lumbar quadrant test (Kemp's test): deferred today       Lateral bending test: deferred today       Patrick's Maneuver: deferred today                   FABER* test: deferred today                   S-I anterior distraction/compression test: deferred today         S-I lateral compression test: deferred today         S-I Thigh-thrust test: deferred today         S-I Gaenslen's test: deferred today         *(Flexion, ABduction and External Rotation)  Gait & Posture Assessment  Ambulation: Unassisted Gait: Relatively normal for age and body habitus Posture: WNL   Lower Extremity Exam    Side: Right lower extremity  Side: Left lower extremity  Stability: No instability observed          Stability: No instability observed          Skin & Extremity Inspection: Skin color, temperature, and hair growth are WNL. No peripheral edema or cyanosis. No masses, redness, swelling, asymmetry, or associated skin lesions. No contractures.  Skin & Extremity Inspection: Skin color, temperature, and hair growth are WNL. No peripheral edema or cyanosis. No masses, redness, swelling, asymmetry, or associated skin lesions. No contractures.  Functional ROM: Unrestricted ROM                  Functional ROM: Improved after treatment for knee joint          Muscle Tone/Strength: Functionally intact. No obvious neuro-muscular anomalies detected.  Muscle Tone/Strength: Functionally intact. No obvious neuro-muscular anomalies detected.  Sensory (Neurological): Unimpaired        Sensory (Neurological): Improved        DTR: Patellar: deferred today Achilles: deferred today Plantar:  deferred today  DTR: Patellar: deferred today Achilles: deferred today Plantar: deferred today  Palpation: No palpable anomalies  Palpation: No palpable anomalies   Assessment  Primary Diagnosis & Pertinent Problem List: The primary encounter diagnosis was Bilateral primary osteoarthritis of knee (L>>R). Diagnoses of Chronic pain of both knees (L>R, medial aspect) and Chronic pain syndrome were also pertinent to this visit.  Status Diagnosis  Controlled Controlled Controlled 1. Bilateral primary osteoarthritis of knee (  L>>R)   2. Chronic pain of both knees (L>R, medial aspect)   3. Chronic pain syndrome      Patient follows up status post left knee genicular RFA.  Patient endorses benefit with this procedure.  She states that she is on her feet quite a bit at work and this has resulted in worsening pain in the evenings but prior to her increased job responsibility, her knee pain was doing well after the ablation.  She had 0 pain while stationary.  Her pain does increase with weightbearing the longer she is on on her feet during the workday.  -Prescription for hydrocodone as below for breakthrough pain (PMP checked and appropriate). -PRN left knee genicular RFA.  Plan of Care  Pharmacotherapy (Medications Ordered): Meds ordered this encounter  Medications  . HYDROcodone-acetaminophen (NORCO/VICODIN) 5-325 MG tablet    Sig: Take 1 tablet by mouth daily as needed for moderate pain.    Dispense:  30 tablet    Refill:  0   Lab-work, procedure(s), and/or referral(s): Orders Placed This Encounter  Procedures  . Radiofrequency,Genicular    Provider-requested follow-up: Return if symptoms worsen or fail to improve.  Time Note: Greater than 50% of the 25 minute(s) of face-to-face time spent with Ms. Buell, was spent in counseling/coordination of care regarding: Ms. Hughson primary cause of pain, the treatment plan, treatment alternatives, the risks and possible complications of  proposed treatment, going over the informed consent, the opioid analgesic risks and possible complications, the results, interpretation and significance of  her recent diagnostic interventional treatment(s), the appropriate use of her medications, realistic expectations and the goals of pain management (increased in functionality).  Primary Care Physician: Idelle Crouch, MD Location: [facility] Note by: Gillis Santa, M.D Date: 04/11/2018; Time: 8:12 AM  There are no Patient Instructions on file for this visit.

## 2018-04-11 NOTE — Progress Notes (Signed)
Safety precautions to be maintained throughout the outpatient stay will include: orient to surroundings, keep bed in low position, maintain call bell within reach at all times, provide assistance with transfer out of bed and ambulation.  

## 2018-04-12 ENCOUNTER — Telehealth: Payer: Self-pay

## 2018-04-12 NOTE — Telephone Encounter (Signed)
Left second message for patient to call us back so that we may get correct information from medication card to process her PA for Hydrocodone.

## 2018-04-16 ENCOUNTER — Telehealth: Payer: Self-pay | Admitting: Student in an Organized Health Care Education/Training Program

## 2018-04-16 NOTE — Telephone Encounter (Signed)
Pt returned Dena and Kori's phone call to give additional insurance information for PA. She stated to please call after 2 pm because she works 3rd shift.

## 2018-04-16 NOTE — Telephone Encounter (Signed)
Returned patient's call, information received.

## 2018-11-13 ENCOUNTER — Other Ambulatory Visit: Payer: Self-pay | Admitting: Internal Medicine

## 2018-11-13 DIAGNOSIS — R1032 Left lower quadrant pain: Secondary | ICD-10-CM

## 2018-12-17 ENCOUNTER — Ambulatory Visit: Payer: Managed Care, Other (non HMO)

## 2019-01-03 ENCOUNTER — Ambulatory Visit: Payer: Managed Care, Other (non HMO)

## 2019-01-20 ENCOUNTER — Telehealth: Payer: Self-pay | Admitting: *Deleted

## 2019-01-20 ENCOUNTER — Other Ambulatory Visit: Payer: Self-pay | Admitting: Internal Medicine

## 2019-01-20 ENCOUNTER — Other Ambulatory Visit: Payer: Self-pay | Admitting: Obstetrics and Gynecology

## 2019-01-20 DIAGNOSIS — Z1231 Encounter for screening mammogram for malignant neoplasm of breast: Secondary | ICD-10-CM

## 2019-01-20 DIAGNOSIS — M7989 Other specified soft tissue disorders: Secondary | ICD-10-CM

## 2019-01-20 DIAGNOSIS — M79605 Pain in left leg: Secondary | ICD-10-CM

## 2019-01-23 ENCOUNTER — Other Ambulatory Visit: Payer: Self-pay

## 2019-01-23 ENCOUNTER — Ambulatory Visit
Admission: RE | Admit: 2019-01-23 | Discharge: 2019-01-23 | Disposition: A | Discharge status: Home / Self Care | Payer: Managed Care, Other (non HMO) | Source: Ambulatory Visit | Attending: Internal Medicine | Admitting: Internal Medicine

## 2019-01-23 DIAGNOSIS — M7989 Other specified soft tissue disorders: Secondary | ICD-10-CM | POA: Diagnosis present

## 2019-01-23 DIAGNOSIS — M79605 Pain in left leg: Secondary | ICD-10-CM | POA: Diagnosis present

## 2019-02-12 ENCOUNTER — Ambulatory Visit
Admission: RE | Admit: 2019-02-12 | Discharge: 2019-02-12 | Disposition: A | Discharge status: Home / Self Care | Payer: Managed Care, Other (non HMO) | Source: Ambulatory Visit | Attending: Obstetrics and Gynecology | Admitting: Obstetrics and Gynecology

## 2019-02-12 DIAGNOSIS — Z1231 Encounter for screening mammogram for malignant neoplasm of breast: Secondary | ICD-10-CM | POA: Diagnosis present

## 2019-05-18 ENCOUNTER — Ambulatory Visit: Payer: Managed Care, Other (non HMO) | Attending: Internal Medicine

## 2019-05-18 DIAGNOSIS — Z23 Encounter for immunization: Secondary | ICD-10-CM | POA: Insufficient documentation

## 2019-05-18 NOTE — Progress Notes (Signed)
   Covid-19 Vaccination Clinic  Name:  Victoria Patterson    MRN: [id number] DOB: 1960-04-01  05/18/2019  [name] was observed post Covid-19 immunization for 15 minutes without incident. She was provided with Vaccine Information Sheet and instruction to access the V-Safe system.   [name] was instructed to call 911 with any severe reactions post vaccine: Marland Kitchen Difficulty breathing  . Swelling of face and throat  . A fast heartbeat  . A bad rash all over body  . Dizziness and weakness   Immunizations Administered    Name Date Dose VIS Date Route   Pfizer COVID-19 Vaccine 05/18/2019  9:05 AM 0.3 mL 02/21/2019 Intramuscular   Manufacturer: [employer], [employer]   Lot: [id number]   NDC: [address]-6

## 2019-06-11 ENCOUNTER — Other Ambulatory Visit: Payer: Self-pay

## 2019-06-11 ENCOUNTER — Ambulatory Visit: Payer: Managed Care, Other (non HMO) | Attending: Internal Medicine

## 2019-06-11 DIAGNOSIS — Z23 Encounter for immunization: Secondary | ICD-10-CM

## 2019-06-11 NOTE — Progress Notes (Signed)
   Covid-19 Vaccination Clinic  Name:  Victoria Patterson    MRN: [id number] DOB: 03/20/1960  06/11/2019  Victoria Patterson was observed post Covid-19 immunization for 15 minutes without incident. She was provided with Vaccine Information Sheet and instruction to access the V-Safe system.   Victoria Patterson was instructed to call 911 with any severe reactions post vaccine: Marland Kitchen Difficulty breathing  . Swelling of face and throat  . A fast heartbeat  . A bad rash all over body  . Dizziness and weakness   Immunizations Administered    Name Date Dose VIS Date Route   Pfizer COVID-19 Vaccine 06/11/2019  3:52 PM 0.3 mL 02/21/2019 Intramuscular   Manufacturer: [employer], [employer]   Lot: [id number]   NDC: [address]-4

## 2019-09-01 ENCOUNTER — Other Ambulatory Visit: Payer: Self-pay | Admitting: Podiatry

## 2019-09-01 DIAGNOSIS — M79671 Pain in right foot: Secondary | ICD-10-CM

## 2019-09-12 ENCOUNTER — Ambulatory Visit (HOSPITAL_COMMUNITY): Payer: 59

## 2019-09-13 ENCOUNTER — Ambulatory Visit (HOSPITAL_COMMUNITY): Payer: 59

## 2019-09-17 ENCOUNTER — Other Ambulatory Visit: Payer: Managed Care, Other (non HMO)

## 2019-09-18 ENCOUNTER — Ambulatory Visit (HOSPITAL_COMMUNITY): Payer: Managed Care, Other (non HMO)

## 2019-10-01 ENCOUNTER — Ambulatory Visit
Admission: RE | Admit: 2019-10-01 | Discharge: 2019-10-01 | Disposition: A | Discharge status: Home / Self Care | Payer: 59 | Source: Ambulatory Visit | Attending: Podiatry | Admitting: Podiatry

## 2019-10-01 ENCOUNTER — Other Ambulatory Visit: Payer: Self-pay

## 2019-10-01 DIAGNOSIS — M79671 Pain in right foot: Secondary | ICD-10-CM | POA: Insufficient documentation

## 2019-11-05 ENCOUNTER — Other Ambulatory Visit: Payer: Self-pay | Admitting: Internal Medicine

## 2019-11-05 DIAGNOSIS — Z1231 Encounter for screening mammogram for malignant neoplasm of breast: Secondary | ICD-10-CM

## 2019-11-19 ENCOUNTER — Other Ambulatory Visit: Payer: Self-pay | Admitting: Physical Medicine & Rehabilitation

## 2019-11-19 DIAGNOSIS — M5414 Radiculopathy, thoracic region: Secondary | ICD-10-CM

## 2019-12-06 ENCOUNTER — Other Ambulatory Visit: Payer: Self-pay

## 2019-12-06 ENCOUNTER — Ambulatory Visit
Admission: RE | Admit: 2019-12-06 | Discharge: 2019-12-06 | Disposition: A | Discharge status: Home / Self Care | Payer: 59 | Source: Ambulatory Visit | Attending: Physical Medicine & Rehabilitation | Admitting: Physical Medicine & Rehabilitation

## 2019-12-06 DIAGNOSIS — M5414 Radiculopathy, thoracic region: Secondary | ICD-10-CM

## 2020-03-03 ENCOUNTER — Other Ambulatory Visit: Payer: Self-pay

## 2020-03-03 ENCOUNTER — Ambulatory Visit
Admission: RE | Admit: 2020-03-03 | Discharge: 2020-03-03 | Disposition: A | Discharge status: Home / Self Care | Payer: BC Managed Care – PPO | Source: Ambulatory Visit | Attending: Internal Medicine | Admitting: Internal Medicine

## 2020-03-03 DIAGNOSIS — Z1231 Encounter for screening mammogram for malignant neoplasm of breast: Secondary | ICD-10-CM

## 2020-12-03 ENCOUNTER — Other Ambulatory Visit: Payer: Self-pay | Admitting: Internal Medicine

## 2020-12-03 DIAGNOSIS — Z1231 Encounter for screening mammogram for malignant neoplasm of breast: Secondary | ICD-10-CM

## 2021-01-25 ENCOUNTER — Other Ambulatory Visit: Payer: Self-pay

## 2021-01-25 ENCOUNTER — Ambulatory Visit
Admission: RE | Admit: 2021-01-25 | Discharge: 2021-01-25 | Disposition: A | Discharge status: Home / Self Care | Payer: 59 | Source: Ambulatory Visit | Attending: Internal Medicine | Admitting: Internal Medicine

## 2021-01-25 DIAGNOSIS — Z1231 Encounter for screening mammogram for malignant neoplasm of breast: Secondary | ICD-10-CM | POA: Diagnosis present

## 2021-04-04 DIAGNOSIS — Z79899 Other long term (current) drug therapy: Secondary | ICD-10-CM | POA: Diagnosis not present

## 2021-04-04 DIAGNOSIS — E118 Type 2 diabetes mellitus with unspecified complications: Secondary | ICD-10-CM | POA: Diagnosis not present

## 2021-04-04 DIAGNOSIS — E782 Mixed hyperlipidemia: Secondary | ICD-10-CM | POA: Diagnosis not present

## 2021-04-04 DIAGNOSIS — I10 Essential (primary) hypertension: Secondary | ICD-10-CM | POA: Diagnosis not present

## 2021-04-11 DIAGNOSIS — E039 Hypothyroidism, unspecified: Secondary | ICD-10-CM | POA: Diagnosis not present

## 2021-04-11 DIAGNOSIS — F429 Obsessive-compulsive disorder, unspecified: Secondary | ICD-10-CM | POA: Diagnosis not present

## 2021-04-11 DIAGNOSIS — R69 Illness, unspecified: Secondary | ICD-10-CM | POA: Diagnosis not present

## 2021-04-11 DIAGNOSIS — F411 Generalized anxiety disorder: Secondary | ICD-10-CM | POA: Diagnosis not present

## 2021-04-11 DIAGNOSIS — R0602 Shortness of breath: Secondary | ICD-10-CM | POA: Diagnosis not present

## 2021-04-11 DIAGNOSIS — E118 Type 2 diabetes mellitus with unspecified complications: Secondary | ICD-10-CM | POA: Diagnosis not present

## 2021-04-11 DIAGNOSIS — I10 Essential (primary) hypertension: Secondary | ICD-10-CM | POA: Diagnosis not present

## 2021-04-11 DIAGNOSIS — E782 Mixed hyperlipidemia: Secondary | ICD-10-CM | POA: Diagnosis not present

## 2021-04-15 DIAGNOSIS — G8929 Other chronic pain: Secondary | ICD-10-CM | POA: Diagnosis not present

## 2021-04-15 DIAGNOSIS — M1712 Unilateral primary osteoarthritis, left knee: Secondary | ICD-10-CM | POA: Diagnosis not present

## 2021-04-15 DIAGNOSIS — M25462 Effusion, left knee: Secondary | ICD-10-CM | POA: Diagnosis not present

## 2021-06-06 DIAGNOSIS — F429 Obsessive-compulsive disorder, unspecified: Secondary | ICD-10-CM | POA: Diagnosis not present

## 2021-06-06 DIAGNOSIS — R69 Illness, unspecified: Secondary | ICD-10-CM | POA: Diagnosis not present

## 2021-06-06 DIAGNOSIS — F411 Generalized anxiety disorder: Secondary | ICD-10-CM | POA: Diagnosis not present

## 2021-06-06 DIAGNOSIS — F5081 Binge eating disorder: Secondary | ICD-10-CM | POA: Diagnosis not present

## 2021-06-17 DIAGNOSIS — F429 Obsessive-compulsive disorder, unspecified: Secondary | ICD-10-CM | POA: Diagnosis not present

## 2021-06-17 DIAGNOSIS — R69 Illness, unspecified: Secondary | ICD-10-CM | POA: Diagnosis not present

## 2021-06-17 DIAGNOSIS — F411 Generalized anxiety disorder: Secondary | ICD-10-CM | POA: Diagnosis not present

## 2021-06-23 DIAGNOSIS — J302 Other seasonal allergic rhinitis: Secondary | ICD-10-CM | POA: Diagnosis not present

## 2021-06-23 DIAGNOSIS — H9203 Otalgia, bilateral: Secondary | ICD-10-CM | POA: Diagnosis not present

## 2021-07-01 DIAGNOSIS — R69 Illness, unspecified: Secondary | ICD-10-CM | POA: Diagnosis not present

## 2021-07-01 DIAGNOSIS — F429 Obsessive-compulsive disorder, unspecified: Secondary | ICD-10-CM | POA: Diagnosis not present

## 2021-07-01 DIAGNOSIS — F411 Generalized anxiety disorder: Secondary | ICD-10-CM | POA: Diagnosis not present

## 2021-07-01 DIAGNOSIS — F5081 Binge eating disorder: Secondary | ICD-10-CM | POA: Diagnosis not present

## 2021-07-01 DIAGNOSIS — R5383 Other fatigue: Secondary | ICD-10-CM | POA: Diagnosis not present

## 2021-07-05 DIAGNOSIS — E782 Mixed hyperlipidemia: Secondary | ICD-10-CM | POA: Diagnosis not present

## 2021-07-05 DIAGNOSIS — Z79899 Other long term (current) drug therapy: Secondary | ICD-10-CM | POA: Diagnosis not present

## 2021-07-05 DIAGNOSIS — E118 Type 2 diabetes mellitus with unspecified complications: Secondary | ICD-10-CM | POA: Diagnosis not present

## 2021-07-12 DIAGNOSIS — G720 Drug-induced myopathy: Secondary | ICD-10-CM | POA: Diagnosis not present

## 2021-07-12 DIAGNOSIS — T466X5A Adverse effect of antihyperlipidemic and antiarteriosclerotic drugs, initial encounter: Secondary | ICD-10-CM | POA: Diagnosis not present

## 2021-07-12 DIAGNOSIS — I10 Essential (primary) hypertension: Secondary | ICD-10-CM | POA: Diagnosis not present

## 2021-07-12 DIAGNOSIS — E039 Hypothyroidism, unspecified: Secondary | ICD-10-CM | POA: Diagnosis not present

## 2021-07-12 DIAGNOSIS — E118 Type 2 diabetes mellitus with unspecified complications: Secondary | ICD-10-CM | POA: Diagnosis not present

## 2021-07-12 DIAGNOSIS — E782 Mixed hyperlipidemia: Secondary | ICD-10-CM | POA: Diagnosis not present

## 2021-07-20 DIAGNOSIS — R5383 Other fatigue: Secondary | ICD-10-CM | POA: Diagnosis not present

## 2021-07-20 DIAGNOSIS — F5081 Binge eating disorder: Secondary | ICD-10-CM | POA: Diagnosis not present

## 2021-07-20 DIAGNOSIS — F411 Generalized anxiety disorder: Secondary | ICD-10-CM | POA: Diagnosis not present

## 2021-07-20 DIAGNOSIS — F429 Obsessive-compulsive disorder, unspecified: Secondary | ICD-10-CM | POA: Diagnosis not present

## 2021-07-20 DIAGNOSIS — R69 Illness, unspecified: Secondary | ICD-10-CM | POA: Diagnosis not present

## 2021-07-20 DIAGNOSIS — Z79899 Other long term (current) drug therapy: Secondary | ICD-10-CM | POA: Diagnosis not present

## 2021-08-10 DIAGNOSIS — R69 Illness, unspecified: Secondary | ICD-10-CM | POA: Diagnosis not present

## 2021-08-10 DIAGNOSIS — F429 Obsessive-compulsive disorder, unspecified: Secondary | ICD-10-CM | POA: Diagnosis not present

## 2021-08-10 DIAGNOSIS — F411 Generalized anxiety disorder: Secondary | ICD-10-CM | POA: Diagnosis not present

## 2021-08-30 DIAGNOSIS — M79605 Pain in left leg: Secondary | ICD-10-CM | POA: Diagnosis not present

## 2021-08-30 DIAGNOSIS — M79604 Pain in right leg: Secondary | ICD-10-CM | POA: Diagnosis not present

## 2021-08-30 NOTE — Progress Notes (Deleted)
Psychiatric Initial Adult Assessment   Patient Identification: Victoria Patterson MRN:  [id number] Date of Evaluation:  08/30/2021 Referral Source: *** Chief Complaint:  No chief complaint on file.  Visit Diagnosis: No diagnosis found.  History of Present Illness:   Victoria Patterson is a 61 y.o. year old female with a history of depression, OCD (germ phobia), ADHD, history of eating disorder both anorexia and bulimia,  hypertension, diabetes, hypothyroidism. bilateral primary osteoarthritis of knee, who is referred for    Daily routine: Diet:  Exercise: Support: Household:  Marital status: Number of children: Employment:  Education:   Last PCP / ongoing medical evaluation:       Associated Signs/Symptoms: Depression Symptoms:  {DEPRESSION SYMPTOMS:20000} (Hypo) Manic Symptoms:  {BHH MANIC SYMPTOMS:22872} Anxiety Symptoms:  {BHH ANXIETY SYMPTOMS:22873} Psychotic Symptoms:  {BHH PSYCHOTIC SYMPTOMS:22874} PTSD Symptoms: {BHH PTSD SYMPTOMS:22875}  Past Psychiatric History:  Outpatient:  Psychiatry admission:  Previous suicide attempt:  Past trials of medication:  History of violence:    Previous Psychotropic Medications: {YES/NO:21197}  Substance Abuse History in the last 12 months:  {yes no:314532}  Consequences of Substance Abuse: {BHH CONSEQUENCES OF SUBSTANCE ABUSE:22880}  Past Medical History:  Past Medical History:  Diagnosis Date   ADHD (attention deficit hyperactivity disorder)    Anxiety    Arthritis    Asthma    controlled for many years   Depression    Diabetes mellitus, type II (HCC)    Fatty liver    Hypertension    Hypothyroidism    Obsessive-compulsive disorder     Past Surgical History:  Procedure Laterality Date   BREAST CYST ASPIRATION Left 1992   neg   CHOLECYSTECTOMY     dilation and evacuation     x2   HYSTEROSCOPY WITH D & C N/A 02/04/2018   Procedure: DILATATION AND CURETTAGE Victoria Patterson, fractional;  Surgeon: Victoria Patterson, Victoria Austin, MD;  Location: [facility];  Service: Gynecology;  Laterality: N/A;    Family Psychiatric History: ***  Family History:  Family History  Problem Relation Age of Onset   Ovarian cancer Mother 59   Bipolar disorder Mother    Breast cancer Paternal Aunt 20   Breast cancer Cousin        maternal   Alcohol abuse Father     Social History:   Social History   Socioeconomic History   Marital status: Married    Spouse name: Not on file   Number of children: Not on file   Years of education: Not on file   Highest education level: Not on file  Occupational History   Not on file  Tobacco Use   Smoking status: Never   Smokeless tobacco: Never  Vaping Use   Vaping Use: Never used  Substance and Sexual Activity   Alcohol use: No   Drug use: No   Sexual activity: Not on file  Other Topics Concern   Not on file  Social History Narrative   Not on file   Social Determinants of Health   Financial Resource Strain: Not on file  Food Insecurity: Not on file  Transportation Needs: Not on file  Physical Activity: Not on file  Stress: Not on file  Social Connections: Not on file    Additional Social History: ***  Allergies:   Allergies  Allergen Reactions   Penicillins Hives    Has patient had a PCN reaction causing immediate rash, facial/tongue/throat swelling, SOB or lightheadedness with hypotension: No Has patient had a PCN reaction causing  severe rash involving mucus membranes or skin necrosis: Yes Has patient had a PCN reaction that required hospitalization: No Has patient had a PCN reaction occurring within the last 10 years: No If all of the above answers are "NO", then may proceed with Cephalosporin use.    Stadol [Butorphanol] Anxiety    Metabolic Disorder Labs: No results found for: "HGBA1C", "MPG" No results found for: "PROLACTIN" No results found for: "CHOL", "TRIG", "HDL", "CHOLHDL", "VLDL", "LDLCALC" No results found for: "TSH"  Therapeutic Level Labs: No  results found for: "LITHIUM" No results found for: "CBMZ" No results found for: "VALPROATE"  Current Medications: Current Outpatient Medications  Medication Sig Dispense Refill   acetaminophen (TYLENOL) 500 MG tablet Take 500 mg by mouth 2 (two) times daily as needed for moderate pain.     bisoprolol-hydrochlorothiazide (ZIAC) 5-6.25 MG tablet Take 1 tablet by mouth daily.     Ca Carbonate-Mag Hydroxide (ROLAIDS PO) Take 1 tablet by mouth daily as needed (acid reflux).     diclofenac (VOLTAREN) 75 MG EC tablet Take 1 tablet (75 mg total) by mouth 2 (two) times daily after a meal. 60 tablet 2   DULoxetine (CYMBALTA) 60 MG capsule Take 60 mg by mouth daily.     ezetimibe (ZETIA) 10 MG tablet Take 10 mg by mouth daily.     glimepiride (AMARYL) 4 MG tablet Take 4 mg by mouth daily with breakfast.     HYDROcodone-acetaminophen (NORCO/VICODIN) 5-325 MG tablet Take 1 tablet by mouth daily as needed for moderate pain. 30 tablet 0   lamoTRIgine (LAMICTAL) 150 MG tablet Take 150 mg by mouth daily.     levothyroxine (SYNTHROID, LEVOTHROID) 75 MCG tablet Take 75 mcg by mouth daily before breakfast.     metFORMIN (GLUCOPHAGE) 500 MG tablet Take 500 mg by mouth 2 (two) times daily with a meal.     valsartan (DIOVAN) 80 MG tablet Take 80 mg by mouth daily.     No current facility-administered medications for this visit.    Musculoskeletal: Strength & Muscle Tone: within normal limits Gait & Station: normal Patient leans: N/A  Psychiatric Specialty Exam: Review of Systems  There were no vitals taken for this visit.There is no height or weight on file to calculate BMI.  General Appearance: {Appearance:22683}  Eye Contact:  {BHH EYE CONTACT:22684}  Speech:  Clear and Coherent  Volume:  Normal  Mood:  {BHH MOOD:22306}  Affect:  {Affect (PAA):22687}  Thought Process:  Coherent  Orientation:  Full (Time, Place, and Person)  Thought Content:  Logical  Suicidal Thoughts:  {ST/HT (PAA):22692}   Homicidal Thoughts:  {ST/HT (PAA):22692}  Memory:  Immediate;   Good  Judgement:  {Judgement (PAA):22694}  Insight:  {Insight (PAA):22695}  Psychomotor Activity:  Normal  Concentration:  Concentration: Good and Attention Span: Good  Recall:  Good  Fund of Knowledge:Good  Language: Good  Akathisia:  No  Handed:  Right  AIMS (if indicated):  not done  Assets:  Communication Skills Desire for Improvement  ADL's:  Intact  Cognition: WNL  Sleep:  {BHH GOOD/FAIR/POOR:22877}   Screenings: PHQ2-9    Flowsheet Row Office Visit from [facility]11/2018 in [facility] Procedure visit from 02/20/2018 in [facility] Office Visit from 02/12/2018 in [facility] Procedure visit from 01/09/2018 in [facility] Procedure visit from 11/26/2017 in [facility]  PHQ-2 Total Score  0 0 0 0 2  PHQ-9 Total Score -- -- -- -- 10       Assessment and Plan:  Victoria Patterson is a 61 y.o. year old female with a history of depression, OCD (germ phobia), ADHD, history of eating disorder both anorexia and bulimia,  hypertension, diabetes, hypothyroidism. bilateral primary osteoarthritis of knee, who is referred for depression.   Assessment  Plan   The patient demonstrates the following risk factors for suicide: Chronic risk factors for suicide include: {Chronic Risk Factors for BPZWCHE:52778242}. Acute risk factors for suicide include: {Acute Risk Factors for PNTIRWE:31540086}. Protective factors for this patient include: {Protective Factors for Suicide PYPP:50932671}. Considering these factors, the overall suicide risk at this point appears to be {Desc; low/moderate/high:110033}. Patient {ACTION; IS/IS IWP:80998338} appropriate for outpatient follow up.         Collaboration of Care: {BH OP  Collaboration of Care:21014065}  Patient/Guardian was advised Release of Information must be obtained prior to any record release in order to collaborate their care with an outside provider. Patient/Guardian was advised if they have not already done so to contact the registration department to sign all necessary forms in order for [place] to release information regarding their care.   Consent: Patient/Guardian gives verbal consent for treatment and assignment of benefits for services provided during this visit. Patient/Guardian expressed understanding and agreed to proceed.   Neysa Hotter, MD 6/20/20238:27 AM

## 2021-08-31 ENCOUNTER — Ambulatory Visit: Payer: Self-pay | Admitting: Psychiatry

## 2021-09-02 DIAGNOSIS — I10 Essential (primary) hypertension: Secondary | ICD-10-CM | POA: Diagnosis not present

## 2021-09-02 DIAGNOSIS — E118 Type 2 diabetes mellitus with unspecified complications: Secondary | ICD-10-CM | POA: Diagnosis not present

## 2021-09-02 DIAGNOSIS — R519 Headache, unspecified: Secondary | ICD-10-CM | POA: Diagnosis not present

## 2021-09-05 ENCOUNTER — Ambulatory Visit: Payer: Self-pay | Admitting: Psychiatry
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
## 2021-10-04 ENCOUNTER — Encounter: Payer: Self-pay | Admitting: Psychiatry

## 2021-10-04 ENCOUNTER — Ambulatory Visit (INDEPENDENT_AMBULATORY_CARE_PROVIDER_SITE_OTHER): Payer: 59 | Admitting: Psychiatry

## 2021-10-04 VITALS — BP 107/70 | HR 71 | Temp 98.3°F | Ht 64.17 in | Wt 219.8 lb

## 2021-10-04 DIAGNOSIS — G4700 Insomnia, unspecified: Secondary | ICD-10-CM

## 2021-10-04 DIAGNOSIS — F3181 Bipolar II disorder: Secondary | ICD-10-CM

## 2021-10-04 DIAGNOSIS — R69 Illness, unspecified: Secondary | ICD-10-CM | POA: Diagnosis not present

## 2021-10-04 DIAGNOSIS — F429 Obsessive-compulsive disorder, unspecified: Secondary | ICD-10-CM

## 2021-10-06 ENCOUNTER — Telehealth: Payer: Self-pay | Admitting: Psychiatry

## 2021-10-06 NOTE — Telephone Encounter (Signed)
Patient called stating she could not remember where you suggested for therapist or sleep. Was she being referred to [facility]? And then said you mentioned therapist was beside that building. She just could not remember what you had mentioned. I could not locate in chart names of the locations. Please advise

## 2021-10-06 NOTE — Telephone Encounter (Signed)
[facility] evaluation- [facility]. They would contact her for evaluation.  Therapy- She was declined by [facility] psychology. However, she was recommended to try Phobia treatment center in [place] [phone] if she is interested.  Could either of you contact the patient about this? (I sent a message to Jess about the therapy as well, fyi)

## 2021-10-07 NOTE — Telephone Encounter (Signed)
Jess. Could you inform her of the possible therapy resources in [place] area? Thanks.

## 2021-10-10 NOTE — Telephone Encounter (Signed)
left message asking patient to call office back. pt was also advised to call her insurane company and find out who is in network.

## 2021-10-14 DIAGNOSIS — K5900 Constipation, unspecified: Secondary | ICD-10-CM | POA: Diagnosis not present

## 2021-10-14 DIAGNOSIS — R1084 Generalized abdominal pain: Secondary | ICD-10-CM | POA: Diagnosis not present

## 2021-10-14 DIAGNOSIS — R109 Unspecified abdominal pain: Secondary | ICD-10-CM | POA: Diagnosis not present

## 2021-10-25 ENCOUNTER — Institutional Professional Consult (permissible substitution): Payer: Self-pay | Admitting: Pulmonary Disease
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
## 2021-10-31 ENCOUNTER — Ambulatory Visit (INDEPENDENT_AMBULATORY_CARE_PROVIDER_SITE_OTHER): Payer: 59 | Admitting: Psychiatry

## 2021-10-31 ENCOUNTER — Encounter: Payer: Self-pay | Admitting: Psychiatry

## 2021-10-31 VITALS — BP 117/78 | HR 68 | Temp 98.1°F | Wt 220.8 lb

## 2021-10-31 DIAGNOSIS — F429 Obsessive-compulsive disorder, unspecified: Secondary | ICD-10-CM | POA: Diagnosis not present

## 2021-10-31 DIAGNOSIS — E782 Mixed hyperlipidemia: Secondary | ICD-10-CM | POA: Diagnosis not present

## 2021-10-31 DIAGNOSIS — Z79899 Other long term (current) drug therapy: Secondary | ICD-10-CM | POA: Diagnosis not present

## 2021-10-31 DIAGNOSIS — F3181 Bipolar II disorder: Secondary | ICD-10-CM | POA: Diagnosis not present

## 2021-10-31 DIAGNOSIS — E118 Type 2 diabetes mellitus with unspecified complications: Secondary | ICD-10-CM | POA: Diagnosis not present

## 2021-10-31 DIAGNOSIS — R69 Illness, unspecified: Secondary | ICD-10-CM | POA: Diagnosis not present

## 2021-10-31 MED ORDER — LAMOTRIGINE 200 MG PO TABS: 200.0000 mg | ORAL_TABLET | Freq: Every day | ORAL | 0 refills | Status: DC

## 2021-10-31 MED ORDER — DULOXETINE HCL 30 MG PO CPEP: 30.0000 mg | ORAL_CAPSULE | Freq: Every day | ORAL | 0 refills | Status: DC

## 2021-11-03 DIAGNOSIS — R69 Illness, unspecified: Secondary | ICD-10-CM | POA: Diagnosis not present

## 2021-11-04 DIAGNOSIS — G720 Drug-induced myopathy: Secondary | ICD-10-CM | POA: Diagnosis not present

## 2021-11-04 DIAGNOSIS — I10 Essential (primary) hypertension: Secondary | ICD-10-CM | POA: Diagnosis not present

## 2021-11-04 DIAGNOSIS — M25561 Pain in right knee: Secondary | ICD-10-CM | POA: Diagnosis not present

## 2021-11-04 DIAGNOSIS — E039 Hypothyroidism, unspecified: Secondary | ICD-10-CM | POA: Diagnosis not present

## 2021-11-04 DIAGNOSIS — Z79899 Other long term (current) drug therapy: Secondary | ICD-10-CM | POA: Diagnosis not present

## 2021-11-04 DIAGNOSIS — T466X5A Adverse effect of antihyperlipidemic and antiarteriosclerotic drugs, initial encounter: Secondary | ICD-10-CM | POA: Diagnosis not present

## 2021-11-04 DIAGNOSIS — Z1231 Encounter for screening mammogram for malignant neoplasm of breast: Secondary | ICD-10-CM | POA: Diagnosis not present

## 2021-11-04 DIAGNOSIS — E782 Mixed hyperlipidemia: Secondary | ICD-10-CM | POA: Diagnosis not present

## 2021-11-04 DIAGNOSIS — G8929 Other chronic pain: Secondary | ICD-10-CM | POA: Diagnosis not present

## 2021-11-04 DIAGNOSIS — E118 Type 2 diabetes mellitus with unspecified complications: Secondary | ICD-10-CM | POA: Diagnosis not present

## 2021-11-07 ENCOUNTER — Other Ambulatory Visit: Payer: Self-pay | Admitting: Internal Medicine

## 2021-11-07 DIAGNOSIS — Z1231 Encounter for screening mammogram for malignant neoplasm of breast: Secondary | ICD-10-CM

## 2021-11-08 DIAGNOSIS — R69 Illness, unspecified: Secondary | ICD-10-CM | POA: Diagnosis not present

## 2021-11-16 DIAGNOSIS — R69 Illness, unspecified: Secondary | ICD-10-CM | POA: Diagnosis not present

## 2021-11-22 DIAGNOSIS — R69 Illness, unspecified: Secondary | ICD-10-CM | POA: Diagnosis not present

## 2021-11-28 ENCOUNTER — Encounter: Payer: 59 | Admitting: Adult Health

## 2021-11-28 ENCOUNTER — Encounter: Payer: Self-pay | Admitting: Adult Health

## 2021-11-28 ENCOUNTER — Telehealth: Payer: Self-pay

## 2021-11-28 NOTE — Telephone Encounter (Signed)
Patient scheduled for sleep consult. While waiting for NP to go in, patient knocked on exam door and stated that she was having an anxiety attack and she needed to leave. Patient refused to complete visit and stated that she would call back to reschedule.  Appt has been canceled.

## 2021-11-29 DIAGNOSIS — R69 Illness, unspecified: Secondary | ICD-10-CM | POA: Diagnosis not present

## 2021-12-06 DIAGNOSIS — R69 Illness, unspecified: Secondary | ICD-10-CM | POA: Diagnosis not present

## 2021-12-08 ENCOUNTER — Telehealth: Payer: Self-pay

## 2021-12-08 ENCOUNTER — Other Ambulatory Visit: Payer: Self-pay | Admitting: Psychiatry

## 2021-12-08 DIAGNOSIS — M25462 Effusion, left knee: Secondary | ICD-10-CM | POA: Diagnosis not present

## 2021-12-08 DIAGNOSIS — G8929 Other chronic pain: Secondary | ICD-10-CM | POA: Diagnosis not present

## 2021-12-08 DIAGNOSIS — M1712 Unilateral primary osteoarthritis, left knee: Secondary | ICD-10-CM | POA: Diagnosis not present

## 2021-12-08 MED ORDER — HYDROXYZINE HCL 25 MG PO TABS: 25.0000 mg | ORAL_TABLET | Freq: Every day | ORAL | 0 refills | Status: DC | PRN

## 2021-12-08 NOTE — Telephone Encounter (Signed)
pt called she left a message that she is having alot of panic attacks and anxiety and she needs to speak with provider as soon as possible.

## 2021-12-08 NOTE — Telephone Encounter (Signed)
Called the patient.  She states that she had intense anxiety when she was going to the basement for sleep evaluation.  She has another intense anxiety when she went to the orthopedic doctor.  She feels closed in.  Although she feels anxiety regularly, it tends to worsen in those occasions.  She agrees with the following.  - start hydroxyzine 25 mg daily as needed for anxiety
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
## 2021-12-13 ENCOUNTER — Encounter: Payer: Self-pay | Admitting: Psychiatry

## 2021-12-13 ENCOUNTER — Ambulatory Visit: Payer: 59 | Admitting: Psychiatry

## 2021-12-13 VITALS — BP 118/75 | HR 67 | Temp 97.2°F | Ht 63.0 in | Wt 225.0 lb

## 2021-12-13 DIAGNOSIS — F3181 Bipolar II disorder: Secondary | ICD-10-CM | POA: Diagnosis not present

## 2021-12-13 DIAGNOSIS — G4700 Insomnia, unspecified: Secondary | ICD-10-CM | POA: Diagnosis not present

## 2021-12-13 DIAGNOSIS — R69 Illness, unspecified: Secondary | ICD-10-CM | POA: Diagnosis not present

## 2021-12-13 DIAGNOSIS — F40240 Claustrophobia: Secondary | ICD-10-CM

## 2021-12-13 DIAGNOSIS — F429 Obsessive-compulsive disorder, unspecified: Secondary | ICD-10-CM

## 2021-12-13 MED ORDER — DULOXETINE HCL 30 MG PO CPEP: 30.0000 mg | ORAL_CAPSULE | Freq: Every day | ORAL | 0 refills | Status: DC

## 2021-12-13 MED ORDER — ARIPIPRAZOLE 2 MG PO TABS: 2.0000 mg | ORAL_TABLET | Freq: Every day | ORAL | 1 refills | Status: DC

## 2021-12-13 MED ORDER — LAMOTRIGINE 200 MG PO TABS: 200.0000 mg | ORAL_TABLET | Freq: Every day | ORAL | 1 refills | Status: DC

## 2021-12-13 NOTE — Patient Instructions (Signed)
Continue duloxetine 30 mg daily  Start Abilify 2 mg at night  Continue lamotrigine 200 mg daily Referred for evaluation of sleep apnea Next appointment: 10/10 at 8 AM

## 2021-12-20 ENCOUNTER — Ambulatory Visit: Payer: 59 | Admitting: Psychiatry

## 2021-12-20 DIAGNOSIS — R69 Illness, unspecified: Secondary | ICD-10-CM | POA: Diagnosis not present

## 2021-12-27 DIAGNOSIS — R69 Illness, unspecified: Secondary | ICD-10-CM | POA: Diagnosis not present

## 2022-01-10 DIAGNOSIS — R69 Illness, unspecified: Secondary | ICD-10-CM | POA: Diagnosis not present

## 2022-01-24 DIAGNOSIS — R69 Illness, unspecified: Secondary | ICD-10-CM | POA: Diagnosis not present

## 2022-01-30 DIAGNOSIS — Z79899 Other long term (current) drug therapy: Secondary | ICD-10-CM | POA: Diagnosis not present

## 2022-01-30 DIAGNOSIS — E782 Mixed hyperlipidemia: Secondary | ICD-10-CM | POA: Diagnosis not present

## 2022-01-30 DIAGNOSIS — E118 Type 2 diabetes mellitus with unspecified complications: Secondary | ICD-10-CM | POA: Diagnosis not present

## 2022-01-30 DIAGNOSIS — I10 Essential (primary) hypertension: Secondary | ICD-10-CM | POA: Diagnosis not present

## 2022-01-31 DIAGNOSIS — R69 Illness, unspecified: Secondary | ICD-10-CM | POA: Diagnosis not present

## 2022-02-06 DIAGNOSIS — T466X5A Adverse effect of antihyperlipidemic and antiarteriosclerotic drugs, initial encounter: Secondary | ICD-10-CM | POA: Diagnosis not present

## 2022-02-06 DIAGNOSIS — E118 Type 2 diabetes mellitus with unspecified complications: Secondary | ICD-10-CM | POA: Diagnosis not present

## 2022-02-06 DIAGNOSIS — Z1231 Encounter for screening mammogram for malignant neoplasm of breast: Secondary | ICD-10-CM | POA: Diagnosis not present

## 2022-02-06 DIAGNOSIS — E782 Mixed hyperlipidemia: Secondary | ICD-10-CM | POA: Diagnosis not present

## 2022-02-06 DIAGNOSIS — I10 Essential (primary) hypertension: Secondary | ICD-10-CM | POA: Diagnosis not present

## 2022-02-06 DIAGNOSIS — R69 Illness, unspecified: Secondary | ICD-10-CM | POA: Diagnosis not present

## 2022-02-06 DIAGNOSIS — Z01419 Encounter for gynecological examination (general) (routine) without abnormal findings: Secondary | ICD-10-CM | POA: Diagnosis not present

## 2022-02-06 DIAGNOSIS — Z0000 Encounter for general adult medical examination without abnormal findings: Secondary | ICD-10-CM | POA: Diagnosis not present

## 2022-02-06 DIAGNOSIS — Z79899 Other long term (current) drug therapy: Secondary | ICD-10-CM | POA: Diagnosis not present

## 2022-02-06 DIAGNOSIS — G720 Drug-induced myopathy: Secondary | ICD-10-CM | POA: Diagnosis not present

## 2022-02-06 DIAGNOSIS — E039 Hypothyroidism, unspecified: Secondary | ICD-10-CM | POA: Diagnosis not present
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
## 2022-02-09 ENCOUNTER — Encounter: Payer: Self-pay | Admitting: Psychiatry

## 2022-02-09 ENCOUNTER — Ambulatory Visit: Payer: 59 | Admitting: Psychiatry

## 2022-02-09 VITALS — BP 109/70 | HR 69 | Temp 97.6°F | Ht 63.0 in | Wt 219.0 lb

## 2022-02-09 DIAGNOSIS — R69 Illness, unspecified: Secondary | ICD-10-CM | POA: Diagnosis not present

## 2022-02-09 DIAGNOSIS — F429 Obsessive-compulsive disorder, unspecified: Secondary | ICD-10-CM | POA: Diagnosis not present

## 2022-02-09 DIAGNOSIS — F3181 Bipolar II disorder: Secondary | ICD-10-CM

## 2022-02-09 MED ORDER — VENLAFAXINE HCL ER 37.5 MG PO CP24: 37.5000 mg | ORAL_CAPSULE | Freq: Every day | ORAL | 0 refills | Status: DC

## 2022-02-09 MED ORDER — VENLAFAXINE HCL ER 75 MG PO CP24: 75.0000 mg | ORAL_CAPSULE | Freq: Every day | ORAL | 1 refills | Status: DC

## 2022-02-09 NOTE — Patient Instructions (Signed)
Continue duloxetine 30 mg daily  Start venlafaxine 37.5 mg for for one week, then 75 mg daily  Continue lamotrigine 200 mg daily Referred for evaluation of sleep apnea Next appointment: 1/29 at 11 AM

## 2022-02-12 ENCOUNTER — Other Ambulatory Visit: Payer: Self-pay | Admitting: Psychiatry

## 2022-02-14 ENCOUNTER — Ambulatory Visit
Admission: RE | Admit: 2022-02-14 | Discharge: 2022-02-14 | Disposition: A | Discharge status: Home / Self Care | Payer: 59 | Source: Ambulatory Visit | Attending: Internal Medicine | Admitting: Internal Medicine

## 2022-02-14 DIAGNOSIS — Z1231 Encounter for screening mammogram for malignant neoplasm of breast: Secondary | ICD-10-CM | POA: Diagnosis not present

## 2022-02-14 DIAGNOSIS — R69 Illness, unspecified: Secondary | ICD-10-CM | POA: Diagnosis not present

## 2022-02-16 DIAGNOSIS — I10 Essential (primary) hypertension: Secondary | ICD-10-CM | POA: Diagnosis not present

## 2022-02-16 DIAGNOSIS — G720 Drug-induced myopathy: Secondary | ICD-10-CM | POA: Diagnosis not present

## 2022-02-16 DIAGNOSIS — E782 Mixed hyperlipidemia: Secondary | ICD-10-CM | POA: Diagnosis not present

## 2022-02-16 DIAGNOSIS — E6609 Other obesity due to excess calories: Secondary | ICD-10-CM | POA: Diagnosis not present

## 2022-02-16 DIAGNOSIS — T466X5A Adverse effect of antihyperlipidemic and antiarteriosclerotic drugs, initial encounter: Secondary | ICD-10-CM | POA: Diagnosis not present

## 2022-02-16 DIAGNOSIS — Z6838 Body mass index (BMI) 38.0-38.9, adult: Secondary | ICD-10-CM | POA: Diagnosis not present

## 2022-02-20 DIAGNOSIS — Z01419 Encounter for gynecological examination (general) (routine) without abnormal findings: Secondary | ICD-10-CM | POA: Diagnosis not present

## 2022-02-20 DIAGNOSIS — Z124 Encounter for screening for malignant neoplasm of cervix: Secondary | ICD-10-CM | POA: Diagnosis not present

## 2022-02-20 DIAGNOSIS — Z1211 Encounter for screening for malignant neoplasm of colon: Secondary | ICD-10-CM | POA: Diagnosis not present

## 2022-02-27 DIAGNOSIS — B9689 Other specified bacterial agents as the cause of diseases classified elsewhere: Secondary | ICD-10-CM | POA: Diagnosis not present

## 2022-02-27 DIAGNOSIS — L089 Local infection of the skin and subcutaneous tissue, unspecified: Secondary | ICD-10-CM | POA: Diagnosis not present

## 2022-02-28 DIAGNOSIS — R69 Illness, unspecified: Secondary | ICD-10-CM | POA: Diagnosis not present

## 2022-03-28 DIAGNOSIS — R69 Illness, unspecified: Secondary | ICD-10-CM | POA: Diagnosis not present
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
## 2022-04-10 ENCOUNTER — Ambulatory Visit: Payer: 59 | Admitting: Psychiatry

## 2022-04-10 ENCOUNTER — Encounter: Payer: Self-pay | Admitting: Psychiatry

## 2022-04-10 VITALS — BP 109/70 | HR 79 | Temp 98.5°F | Ht 63.0 in | Wt 223.6 lb

## 2022-04-10 DIAGNOSIS — R69 Illness, unspecified: Secondary | ICD-10-CM | POA: Diagnosis not present

## 2022-04-10 DIAGNOSIS — F429 Obsessive-compulsive disorder, unspecified: Secondary | ICD-10-CM | POA: Diagnosis not present

## 2022-04-10 DIAGNOSIS — F3181 Bipolar II disorder: Secondary | ICD-10-CM | POA: Diagnosis not present

## 2022-04-10 DIAGNOSIS — G4700 Insomnia, unspecified: Secondary | ICD-10-CM

## 2022-04-10 MED ORDER — DULOXETINE HCL 30 MG PO CPEP: 30.0000 mg | ORAL_CAPSULE | Freq: Every day | ORAL | 1 refills | Status: DC

## 2022-04-10 NOTE — Patient Instructions (Signed)
Discontinue duloxetine  Start venlafaxine 37.5 mg for one week, then 75 mg daily  Continue lamotrigine 200 mg daily Referred for evaluation of sleep apnea Next appointment: 3/28 at 11:30

## 2022-04-11 DIAGNOSIS — R69 Illness, unspecified: Secondary | ICD-10-CM | POA: Diagnosis not present

## 2022-04-25 DIAGNOSIS — R69 Illness, unspecified: Secondary | ICD-10-CM | POA: Diagnosis not present

## 2022-05-02 DIAGNOSIS — Z79899 Other long term (current) drug therapy: Secondary | ICD-10-CM | POA: Diagnosis not present

## 2022-05-02 DIAGNOSIS — E782 Mixed hyperlipidemia: Secondary | ICD-10-CM | POA: Diagnosis not present

## 2022-05-02 DIAGNOSIS — E118 Type 2 diabetes mellitus with unspecified complications: Secondary | ICD-10-CM | POA: Diagnosis not present

## 2022-05-09 DIAGNOSIS — E6609 Other obesity due to excess calories: Secondary | ICD-10-CM | POA: Diagnosis not present

## 2022-05-09 DIAGNOSIS — T466X5A Adverse effect of antihyperlipidemic and antiarteriosclerotic drugs, initial encounter: Secondary | ICD-10-CM | POA: Diagnosis not present

## 2022-05-09 DIAGNOSIS — E118 Type 2 diabetes mellitus with unspecified complications: Secondary | ICD-10-CM | POA: Diagnosis not present

## 2022-05-09 DIAGNOSIS — F422 Mixed obsessional thoughts and acts: Secondary | ICD-10-CM | POA: Diagnosis not present

## 2022-05-09 DIAGNOSIS — G720 Drug-induced myopathy: Secondary | ICD-10-CM | POA: Diagnosis not present

## 2022-05-09 DIAGNOSIS — I10 Essential (primary) hypertension: Secondary | ICD-10-CM | POA: Diagnosis not present

## 2022-05-09 DIAGNOSIS — Z6838 Body mass index (BMI) 38.0-38.9, adult: Secondary | ICD-10-CM | POA: Diagnosis not present

## 2022-05-09 DIAGNOSIS — E039 Hypothyroidism, unspecified: Secondary | ICD-10-CM | POA: Diagnosis not present

## 2022-05-09 DIAGNOSIS — E782 Mixed hyperlipidemia: Secondary | ICD-10-CM | POA: Diagnosis not present

## 2022-05-09 DIAGNOSIS — Z79899 Other long term (current) drug therapy: Secondary | ICD-10-CM | POA: Diagnosis not present

## 2022-05-23 DIAGNOSIS — F422 Mixed obsessional thoughts and acts: Secondary | ICD-10-CM | POA: Diagnosis not present

## 2022-06-05 DIAGNOSIS — M79671 Pain in right foot: Secondary | ICD-10-CM | POA: Diagnosis not present

## 2022-06-05 DIAGNOSIS — M25512 Pain in left shoulder: Secondary | ICD-10-CM | POA: Diagnosis not present
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
## 2022-06-08 ENCOUNTER — Ambulatory Visit: Payer: 59 | Admitting: Psychiatry

## 2022-06-08 ENCOUNTER — Encounter: Payer: Self-pay | Admitting: Psychiatry

## 2022-06-08 VITALS — BP 139/79 | HR 77 | Temp 97.1°F | Ht 63.0 in | Wt 213.2 lb

## 2022-06-08 DIAGNOSIS — G4700 Insomnia, unspecified: Secondary | ICD-10-CM | POA: Diagnosis not present

## 2022-06-08 DIAGNOSIS — F429 Obsessive-compulsive disorder, unspecified: Secondary | ICD-10-CM | POA: Diagnosis not present

## 2022-06-08 DIAGNOSIS — F3181 Bipolar II disorder: Secondary | ICD-10-CM

## 2022-06-08 MED ORDER — DULOXETINE HCL 60 MG PO CPEP: 60.0000 mg | ORAL_CAPSULE | Freq: Every day | ORAL | 0 refills | Status: DC

## 2022-06-08 NOTE — Patient Instructions (Signed)
Continue duloxetine 60 mg daily  Continue lamotrigine 200 mg daily Referred for evaluation of sleep apnea Next appointment: 6/20 at 11 AM

## 2022-06-20 DIAGNOSIS — F422 Mixed obsessional thoughts and acts: Secondary | ICD-10-CM | POA: Diagnosis not present

## 2022-07-04 DIAGNOSIS — F422 Mixed obsessional thoughts and acts: Secondary | ICD-10-CM | POA: Diagnosis not present

## 2022-07-14 DIAGNOSIS — E118 Type 2 diabetes mellitus with unspecified complications: Secondary | ICD-10-CM | POA: Diagnosis not present

## 2022-07-14 DIAGNOSIS — R42 Dizziness and giddiness: Secondary | ICD-10-CM | POA: Diagnosis not present

## 2022-07-14 DIAGNOSIS — T466X5A Adverse effect of antihyperlipidemic and antiarteriosclerotic drugs, initial encounter: Secondary | ICD-10-CM | POA: Diagnosis not present

## 2022-07-14 DIAGNOSIS — R8290 Unspecified abnormal findings in urine: Secondary | ICD-10-CM | POA: Diagnosis not present

## 2022-07-14 DIAGNOSIS — I10 Essential (primary) hypertension: Secondary | ICD-10-CM | POA: Diagnosis not present

## 2022-07-14 DIAGNOSIS — M79671 Pain in right foot: Secondary | ICD-10-CM | POA: Diagnosis not present

## 2022-07-14 DIAGNOSIS — R5383 Other fatigue: Secondary | ICD-10-CM | POA: Diagnosis not present

## 2022-07-14 DIAGNOSIS — G720 Drug-induced myopathy: Secondary | ICD-10-CM | POA: Diagnosis not present

## 2022-07-14 DIAGNOSIS — R5381 Other malaise: Secondary | ICD-10-CM | POA: Diagnosis not present

## 2022-07-14 DIAGNOSIS — E039 Hypothyroidism, unspecified: Secondary | ICD-10-CM | POA: Diagnosis not present

## 2022-07-14 DIAGNOSIS — Z79899 Other long term (current) drug therapy: Secondary | ICD-10-CM | POA: Diagnosis not present

## 2022-07-14 DIAGNOSIS — R634 Abnormal weight loss: Secondary | ICD-10-CM | POA: Diagnosis not present

## 2022-07-18 DIAGNOSIS — F422 Mixed obsessional thoughts and acts: Secondary | ICD-10-CM | POA: Diagnosis not present

## 2022-07-21 DIAGNOSIS — K5900 Constipation, unspecified: Secondary | ICD-10-CM | POA: Diagnosis not present

## 2022-07-21 DIAGNOSIS — R634 Abnormal weight loss: Secondary | ICD-10-CM | POA: Diagnosis not present

## 2022-07-25 ENCOUNTER — Other Ambulatory Visit: Payer: Self-pay | Admitting: Internal Medicine

## 2022-07-25 DIAGNOSIS — R634 Abnormal weight loss: Secondary | ICD-10-CM

## 2022-07-26 ENCOUNTER — Inpatient Hospital Stay: Payer: 59

## 2022-07-26 ENCOUNTER — Inpatient Hospital Stay
Admission: EM | Admit: 2022-07-26 | Discharge: 2022-07-27 | DRG: 871 | Disposition: A | Discharge status: Home / Self Care | Payer: 59 | Attending: Internal Medicine | Admitting: Internal Medicine

## 2022-07-26 ENCOUNTER — Other Ambulatory Visit: Payer: Self-pay

## 2022-07-26 ENCOUNTER — Emergency Department: Payer: 59

## 2022-07-26 DIAGNOSIS — E162 Hypoglycemia, unspecified: Secondary | ICD-10-CM | POA: Diagnosis not present

## 2022-07-26 DIAGNOSIS — N3000 Acute cystitis without hematuria: Secondary | ICD-10-CM

## 2022-07-26 DIAGNOSIS — G894 Chronic pain syndrome: Secondary | ICD-10-CM | POA: Diagnosis not present

## 2022-07-26 DIAGNOSIS — I959 Hypotension, unspecified: Secondary | ICD-10-CM | POA: Diagnosis not present

## 2022-07-26 DIAGNOSIS — I129 Hypertensive chronic kidney disease with stage 1 through stage 4 chronic kidney disease, or unspecified chronic kidney disease: Secondary | ICD-10-CM | POA: Diagnosis not present

## 2022-07-26 DIAGNOSIS — Z79890 Hormone replacement therapy (postmenopausal): Secondary | ICD-10-CM

## 2022-07-26 DIAGNOSIS — A419 Sepsis, unspecified organism: Principal | ICD-10-CM | POA: Diagnosis present

## 2022-07-26 DIAGNOSIS — N179 Acute kidney failure, unspecified: Secondary | ICD-10-CM | POA: Diagnosis present

## 2022-07-26 DIAGNOSIS — R4182 Altered mental status, unspecified: Secondary | ICD-10-CM | POA: Diagnosis not present

## 2022-07-26 DIAGNOSIS — E039 Hypothyroidism, unspecified: Secondary | ICD-10-CM | POA: Diagnosis present

## 2022-07-26 DIAGNOSIS — E871 Hypo-osmolality and hyponatremia: Secondary | ICD-10-CM | POA: Diagnosis present

## 2022-07-26 DIAGNOSIS — N3090 Cystitis, unspecified without hematuria: Secondary | ICD-10-CM | POA: Diagnosis present

## 2022-07-26 DIAGNOSIS — K760 Fatty (change of) liver, not elsewhere classified: Secondary | ICD-10-CM | POA: Diagnosis present

## 2022-07-26 DIAGNOSIS — F909 Attention-deficit hyperactivity disorder, unspecified type: Secondary | ICD-10-CM | POA: Diagnosis not present

## 2022-07-26 DIAGNOSIS — M109 Gout, unspecified: Secondary | ICD-10-CM | POA: Diagnosis present

## 2022-07-26 DIAGNOSIS — Z6837 Body mass index (BMI) 37.0-37.9, adult: Secondary | ICD-10-CM

## 2022-07-26 DIAGNOSIS — Z880 Allergy status to penicillin: Secondary | ICD-10-CM

## 2022-07-26 DIAGNOSIS — Z7984 Long term (current) use of oral hypoglycemic drugs: Secondary | ICD-10-CM

## 2022-07-26 DIAGNOSIS — F988 Other specified behavioral and emotional disorders with onset usually occurring in childhood and adolescence: Secondary | ICD-10-CM | POA: Diagnosis present

## 2022-07-26 DIAGNOSIS — E1122 Type 2 diabetes mellitus with diabetic chronic kidney disease: Secondary | ICD-10-CM | POA: Diagnosis not present

## 2022-07-26 DIAGNOSIS — Z8041 Family history of malignant neoplasm of ovary: Secondary | ICD-10-CM

## 2022-07-26 DIAGNOSIS — R6520 Severe sepsis without septic shock: Secondary | ICD-10-CM | POA: Diagnosis not present

## 2022-07-26 DIAGNOSIS — Z811 Family history of alcohol abuse and dependence: Secondary | ICD-10-CM

## 2022-07-26 DIAGNOSIS — E669 Obesity, unspecified: Secondary | ICD-10-CM | POA: Diagnosis present

## 2022-07-26 DIAGNOSIS — I10 Essential (primary) hypertension: Secondary | ICD-10-CM | POA: Diagnosis not present

## 2022-07-26 DIAGNOSIS — Z9049 Acquired absence of other specified parts of digestive tract: Secondary | ICD-10-CM

## 2022-07-26 DIAGNOSIS — E11649 Type 2 diabetes mellitus with hypoglycemia without coma: Secondary | ICD-10-CM | POA: Diagnosis present

## 2022-07-26 DIAGNOSIS — F418 Other specified anxiety disorders: Secondary | ICD-10-CM | POA: Diagnosis present

## 2022-07-26 DIAGNOSIS — E785 Hyperlipidemia, unspecified: Secondary | ICD-10-CM | POA: Diagnosis present

## 2022-07-26 DIAGNOSIS — R531 Weakness: Secondary | ICD-10-CM | POA: Diagnosis not present

## 2022-07-26 DIAGNOSIS — T68XXXA Hypothermia, initial encounter: Secondary | ICD-10-CM | POA: Diagnosis present

## 2022-07-26 DIAGNOSIS — E1129 Type 2 diabetes mellitus with other diabetic kidney complication: Secondary | ICD-10-CM | POA: Diagnosis present

## 2022-07-26 DIAGNOSIS — F32A Depression, unspecified: Secondary | ICD-10-CM | POA: Diagnosis present

## 2022-07-26 DIAGNOSIS — F429 Obsessive-compulsive disorder, unspecified: Secondary | ICD-10-CM | POA: Diagnosis not present

## 2022-07-26 DIAGNOSIS — N182 Chronic kidney disease, stage 2 (mild): Secondary | ICD-10-CM | POA: Diagnosis not present

## 2022-07-26 DIAGNOSIS — J45909 Unspecified asthma, uncomplicated: Secondary | ICD-10-CM | POA: Diagnosis not present

## 2022-07-26 DIAGNOSIS — Z79899 Other long term (current) drug therapy: Secondary | ICD-10-CM

## 2022-07-26 DIAGNOSIS — G9341 Metabolic encephalopathy: Secondary | ICD-10-CM | POA: Diagnosis not present

## 2022-07-26 DIAGNOSIS — Z888 Allergy status to other drugs, medicaments and biological substances status: Secondary | ICD-10-CM

## 2022-07-26 DIAGNOSIS — R0689 Other abnormalities of breathing: Secondary | ICD-10-CM | POA: Diagnosis not present

## 2022-07-26 DIAGNOSIS — N390 Urinary tract infection, site not specified: Secondary | ICD-10-CM | POA: Diagnosis present

## 2022-07-26 DIAGNOSIS — B9689 Other specified bacterial agents as the cause of diseases classified elsewhere: Secondary | ICD-10-CM | POA: Diagnosis present

## 2022-07-26 DIAGNOSIS — K5900 Constipation, unspecified: Secondary | ICD-10-CM | POA: Diagnosis present

## 2022-07-26 DIAGNOSIS — F419 Anxiety disorder, unspecified: Secondary | ICD-10-CM | POA: Diagnosis not present

## 2022-07-26 DIAGNOSIS — Z743 Need for continuous supervision: Secondary | ICD-10-CM | POA: Diagnosis not present

## 2022-07-26 DIAGNOSIS — Z818 Family history of other mental and behavioral disorders: Secondary | ICD-10-CM

## 2022-07-26 DIAGNOSIS — Z803 Family history of malignant neoplasm of breast: Secondary | ICD-10-CM

## 2022-07-26 LAB — CBC WITH DIFFERENTIAL/PLATELET
Abs Immature Granulocytes: 0.09 10*3/uL — ABNORMAL HIGH (ref 0.00–0.07)
Basophils Absolute: 0 10*3/uL (ref 0.0–0.1)
Basophils Relative: 0 %
Eosinophils Absolute: 0.1 10*3/uL (ref 0.0–0.5)
Eosinophils Relative: 1 %
HCT: 38.3 % (ref 36.0–46.0)
Hemoglobin: 13.3 g/dL (ref 12.0–15.0)
Immature Granulocytes: 1 %
Lymphocytes Relative: 16 %
Lymphs Abs: 2.2 10*3/uL (ref 0.7–4.0)
MCH: 30.5 pg (ref 26.0–34.0)
MCHC: 34.7 g/dL (ref 30.0–36.0)
MCV: 87.8 fL (ref 80.0–100.0)
Monocytes Absolute: 0.8 10*3/uL (ref 0.1–1.0)
Monocytes Relative: 6 %
Neutro Abs: 10.3 10*3/uL — ABNORMAL HIGH (ref 1.7–7.7)
Neutrophils Relative %: 76 %
Platelets: 439 10*3/uL — ABNORMAL HIGH (ref 150–400)
RBC: 4.36 MIL/uL (ref 3.87–5.11)
RDW: 13.2 % (ref 11.5–15.5)
WBC: 13.4 10*3/uL — ABNORMAL HIGH (ref 4.0–10.5)
nRBC: 0 % (ref 0.0–0.2)

## 2022-07-26 LAB — OSMOLALITY: Osmolality: 270 mOsm/kg — ABNORMAL LOW (ref 275–295)

## 2022-07-26 LAB — URINALYSIS, ROUTINE W REFLEX MICROSCOPIC
Bilirubin Urine: NEGATIVE
Glucose, UA: NEGATIVE mg/dL
Hgb urine dipstick: NEGATIVE
Ketones, ur: NEGATIVE mg/dL
Nitrite: NEGATIVE
Protein, ur: NEGATIVE mg/dL
Specific Gravity, Urine: 1.014 (ref 1.005–1.030)
pH: 6 (ref 5.0–8.0)

## 2022-07-26 LAB — CBG MONITORING, ED
Glucose-Capillary: 111 mg/dL — ABNORMAL HIGH (ref 70–99)
Glucose-Capillary: 112 mg/dL — ABNORMAL HIGH (ref 70–99)
Glucose-Capillary: 119 mg/dL — ABNORMAL HIGH (ref 70–99)
Glucose-Capillary: 134 mg/dL — ABNORMAL HIGH (ref 70–99)
Glucose-Capillary: 145 mg/dL — ABNORMAL HIGH (ref 70–99)
Glucose-Capillary: 49 mg/dL — ABNORMAL LOW (ref 70–99)
Glucose-Capillary: 93 mg/dL (ref 70–99)

## 2022-07-26 LAB — COMPREHENSIVE METABOLIC PANEL
ALT: 21 U/L (ref 0–44)
AST: 29 U/L (ref 15–41)
Albumin: 4.4 g/dL (ref 3.5–5.0)
Alkaline Phosphatase: 60 U/L (ref 38–126)
Anion gap: 12 (ref 5–15)
BUN: 22 mg/dL (ref 8–23)
CO2: 26 mmol/L (ref 22–32)
Calcium: 9.8 mg/dL (ref 8.9–10.3)
Chloride: 89 mmol/L — ABNORMAL LOW (ref 98–111)
Creatinine, Ser: 1.24 mg/dL — ABNORMAL HIGH (ref 0.44–1.00)
GFR, Estimated: 49 mL/min — ABNORMAL LOW (ref >60)
Glucose, Bld: 52 mg/dL — ABNORMAL LOW (ref 70–99)
Potassium: 4.8 mmol/L (ref 3.5–5.1)
Sodium: 127 mmol/L — ABNORMAL LOW (ref 135–145)
Total Bilirubin: 0.8 mg/dL (ref 0.3–1.2)
Total Protein: 7.4 g/dL (ref 6.5–8.1)

## 2022-07-26 LAB — URINE DRUG SCREEN, QUALITATIVE (ARMC ONLY)
Amphetamines, Ur Screen: NOT DETECTED
Barbiturates, Ur Screen: NOT DETECTED
Benzodiazepine, Ur Scrn: NOT DETECTED
Cannabinoid 50 Ng, Ur ~~LOC~~: NOT DETECTED
Cocaine Metabolite,Ur ~~LOC~~: NOT DETECTED
MDMA (Ecstasy)Ur Screen: NOT DETECTED
Methadone Scn, Ur: NOT DETECTED
Opiate, Ur Screen: NOT DETECTED
Phencyclidine (PCP) Ur S: NOT DETECTED
Tricyclic, Ur Screen: NOT DETECTED

## 2022-07-26 LAB — LACTIC ACID, PLASMA
Lactic Acid, Venous: 4 mmol/L (ref 0.5–1.9)
Lactic Acid, Venous: 2.6 mmol/L (ref 0.5–1.9)
Lactic Acid, Venous: 2.2 mmol/L (ref 0.5–1.9)
Lactic Acid, Venous: 1.7 mmol/L (ref 0.5–1.9)

## 2022-07-26 LAB — BASIC METABOLIC PANEL
Anion gap: 9 (ref 5–15)
Anion gap: 8 (ref 5–15)
BUN: 21 mg/dL (ref 8–23)
BUN: 14 mg/dL (ref 8–23)
CO2: 25 mmol/L (ref 22–32)
CO2: 26 mmol/L (ref 22–32)
Calcium: 9 mg/dL (ref 8.9–10.3)
Calcium: 9.1 mg/dL (ref 8.9–10.3)
Chloride: 90 mmol/L — ABNORMAL LOW (ref 98–111)
Chloride: 97 mmol/L — ABNORMAL LOW (ref 98–111)
Creatinine, Ser: 1.19 mg/dL — ABNORMAL HIGH (ref 0.44–1.00)
Creatinine, Ser: 1.15 mg/dL — ABNORMAL HIGH (ref 0.44–1.00)
GFR, Estimated: 52 mL/min — ABNORMAL LOW (ref >60)
GFR, Estimated: 54 mL/min — ABNORMAL LOW (ref >60)
Glucose, Bld: 125 mg/dL — ABNORMAL HIGH (ref 70–99)
Glucose, Bld: 95 mg/dL (ref 70–99)
Potassium: 3.9 mmol/L (ref 3.5–5.1)
Potassium: 4.9 mmol/L (ref 3.5–5.1)
Sodium: 124 mmol/L — ABNORMAL LOW (ref 135–145)
Sodium: 131 mmol/L — ABNORMAL LOW (ref 135–145)

## 2022-07-26 LAB — PROTIME-INR
INR: 1 (ref 0.8–1.2)
Prothrombin Time: 13.5 seconds (ref 11.4–15.2)

## 2022-07-26 LAB — SODIUM, URINE, RANDOM: Sodium, Ur: 49 mmol/L

## 2022-07-26 LAB — PROCALCITONIN: Procalcitonin: <0.1 ng/mL

## 2022-07-26 LAB — MAGNESIUM: Magnesium: 2.3 mg/dL (ref 1.7–2.4)

## 2022-07-26 LAB — OSMOLALITY, URINE: Osmolality, Ur: 450 mOsm/kg (ref 300–900)

## 2022-07-26 LAB — PHOSPHORUS: Phosphorus: 2 mg/dL — ABNORMAL LOW (ref 2.5–4.6)

## 2022-07-26 MED ORDER — HYDRALAZINE HCL 20 MG/ML IJ SOLN: 5.0000 mg | INTRAMUSCULAR | Status: DC | PRN

## 2022-07-26 MED ORDER — ONDANSETRON HCL 4 MG/2ML IJ SOLN: 4.0000 mg | Freq: Three times a day (TID) | INTRAMUSCULAR | Status: DC | PRN

## 2022-07-26 MED ORDER — ALBUTEROL SULFATE (2.5 MG/3ML) 0.083% IN NEBU: 2.5000 mg | INHALATION_SOLUTION | RESPIRATORY_TRACT | Status: DC | PRN

## 2022-07-26 MED ORDER — CA CARBONATE-MAG HYDROXIDE 550-110 MG PO CHEW: CHEWABLE_TABLET | Freq: Every day | ORAL | Status: DC | PRN

## 2022-07-26 MED ORDER — MECLIZINE HCL 25 MG PO TABS: 25.0000 mg | ORAL_TABLET | Freq: Three times a day (TID) | ORAL | Status: DC | PRN

## 2022-07-26 MED ORDER — SODIUM CHLORIDE 0.9 % IV SOLN: INTRAVENOUS | Status: DC

## 2022-07-26 MED ORDER — LEVOTHYROXINE SODIUM 75 MCG PO TABS
75.0000 ug | ORAL_TABLET | Freq: Every day | ORAL | Status: DC
  Administered 2022-07-27: 75 ug via ORAL
  Filled 2022-07-26: qty 1
  Filled 2022-07-26: qty 3

## 2022-07-26 MED ORDER — SENNOSIDES-DOCUSATE SODIUM 8.6-50 MG PO TABS: 1.0000 | ORAL_TABLET | Freq: Every evening | ORAL | Status: DC | PRN

## 2022-07-26 MED ORDER — ACETAMINOPHEN 325 MG PO TABS: 650.0000 mg | ORAL_TABLET | Freq: Four times a day (QID) | ORAL | Status: DC | PRN

## 2022-07-26 MED ORDER — POTASSIUM PHOSPHATES 15 MMOLE/5ML IV SOLN
15.0000 mmol | Freq: Once | INTRAVENOUS | Status: AC
  Administered 2022-07-26: 15 mmol via INTRAVENOUS
  Filled 2022-07-26: qty 5

## 2022-07-26 MED ORDER — COLCHICINE 0.6 MG PO TABS
0.6000 mg | ORAL_TABLET | Freq: Every day | ORAL | Status: DC
  Administered 2022-07-26: 0.6 mg via ORAL
  Filled 2022-07-26 (×2): qty 1

## 2022-07-26 MED ORDER — LACTATED RINGERS IV BOLUS (SEPSIS)
1000.0000 mL | Freq: Once | INTRAVENOUS | Status: AC
  Administered 2022-07-26: 1000 mL via INTRAVENOUS

## 2022-07-26 MED ORDER — LAMOTRIGINE 100 MG PO TABS
200.0000 mg | ORAL_TABLET | Freq: Every day | ORAL | Status: DC
  Administered 2022-07-26: 200 mg via ORAL
  Filled 2022-07-26: qty 2

## 2022-07-26 MED ORDER — LACTATED RINGERS IV SOLN: INTRAVENOUS | Status: DC

## 2022-07-26 MED ORDER — ALLOPURINOL 100 MG PO TABS
100.0000 mg | ORAL_TABLET | Freq: Every day | ORAL | Status: DC
  Administered 2022-07-26: 100 mg via ORAL
  Filled 2022-07-26 (×2): qty 1

## 2022-07-26 MED ORDER — SODIUM CHLORIDE 1 G PO TABS
1.0000 g | ORAL_TABLET | Freq: Two times a day (BID) | ORAL | Status: DC
  Administered 2022-07-26 – 2022-07-27 (×3): 1 g via ORAL
  Filled 2022-07-26 (×3): qty 1

## 2022-07-26 MED ORDER — POLYETHYLENE GLYCOL 3350 17 G PO PACK: 17.0000 g | PACK | Freq: Every day | ORAL | Status: DC | PRN

## 2022-07-26 MED ORDER — DEXTROSE 50 % IV SOLN
1.0000 | Freq: Once | INTRAVENOUS | Status: AC
  Administered 2022-07-26: 50 mL via INTRAVENOUS
  Filled 2022-07-26: qty 50

## 2022-07-26 MED ORDER — EZETIMIBE 10 MG PO TABS
10.0000 mg | ORAL_TABLET | Freq: Every day | ORAL | Status: DC
  Administered 2022-07-26: 10 mg via ORAL
  Filled 2022-07-26: qty 1

## 2022-07-26 MED ORDER — SODIUM CHLORIDE 0.9 % IV BOLUS
500.0000 mL | Freq: Once | INTRAVENOUS | Status: AC
  Administered 2022-07-26: 500 mL via INTRAVENOUS

## 2022-07-26 MED ORDER — DM-GUAIFENESIN ER 30-600 MG PO TB12: 1.0000 | ORAL_TABLET | Freq: Two times a day (BID) | ORAL | Status: DC | PRN

## 2022-07-26 MED ORDER — ENOXAPARIN SODIUM 60 MG/0.6ML IJ SOSY
50.0000 mg | PREFILLED_SYRINGE | INTRAMUSCULAR | Status: DC
  Administered 2022-07-26: 50 mg via SUBCUTANEOUS
  Filled 2022-07-26: qty 0.6

## 2022-07-26 MED ORDER — METHOCARBAMOL 500 MG PO TABS
500.0000 mg | ORAL_TABLET | Freq: Three times a day (TID) | ORAL | Status: DC | PRN
  Filled 2022-07-26: qty 1

## 2022-07-26 MED ORDER — CALCIUM CARBONATE ANTACID 500 MG PO CHEW: 1.0000 | CHEWABLE_TABLET | Freq: Every day | ORAL | Status: DC | PRN

## 2022-07-26 MED ORDER — DEXTROSE 50 % IV SOLN
50.0000 mL | INTRAVENOUS | Status: DC | PRN
  Administered 2022-07-26: 50 mL via INTRAVENOUS
  Filled 2022-07-26: qty 50

## 2022-07-26 MED ORDER — SODIUM CHLORIDE 0.9 % IV SOLN
2.0000 g | INTRAVENOUS | Status: DC
  Administered 2022-07-26 – 2022-07-27 (×2): 2 g via INTRAVENOUS
  Filled 2022-07-26 (×2): qty 20

## 2022-07-26 MED ORDER — DULOXETINE HCL 30 MG PO CPEP
30.0000 mg | ORAL_CAPSULE | Freq: Every day | ORAL | Status: DC
  Filled 2022-07-26: qty 1

## 2022-07-26 NOTE — Sepsis Progress Note (Signed)
Code Sepsis protocol being monitored by eLink. 

## 2022-07-26 NOTE — ED Provider Notes (Signed)
[facility] Provider Note    Event Date/Time   First MD Initiated Contact with Patient 07/26/22 [phone]     (approximate)   History   Altered Mental Status  Level 5 caveat:  history/ROS limited by acute/critical illness  HPI Victoria Patterson is a 62 y.o. female whose history includes gout, apparently a recent UTI, and depression, as well as chronic pain syndrome and diabetes.  She presents by EMS for altered mental status.  EMS was called to the patient's house for "not acting right".  EMS said that the husband called EMS when he found her sitting on a couch downstairs but confused and seemingly with bizarre behavior.  She said that she has been having cramps in her legs but that is not unusual.  She said that she is not hurting right now.  She denies any shortness of breath and fever.  She is able to answer questions but does seem confused about the reason for her being in the emergency department.  She is pleasant and interactive, however, and denies any current distress.     Physical Exam   Triage Vital Signs: ED Triage Vitals  Enc Vitals Group     BP 07/26/22 0607 (!) 143/98     Pulse Rate 07/26/22 0606 61     Resp 07/26/22 0606 (!) 21     Temp 07/26/22 0606 (!) 96.3 F (35.7 C)     Temp Source 07/26/22 0606 Rectal     SpO2 07/26/22 0606 100 %     Weight 07/26/22 0604 97.1 kg (214 lb)     Height 07/26/22 0604 1.6 m (5\' 3" )     Head Circumference --      Peak Flow --      Pain Score 07/26/22 0604 0     Pain Loc --      Pain Edu? --      Excl. in GC? --     Most recent vital signs: Vitals:   07/26/22 0607 07/26/22 0721  BP: (!) 143/98   Pulse:    Resp:    Temp:  (!) 97.2 F (36.2 C)  SpO2:      General: Awake, alert but confused with GCS 14.  Nontoxic appearance. CV:  Good peripheral perfusion.  Regular rate and rhythm. Resp:  Normal effort. Speaking easily and comfortably, no accessory muscle usage nor intercostal retractions.  Lungs clear  to auscultation. Abd:  No distention.  No tenderness to palpation the abdomen. Other:  No focal neurological deficits appreciated.  No dysarthria no aphasia.  Equal grip strength and moving all 4 extremities without difficulty.   ED Results / Procedures / Treatments   Labs (all labs ordered are listed, but only abnormal results are displayed) Labs Reviewed  COMPREHENSIVE METABOLIC PANEL - Abnormal; Notable for the following components:      Result Value   Sodium 127 (*)    Chloride 89 (*)    Glucose, Bld 52 (*)    Creatinine, Ser 1.24 (*)    GFR, Estimated 49 (*)    All other components within normal limits  LACTIC ACID, PLASMA - Abnormal; Notable for the following components:   Lactic Acid, Venous 4.0 (*)    All other components within normal limits  CBC WITH DIFFERENTIAL/PLATELET - Abnormal; Notable for the following components:   WBC 13.4 (*)    Platelets 439 (*)    Neutro Abs 10.3 (*)    Abs Immature Granulocytes 0.09 (*)  All other components within normal limits  URINALYSIS, ROUTINE W REFLEX MICROSCOPIC - Abnormal; Notable for the following components:   Color, Urine YELLOW (*)    APPearance HAZY (*)    Leukocytes,Ua MODERATE (*)    Bacteria, UA RARE (*)    All other components within normal limits  CULTURE, BLOOD (ROUTINE X 2)  CULTURE, BLOOD (ROUTINE X 2)  URINE CULTURE  PROTIME-INR  MAGNESIUM  LACTIC ACID, PLASMA     EKG  ED ECG REPORT I, Loleta Rose, the attending physician, personally viewed and interpreted this ECG.  Date: 07/26/2022 EKG Time: 06:05 Rate: 61 Rhythm: normal sinus rhythm QRS Axis: Left axis deviation Intervals: normal ST/T Wave abnormalities: Non-specific ST segment / T-wave changes, but no clear evidence of acute ischemia. Narrative Interpretation: no definitive evidence of acute ischemia; does not meet STEMI criteria.    RADIOLOGY I viewed and interpreted the patient's 1 view chest x-ray.  I see no evidence of pneumonia.  I  also read the radiologist's report, which confirmed no acute findings.   PROCEDURES:  Critical Care performed: Yes, see critical care procedure note(s)  .Critical Care  Performed by: Loleta Rose, MD Authorized by: Loleta Rose, MD   Critical care provider statement:    Critical care time (minutes):  30   Critical care time was exclusive of:  Separately billable procedures and treating other patients   Critical care was necessary to treat or prevent imminent or life-threatening deterioration of the following conditions:  Sepsis   Critical care was time spent personally by me on the following activities:  Development of treatment plan with patient or surrogate, evaluation of patient's response to treatment, examination of patient, obtaining history from patient or surrogate, ordering and performing treatments and interventions, ordering and review of laboratory studies, ordering and review of radiographic studies, pulse oximetry, re-evaluation of patient's condition and review of old charts .1-3 Lead EKG Interpretation  Performed by: Loleta Rose, MD Authorized by: Loleta Rose, MD     Interpretation: normal     ECG rate:  62   ECG rate assessment: normal     Rhythm: sinus rhythm     Ectopy: none     Conduction: normal       IMPRESSION / MDM / ASSESSMENT AND PLAN / ED COURSE  I reviewed the triage vital signs and the nursing notes.                              Differential diagnosis includes, but is not limited to, sepsis, UTI, pneumonia, less likely acute intracranial hemorrhage.  Altered mental status/psychosis due to prednisone is also possible.  Electrolyte or metabolic abnormality also possible.  Patient's presentation is most consistent with acute presentation with potential threat to life or bodily function.  Labs/studies ordered: I ordered standard sepsis labs including the following: blood cultures x2, pro time-INR, CMP, urinalysis, urine culture, lactic acid, CBC  with differential, lipase, EKG, 1 view chest x-ray  Interventions/Medications given:  Medications  lactated ringers bolus 1,000 mL (1,000 mLs Intravenous New Bag/Given 07/26/22 0701)  cefTRIAXone (ROCEPHIN) 2 g in sodium chloride 0.9 % 100 mL IVPB (2 g Intravenous New Bag/Given 07/26/22 0705)  lactated ringers bolus 1,000 mL (1,000 mLs Intravenous New Bag/Given 07/26/22 0721)  dextrose 50 % solution 50 mL (50 mLs Intravenous Given 07/26/22 0716)    (Note:  hospital course my include additional interventions and/or labs/studies not listed above.)   Strongly suspect  sepsis, likely UTI.  Full workup pending.  Will hold off on calling code sepsis until little bit more data is available.  No evidence of neurological deficit other than the confusion and I do not think the patient would benefit from a CT head at this time but if her clinical status changes I will proceed with intracranial imaging.  Patient is in no distress and speaking without difficulty with me at this time and is not reporting any discomfort.  She is on a Lawyer for rewarming initiated by the nursing staff even before I came into the room.  No respiratory symptoms.  The patient is on the cardiac monitor to evaluate for evidence of arrhythmia and/or significant heart rate changes.   Clinical Course as of 07/26/22 0726  Wed Jul 26, 2022  0654 In addition to the patient's hypothermia, she has a leukocytosis of 13.4 and a positive urinalysis with leukocytes, bacteria, and WBCs.  She meets criteria for sepsis.  I activated code sepsis and ordered LR 1 L IV bolus since the lactic acid is still pending and she is not hypotensive, suggesting that she is not suffering from severe sepsis.  I also ordered the recommended dose of ceftriaxone 2 g IV for empiric treatment of community-acquired urinary tract infection.  Additional lab work is still pending at this time. [CF]  0707 Lactic acid of 4 suggesting severe sepsis. [CF]  0709 I ordered  additional 1 L of IV fluids based on the severe sepsis presentation.  This target is based on ideal body weight which technically makes the target 1.6 L, but I feel that 2 L is appropriate under the circumstances. [CF]  [phone] Consulting hospitalist for admission.  Dr. Vicente Males is my relief EDP and he will discuss the case with the hospitalist when they call. [CF]  0710 Comprehensive metabolic panel(!) Patient's sodium is low and glucose is 52.  I am ordering 1 amp of D50 as well. [CF]    Clinical Course User Index [CF] Loleta Rose, MD     FINAL CLINICAL IMPRESSION(S) / ED DIAGNOSES   Final diagnoses:  Severe sepsis (HCC)  Urinary tract infection without hematuria, site unspecified  Altered mental status, unspecified altered mental status type  Hypoglycemia     Rx / DC Orders   ED Discharge Orders     None        Note:  This document was prepared using Dragon voice recognition software and may include unintentional dictation errors.   Loleta Rose, MD 07/26/22 [phone]

## 2022-07-26 NOTE — ED Notes (Signed)
Bear hugger applied as well as warm blankets. Barrier placed between pt skin and warmer.

## 2022-07-26 NOTE — ED Notes (Signed)
Pt taken to bathroom in wheelchair, pt able to ambulate to and from with steady gait.

## 2022-07-26 NOTE — Progress Notes (Signed)
CODE SEPSIS - PHARMACY COMMUNICATION  **Broad Spectrum Antibiotics should be administered within 1 hour of Sepsis diagnosis**  Time Code Sepsis Called/Page Received: 1610  Antibiotics Ordered: Ceftriaxone  Time of 1st antibiotic administration: 0715  Otelia Sergeant, PharmD, [facility] 07/26/2022 7:11 AM

## 2022-07-26 NOTE — H&P (Signed)
History and Physical    [name]:[id number] DOB: 06-21-1960 DOA: 07/26/2022  Referring MD/NP/PA:   PCP: Marguarite Arbour, MD   Patient coming from:  The patient is coming from home.     Chief Complaint: AMS  HPI: Victoria Patterson is a 62 y.o. female with medical history significant of gout, hypertension, hyperlipidemia, diabetes mellitus, asthma, hypothyroidism, depression with anxiety, OCD, ADHD, CKD-II, who presents with altered mental status.  Per her husband and son at the bedside, patient was recently treated for right great toe gout with prednisone and UTI with Bactrim.  Patient finished 7-day course of Bactrim treatment.  Her right great toe pain has almost resolved after taking prednisone. Since yesterday, patient has been confused, not acting right. Pt was found her sitting on a couch downstairs, with confusion and bizarre behavior. When I saw pt in ED, she is mildly confused, but still orientated x 3.  Patient moves all extremities normally.  No unilateral numbness or tinglings.  No facial droop or slurred speech.  Patient feels dizzy when standing up.  Patient denies chest pain, cough, shortness of breath.  No fever or chills.  Denies nausea, vomiting, diarrhea or abdominal pain.  Patient has been constipated for about 1 week.  Patient states that she had increased urinary frequency before taking Bactrim, which is better now.  Denies dysuria, burning on urination.  No hematuria. She said that she has been having cramps in her legs but that is not unusual.     Data reviewed independently and ED Course: pt was found to have WBC 13.4, lactic acid 4.0, UA (hazy appearance, moderate amount of leukocyte, rare bacteria, WBC 21-50, squamous cell 6-10), sodium 127, magnesium 2.3, worsening renal function, hypoglycemia with CBG 52.  Hypothermia with  temperature 97.2, heart rate 61, blood pressure 143/98, RR 21.  Chest x-ray negative.  Patient is admitted to PCU as inpatient.   EKG:  I have personally reviewed.  Sinus rhythm, QTc 412, LAD.   Review of Systems:   General: no fevers, chills, no body weight gain, has fatigue HEENT: no blurry vision, hearing changes or sore throat Respiratory: no dyspnea, coughing, wheezing CV: no chest pain, no palpitations GI: no nausea, vomiting, abdominal pain, diarrhea, has constipation GU: no dysuria, burning on urination, has increased urinary frequency, no hematuria  Ext: no leg edema Neuro: no unilateral weakness, numbness, or tingling, no vision change or hearing loss. Has confusion. Skin: no rash, no skin tear. MSK: No muscle spasm, no deformity, no limitation of range of movement in spin. has leg cramps Heme: No easy bruising.  Travel history: No recent long distant travel.   Allergy:  Allergies  Allergen Reactions   Penicillins Hives    Has patient had a PCN reaction causing immediate rash, facial/tongue/throat swelling, SOB or lightheadedness with hypotension: No Has patient had a PCN reaction causing severe rash involving mucus membranes or skin necrosis: Yes Has patient had a PCN reaction that required hospitalization: No Has patient had a PCN reaction occurring within the last 10 years: No If all of the above answers are "NO", then may proceed with Cephalosporin use.    Stadol [Butorphanol] Anxiety    Past Medical History:  Diagnosis Date   ADHD (attention deficit hyperactivity disorder)    Anxiety    Arthritis    Asthma    controlled for many years   Depression    Diabetes mellitus, type II (HCC)    Fatty liver  Hypertension    Hypothyroidism    Obsessive-compulsive disorder     Past Surgical History:  Procedure Laterality Date   BREAST CYST ASPIRATION Left 1992   neg   CHOLECYSTECTOMY     dilation and evacuation     x2   HYSTEROSCOPY WITH D & C N/A 02/04/2018   Procedure: DILATATION AND CURETTAGE Melton Krebs, fractional;  Surgeon: Schermerhorn, Ihor Austin, MD;  Location: [facility] ORS;  Service:  Gynecology;  Laterality: N/A;    Social History:  reports that she has never smoked. She has never used smokeless tobacco. She reports that she does not drink alcohol and does not use drugs.  Family History:  Family History  Problem Relation Age of Onset   Ovarian cancer Mother 39   Bipolar disorder Mother    Breast cancer Paternal Aunt 26   Breast cancer Cousin        maternal   Alcohol abuse Father      Prior to Admission medications   Medication Sig Start Date End Date Taking? Authorizing Provider  bisoprolol-hydrochlorothiazide (ZIAC) 5-6.25 MG tablet Take 1 tablet by mouth daily.    [provider]  Ca Carbonate-Mag Hydroxide (ROLAIDS PO) Take 1 tablet by mouth daily as needed (acid reflux).    [provider]  cetirizine (ZYRTEC) 10 MG chewable tablet Chew 10 mg by mouth daily.    [provider]  colchicine 0.6 MG tablet Take 0.6 mg by mouth daily.    [provider]  DULoxetine (CYMBALTA) 30 MG capsule Take 1 capsule (30 mg total) by mouth daily. 04/10/22 06/09/22  Neysa Hotter, MD  DULoxetine (CYMBALTA) 60 MG capsule Take 1 capsule (60 mg total) by mouth daily. 06/08/22 09/06/22  Neysa Hotter, MD  ezetimibe (ZETIA) 10 MG tablet Take 10 mg by mouth daily.    [provider]  glimepiride (AMARYL) 4 MG tablet Take 4 mg by mouth daily with breakfast.    [provider]  hydrOXYzine (ATARAX) 25 MG tablet Take 1 tablet (25 mg total) by mouth daily as needed for anxiety. 12/08/21   Neysa Hotter, MD  lamoTRIgine (LAMICTAL) 200 MG tablet Take 1 tablet (200 mg total) by mouth daily. 01/30/22 07/29/22  Neysa Hotter, MD  levothyroxine (SYNTHROID, LEVOTHROID) 75 MCG tablet Take 75 mcg by mouth daily before breakfast.    [provider]  metFORMIN (GLUCOPHAGE) 500 MG tablet Take by mouth daily.    [provider]  valsartan (DIOVAN) 80 MG tablet Take 80 mg by mouth daily.    [provider]  venlafaxine XR  (EFFEXOR-XR) 37.5 MG 24 hr capsule Take 1 capsule (37.5 mg total) by mouth daily with breakfast for 7 days. 02/09/22 02/16/22  Neysa Hotter, MD  venlafaxine XR (EFFEXOR-XR) 75 MG 24 hr capsule Take 1 capsule (75 mg total) by mouth daily with breakfast. Start after completing 37.5 mg daily for one week 02/17/22 04/18/22  Neysa Hotter, MD    Physical Exam: Vitals:   07/26/22 0604 07/26/22 0606 07/26/22 0607 07/26/22 0721  BP:   (!) 143/98   Pulse:  61    Resp:  (!) 21    Temp:  (!) 96.3 F (35.7 C)  (!) 97.2 F (36.2 C)  TempSrc:  Rectal  Oral  SpO2:  100%    Weight: 97.1 kg     Height: 5\' 3"  (1.6 m)      General: Not in acute distress HEENT:       Eyes: PERRL, EOMI, no scleral icterus.  ENT: No discharge from the ears and nose, no pharynx injection, no tonsillar enlargement.        Neck: No JVD, no bruit, no mass felt. Heme: No neck lymph node enlargement. Cardiac: S1/S2, RRR, No murmurs, No gallops or rubs. Respiratory: No rales, wheezing, rhonchi or rubs. GI: Soft, nondistended, nontender, no rebound pain, no organomegaly, BS present. GU: No hematuria Ext: No pitting leg edema bilaterally. 1+DP/PT pulse bilaterally. Musculoskeletal: No joint deformities, No joint redness or warmth, no limitation of ROM in spin. Skin: No rashes.  Neuro: mildly confused, still oriented X3, cranial nerves II-XII grossly intact, moves all extremities normally.  Psych: Patient is not psychotic, no suicidal or hemocidal ideation.  Labs on Admission: I have personally reviewed following labs and imaging studies  CBC: Recent Labs  Lab 07/26/22 0628  WBC 13.4*  NEUTROABS 10.3*  HGB 13.3  HCT 38.3  MCV 87.8  PLT 439*   Basic Metabolic Panel: Recent Labs  Lab 07/26/22 0628 07/26/22 0759  NA 127* 124*  K 4.8 3.9  CL 89* 90*  CO2 26 25  GLUCOSE 52* 125*  BUN 22 21  CREATININE 1.24* 1.19*  CALCIUM 9.8 9.0  MG 2.3  --   PHOS  --  2.0*   GFR: Estimated Creatinine Clearance: 54.4  mL/min (A) (by C-G formula based on SCr of 1.19 mg/dL (H)). Liver Function Tests: Recent Labs  Lab 07/26/22 0628  AST 29  ALT 21  ALKPHOS 60  BILITOT 0.8  PROT 7.4  ALBUMIN 4.4   No results for input(s): "LIPASE", "AMYLASE" in the last 168 hours. No results for input(s): "AMMONIA" in the last 168 hours. Coagulation Profile: Recent Labs  Lab 07/26/22 0628  INR 1.0   Cardiac Enzymes: No results for input(s): "CKTOTAL", "CKMB", "CKMBINDEX", "TROPONINI" in the last 168 hours. BNP (last 3 results) No results for input(s): "PROBNP" in the last 8760 hours. HbA1C: No results for input(s): "HGBA1C" in the last 72 hours. CBG: Recent Labs  Lab 07/26/22 0803 07/26/22 1016  GLUCAP 119* 49*   Lipid Profile: No results for input(s): "CHOL", "HDL", "LDLCALC", "TRIG", "CHOLHDL", "LDLDIRECT" in the last 72 hours. Thyroid Function Tests: No results for input(s): "TSH", "T4TOTAL", "FREET4", "T3FREE", "THYROIDAB" in the last 72 hours. Anemia Panel: No results for input(s): "VITAMINB12", "FOLATE", "FERRITIN", "TIBC", "IRON", "RETICCTPCT" in the last 72 hours. Urine analysis:    Component Value Date/Time   COLORURINE YELLOW (A) 07/26/2022 0628   APPEARANCEUR HAZY (A) 07/26/2022 0628   LABSPEC 1.014 07/26/2022 0628   PHURINE 6.0 07/26/2022 0628   GLUCOSEU NEGATIVE 07/26/2022 0628   HGBUR NEGATIVE 07/26/2022 0628   BILIRUBINUR NEGATIVE 07/26/2022 0628   KETONESUR NEGATIVE 07/26/2022 0628   PROTEINUR NEGATIVE 07/26/2022 0628   NITRITE NEGATIVE 07/26/2022 0628   LEUKOCYTESUR MODERATE (A) 07/26/2022 0628   Sepsis Labs: @LABRCNTIP (procalcitonin:4,lacticidven:4) )No results found for this or any previous visit (from the past 240 hour(s)).   Radiological Exams on Admission: CT HEAD WO CONTRAST ( )  Result Date: 07/26/2022 CLINICAL DATA:  Mental status change, unknown cause EXAM: CT HEAD WITHOUT CONTRAST TECHNIQUE: Contiguous axial images were obtained from the base of the skull  through the vertex without intravenous contrast. RADIATION DOSE REDUCTION: This exam was performed according to the departmental dose-optimization program which includes automated exposure control, adjustment of the mA and/or kV according to patient size and/or use of iterative reconstruction technique. COMPARISON:  None Available. FINDINGS: Brain: No evidence of acute infarction, hemorrhage, hydrocephalus, extra-axial collection or mass lesion/mass effect.  Vascular: No hyperdense vessel or unexpected calcification. Skull: Normal. Negative for fracture or focal lesion. Sinuses/Orbits: No acute finding. Other: None. IMPRESSION: No acute intracranial pathology. Electronically Signed   By: Larose Hires D.O.   On: 07/26/2022 08:56   DG Chest Port 1 View  Result Date: 07/26/2022 CLINICAL DATA:  62 year old female with history of altered mental status. EXAM: PORTABLE CHEST 1 VIEW COMPARISON:  Chest x-ray 04/27/2006. FINDINGS: Lung volumes are normal. No consolidative airspace disease. No pleural effusions. No pneumothorax. No pulmonary nodule or mass noted. Pulmonary vasculature and the cardiomediastinal silhouette are within normal limits. IMPRESSION: No radiographic evidence of acute cardiopulmonary disease. Electronically Signed   By: Trudie Reed M.D.   On: 07/26/2022 06:37      Assessment/Plan Principal Problem:   Severe sepsis (HCC) Active Problems:   UTI (urinary tract infection)   Acute metabolic encephalopathy   HTN (hypertension)   Hypoglycemia   Hypothermia   Type II diabetes mellitus with renal manifestations (HCC)   Acute renal failure superimposed on stage 2 chronic kidney disease (HCC)   Asthma without status asthmaticus   Hyponatremia   Acquired hypothyroidism   ADD (attention deficit disorder)   Depression with anxiety   Obesity (BMI 30-39.9)   Hypophosphatemia   Assessment and Plan:  Severe sepsis due to UTI: pt meets criteria for severe sepsis with WBC 13.4, RR 21,  hypothermia with body temperature 97.2, lactic acid of 4.0.  Patient also has altered mental status and worsening renal function.  Currently hemodynamically stable.  -Admitted to PCU as inpatient -IV Rocephin -Follow-up blood culture and urine culture -Trend lactic acid level -Check procalcitonin level -IV fluid: 2 L of LR and 500 ml of NS, then 100 cc/h of normal saline --> will give more NS bolus if lactic acid level is not normalized  Hypertension: -IV hydralazine as needed -Hold home blood pressure medications (Ziac and Diovan) since patient is at high risk of developing hypotension due to sepsis.  Acute metabolic encephalopathy: Likely multifactorial etiology, including severe sepsis, UTI, worsening renal function, electrolytes disturbance, hyponatremia, hypoglycemia. -Frequent neurocheck -Fall precaution -Follow-up CT of head --> negative  Hypoglycemia: CBG 52 -Hold home Amaryl and metformin -check CBG q1h -D50 prn  Hypothermia: -Bair Hugger  Type II diabetes mellitus with renal manifestations (HCC): Recent A1c 6.4, well-controlled.  Patient is taking metformin, Amaryl at home. -hold home medications due to hypoglycemia  Acute renal failure superimposed on stage 2 chronic kidney disease [facility]): Recent baseline creatinine 0.8 on 05/02/2022.  Her creatinine is 1.24, BUN 22, GFR 49.  Likely multifactorial etiology, including recent Bactrim use, UTI and continuation of ARB and HCTZ -hold bisoprolol-HCTZ and Diovan -IV fluid as above  Asthma without status asthmaticus -prn albuterol and Mucinex  Hyponatremia: Sodium 127. - Will check urine sodium, urine osmolality, serum osmolality. - Fluid restriction - IVF:  as above - Sodium chloride tablet 1 g twice daily - f/u by BMP q8h - avoid over correction too fast due to risk of central pontine myelinolysis  Acquired hypothyroidism -Synthroid  ADD (attention deficit disorder) and Depression with anxiety -Continue home  medications  Obesity (BMI 30-39.9): Body weight 97.1 kg, BMI 37.91 -Encourage losing weight -Exercise and healthy diet  Hypophosphatemia: Phosphorus 2.0 -give 20 mmol of potassium phosphate     DVT ppx: SQ Lovenox  Code Status: Full code  Family Communication:   Yes, patient's husband and son   at bed side.    Disposition Plan:  Anticipate discharge back to  previous environment  Consults called:  none  Admission status and Level of care: Progressive:    as inpt     Dispo: The patient is from: Home              Anticipated d/c is to: Home              Anticipated d/c date is: 2 days              Patient currently is not medically stable to d/c.    Severity of Illness:  The appropriate patient status for this patient is INPATIENT. Inpatient status is judged to be reasonable and necessary in order to provide the required intensity of service to ensure the patient's safety. The patient's presenting symptoms, physical exam findings, and initial radiographic and laboratory data in the context of their chronic comorbidities is felt to place them at high risk for further clinical deterioration. Furthermore, it is not anticipated that the patient will be medically stable for discharge from the hospital within 2 midnights of admission.   * I certify that at the point of admission it is my clinical judgment that the patient will require inpatient hospital care spanning beyond 2 midnights from the point of admission due to high intensity of service, high risk for further deterioration and high frequency of surveillance required.*       Date of Service 07/26/2022    Lorretta Harp [facility]   If 7PM-7AM, please contact night-coverage [email or web address] 07/26/2022, 11:59 AM

## 2022-07-26 NOTE — ED Triage Notes (Signed)
EMS called out to pt house for "not acting right." Recently completed a course of prednisone. Pt husband called EMS when he found her sitting on a couch down stairs. Pt is alert and oriented on arrival. Pt c/o cramps in leg. Pt reports recent treatment for gout and UTI.   148/64 18 RAC CBG 84

## 2022-07-27 DIAGNOSIS — A419 Sepsis, unspecified organism: Secondary | ICD-10-CM | POA: Diagnosis not present

## 2022-07-27 DIAGNOSIS — R6520 Severe sepsis without septic shock: Secondary | ICD-10-CM | POA: Diagnosis not present

## 2022-07-27 LAB — BLOOD CULTURE ID PANEL (REFLEXED) - BCID2

## 2022-07-27 LAB — CBC
HCT: 35.5 % — ABNORMAL LOW (ref 36.0–46.0)
Hemoglobin: 11.8 g/dL — ABNORMAL LOW (ref 12.0–15.0)
MCH: 29.6 pg (ref 26.0–34.0)
MCHC: 33.2 g/dL (ref 30.0–36.0)
MCV: 89 fL (ref 80.0–100.0)
Platelets: 353 10*3/uL (ref 150–400)
RBC: 3.99 MIL/uL (ref 3.87–5.11)
RDW: 13.3 % (ref 11.5–15.5)
WBC: 8.9 10*3/uL (ref 4.0–10.5)
nRBC: 0 % (ref 0.0–0.2)

## 2022-07-27 LAB — BASIC METABOLIC PANEL
Anion gap: 7 (ref 5–15)
BUN: 11 mg/dL (ref 8–23)
CO2: 26 mmol/L (ref 22–32)
Calcium: 9.1 mg/dL (ref 8.9–10.3)
Chloride: 101 mmol/L (ref 98–111)
Creatinine, Ser: 1.16 mg/dL — ABNORMAL HIGH (ref 0.44–1.00)
GFR, Estimated: 53 mL/min — ABNORMAL LOW (ref >60)
Glucose, Bld: 162 mg/dL — ABNORMAL HIGH (ref 70–99)
Potassium: 4.6 mmol/L (ref 3.5–5.1)
Sodium: 134 mmol/L — ABNORMAL LOW (ref 135–145)

## 2022-07-27 LAB — MISC LABCORP TEST (SEND OUT): Labcorp test code: 83935

## 2022-07-27 LAB — CULTURE, BLOOD (ROUTINE X 2)

## 2022-07-27 LAB — URINE CULTURE: Culture: NO GROWTH

## 2022-07-27 LAB — CBG MONITORING, ED
Glucose-Capillary: 173 mg/dL — ABNORMAL HIGH (ref 70–99)
Glucose-Capillary: 145 mg/dL — ABNORMAL HIGH (ref 70–99)
Glucose-Capillary: 127 mg/dL — ABNORMAL HIGH (ref 70–99)

## 2022-07-27 LAB — PHOSPHORUS: Phosphorus: 2.8 mg/dL (ref 2.5–4.6)

## 2022-07-27 MED ORDER — CIPROFLOXACIN HCL 500 MG PO TABS: 500.0000 mg | ORAL_TABLET | Freq: Two times a day (BID) | ORAL | 0 refills | Status: AC

## 2022-07-27 NOTE — Progress Notes (Signed)
PHARMACY - PHYSICIAN COMMUNICATION CRITICAL VALUE ALERT - BLOOD CULTURE IDENTIFICATION (BCID)  Victoria Patterson is an 62 y.o. female who presented to [facility] on 07/26/2022 with a chief complaint of altered mental status  Assessment:  Blood culture with GPC 1/4 bottles, BCID detects MSSE (not S aureus)  Name of physician (or Provider) Contacted: Dr Georgeann Oppenheim  Current antibiotics: ceftriaxone to po cipro x 1 dose at home  Changes to prescribed antibiotics recommended:  Patient is on recommended antibiotics - No changes needed - patient discharged - blood cultures appears to be contaminant. No changes to therapy.    Results for orders placed or performed during the hospital encounter of 07/26/22  Blood Culture ID Panel (Reflexed) (Collected: 07/26/2022  6:28 AM)  Result Value Ref Range   Enterococcus faecalis NOT DETECTED NOT DETECTED   Enterococcus Faecium NOT DETECTED NOT DETECTED   Listeria monocytogenes NOT DETECTED NOT DETECTED   Staphylococcus species DETECTED (A) NOT DETECTED   Staphylococcus aureus (BCID) NOT DETECTED NOT DETECTED   Staphylococcus epidermidis DETECTED (A) NOT DETECTED   Staphylococcus lugdunensis NOT DETECTED NOT DETECTED   Streptococcus species NOT DETECTED NOT DETECTED   Streptococcus agalactiae NOT DETECTED NOT DETECTED   Streptococcus pneumoniae NOT DETECTED NOT DETECTED   Streptococcus pyogenes NOT DETECTED NOT DETECTED   A.calcoaceticus-baumannii NOT DETECTED NOT DETECTED   Bacteroides fragilis NOT DETECTED NOT DETECTED   Enterobacterales NOT DETECTED NOT DETECTED   Enterobacter cloacae complex NOT DETECTED NOT DETECTED   Escherichia coli NOT DETECTED NOT DETECTED   Klebsiella aerogenes NOT DETECTED NOT DETECTED   Klebsiella oxytoca NOT DETECTED NOT DETECTED   Klebsiella pneumoniae NOT DETECTED NOT DETECTED   Proteus species NOT DETECTED NOT DETECTED   Salmonella species NOT DETECTED NOT DETECTED   Serratia marcescens NOT DETECTED NOT DETECTED    Haemophilus influenzae NOT DETECTED NOT DETECTED   Neisseria meningitidis NOT DETECTED NOT DETECTED   Pseudomonas aeruginosa NOT DETECTED NOT DETECTED   Stenotrophomonas maltophilia NOT DETECTED NOT DETECTED   Candida albicans NOT DETECTED NOT DETECTED   Candida auris NOT DETECTED NOT DETECTED   Candida glabrata NOT DETECTED NOT DETECTED   Candida krusei NOT DETECTED NOT DETECTED   Candida parapsilosis NOT DETECTED NOT DETECTED   Candida tropicalis NOT DETECTED NOT DETECTED   Cryptococcus neoformans/gattii NOT DETECTED NOT DETECTED   Methicillin resistance mecA/C NOT DETECTED NOT DETECTED    Juliette Alcide, PharmD, BCPS, BCIDP Work Cell: [phone] 07/27/2022 12:30 PM

## 2022-07-27 NOTE — ED Notes (Signed)
To promote sleep held q 1 hour BG checks.  Patients BG have been stable.

## 2022-07-27 NOTE — Discharge Summary (Signed)
Physician Discharge Summary  Victoria Patterson:[id number] DOB: 1960-05-05 DOA: 07/26/2022  PCP: Marguarite Arbour, MD  Admit date: 07/26/2022 Discharge date: 07/27/2022  Admitted From: Home Disposition:  Home  Recommendations for Outpatient Follow-up:  Follow up with PCP in 1-2 weeks   Home Health:No Equipment/Devices:None   Discharge Condition:Stable  CODE STATUS:FULL  Diet recommendation: Reg  Brief/Interim Summary: 62 y.o. female with medical history significant of gout, hypertension, hyperlipidemia, diabetes mellitus, asthma, hypothyroidism, depression with anxiety, OCD, ADHD, CKD-II, who presents with altered mental status.   Per her husband and son at the bedside, patient was recently treated for right great toe gout with prednisone and UTI with Bactrim.  Patient finished 7-day course of Bactrim treatment.  Her right great toe pain has almost resolved after taking prednisone. Since yesterday, patient has been confused, not acting right. Pt was found her sitting on a couch downstairs, with confusion and bizarre behavior. When I saw pt in ED, she is mildly confused, but still orientated x 3.  Patient moves all extremities normally.  No unilateral numbness or tinglings.  No facial droop or slurred speech.  Patient feels dizzy when standing up.   Patient denies chest pain, cough, shortness of breath.  No fever or chills.  Denies nausea, vomiting, diarrhea or abdominal pain.  Patient has been constipated for about 1 week.  Patient states that she had increased urinary frequency before taking Bactrim, which is better now.  Denies dysuria, burning on urination.  No hematuria. She said that she has been having cramps in her legs but that is not unusual.    Patient was placed on empiric antibiotics and intravenous fluids.  Evaluated the next morning.  Feeling much better.  Unable to totally exclude urinary tract infection.  Will treat empirically for 3-day course.  Patient received 2 doses of  intravenous Rocephin in house.  Will discharge with 1 additional day of p.o. ciprofloxacin 500 mg twice daily.  On review of medical record patient is on a number of different agents that could contribute to altered mentation.  This in the setting of prednisone course as well as Bactrim could have contributed to altered mentation.  CT head negative.  Also patient stated she had not been eating much in the setting of a gout flare with concerned that it may worsen underlying gout.  Patient given advice on foods to eat versus avoid.  She expressed understanding.  Husband at bedside.  All questions answered.  Patient stable for discharge home.    Discharge Diagnoses:  Principal Problem:   Severe sepsis (HCC) Active Problems:   UTI (urinary tract infection)   Acute metabolic encephalopathy   HTN (hypertension)   Hypoglycemia   Hypothermia   Type II diabetes mellitus with renal manifestations (HCC)   Acute renal failure superimposed on stage 2 chronic kidney disease (HCC)   Asthma without status asthmaticus   Hyponatremia   Acquired hypothyroidism   ADD (attention deficit disorder)   Depression with anxiety   Obesity (BMI 30-39.9)   Hypophosphatemia  Acute metabolic encephalopathy Severe sepsis possibly due to UTI Patient met criteria with severe sepsis with leukocytosis, elevated respiratory rate, hypothermia, elevated lactic acid.  All of these have subsequently cleared.  Unable to totally exclude infection.  Will treat for uncomplicated cystitis with 3-day course.  Patient mental status is back to baseline at time of discharge.  CT head negative.  No other changes made at home medication regimen.  Stable for discharge home.  Discharge Instructions  Discharge Instructions     Diet - low sodium heart healthy   Complete by: As directed    Increase activity slowly   Complete by: As directed       Allergies as of 07/27/2022       Reactions   Penicillins Hives   Has patient had a  PCN reaction causing immediate rash, facial/tongue/throat swelling, SOB or lightheadedness with hypotension: No Has patient had a PCN reaction causing severe rash involving mucus membranes or skin necrosis: Yes Has patient had a PCN reaction that required hospitalization: No Has patient had a PCN reaction occurring within the last 10 years: No If all of the above answers are "NO", then may proceed with Cephalosporin use.   Stadol [butorphanol] Anxiety        Medication List     TAKE these medications    allopurinol 100 MG tablet Commonly known as: ZYLOPRIM Take 100 mg by mouth daily.   bisoprolol-hydrochlorothiazide 5-6.25 MG tablet Commonly known as: ZIAC Take 1 tablet by mouth daily.   cetirizine 10 MG chewable tablet Commonly known as: ZYRTEC Chew 10 mg by mouth daily.   ciprofloxacin 500 MG tablet Commonly known as: Cipro Take 1 tablet (500 mg total) by mouth 2 (two) times daily for 1 day. Start taking on: Jul 28, 2022   colchicine 0.6 MG tablet Take 0.6 mg by mouth daily.   DULoxetine 30 MG capsule Commonly known as: CYMBALTA Take 1 capsule (30 mg total) by mouth daily.   ezetimibe 10 MG tablet Commonly known as: ZETIA Take 10 mg by mouth daily.   glimepiride 4 MG tablet Commonly known as: AMARYL Take 4 mg by mouth daily with breakfast.   hydrOXYzine 25 MG tablet Commonly known as: ATARAX Take 1 tablet (25 mg total) by mouth daily as needed for anxiety.   lamoTRIgine 200 MG tablet Commonly known as: LAMICTAL Take 1 tablet (200 mg total) by mouth daily.   levothyroxine 75 MCG tablet Commonly known as: SYNTHROID Take 75 mcg by mouth daily before breakfast.   meclizine 25 MG tablet Commonly known as: ANTIVERT Take 25 mg by mouth 3 (three) times daily as needed for dizziness.   metFORMIN 500 MG tablet Commonly known as: GLUCOPHAGE Take 500 mg by mouth daily with breakfast.   ROLAIDS PO Take 1 tablet by mouth daily as needed (acid reflux).    valsartan 80 MG tablet Commonly known as: DIOVAN Take 80 mg by mouth daily.        Allergies  Allergen Reactions   Penicillins Hives    Has patient had a PCN reaction causing immediate rash, facial/tongue/throat swelling, SOB or lightheadedness with hypotension: No Has patient had a PCN reaction causing severe rash involving mucus membranes or skin necrosis: Yes Has patient had a PCN reaction that required hospitalization: No Has patient had a PCN reaction occurring within the last 10 years: No If all of the above answers are "NO", then may proceed with Cephalosporin use.    Stadol [Butorphanol] Anxiety    Consultations: None   Procedures/Studies: CT HEAD WO CONTRAST ( )  Result Date: 07/26/2022 CLINICAL DATA:  Mental status change, unknown cause EXAM: CT HEAD WITHOUT CONTRAST TECHNIQUE: Contiguous axial images were obtained from the base of the skull through the vertex without intravenous contrast. RADIATION DOSE REDUCTION: This exam was performed according to the departmental dose-optimization program which includes automated exposure control, adjustment of the mA and/or kV according to patient size and/or use of iterative reconstruction technique. COMPARISON:  None Available. FINDINGS: Brain: No evidence of acute infarction, hemorrhage, hydrocephalus, extra-axial collection or mass lesion/mass effect. Vascular: No hyperdense vessel or unexpected calcification. Skull: Normal. Negative for fracture or focal lesion. Sinuses/Orbits: No acute finding. Other: None. IMPRESSION: No acute intracranial pathology. Electronically Signed   By: Larose Hires D.O.   On: 07/26/2022 08:56   DG Chest Port 1 View  Result Date: 07/26/2022 CLINICAL DATA:  62 year old female with history of altered mental status. EXAM: PORTABLE CHEST 1 VIEW COMPARISON:  Chest x-ray 04/27/2006. FINDINGS: Lung volumes are normal. No consolidative airspace disease. No pleural effusions. No pneumothorax. No pulmonary  nodule or mass noted. Pulmonary vasculature and the cardiomediastinal silhouette are within normal limits. IMPRESSION: No radiographic evidence of acute cardiopulmonary disease. Electronically Signed   By: Trudie Reed M.D.   On: 07/26/2022 06:37      Subjective: Seen and examined on day of discharge.  Stable no distress.  Husband at bedside.  Stable for discharge home.  Discharge Exam: Vitals:   07/27/22 0800 07/27/22 0815  BP: 123/77   Pulse: (!) 58 60  Resp: 20 16  Temp:    SpO2: 100% 100%   Vitals:   07/27/22 0603 07/27/22 0744 07/27/22 0800 07/27/22 0815  BP:  138/81 123/77   Pulse: (!) 55 64 (!) 58 60  Resp: 17 18 20 16   Temp:      TempSrc:      SpO2: 98% 100% 100% 100%  Weight:      Height:        General: Pt is alert, awake, not in acute distress Cardiovascular: RRR, S1/S2 +, no rubs, no gallops Respiratory: CTA bilaterally, no wheezing, no rhonchi Abdominal: Soft, NT, ND, bowel sounds + Extremities: no edema, no cyanosis    The results of significant diagnostics from this hospitalization (including imaging, microbiology, ancillary and laboratory) are listed below for reference.     Microbiology: Recent Results (from the past 240 hour(s))  Culture, blood (Routine x 2)     Status: None (Preliminary result)   Collection Time: 07/26/22  6:28 AM   Specimen: BLOOD  Result Value Ref Range Status   Specimen Description BLOOD  LEFT FOREARM  Final   Special Requests   Final    BOTTLES DRAWN AEROBIC AND ANAEROBIC Blood Culture adequate volume   Culture   Final    NO GROWTH 1 DAY Performed at [facility], [address], [place], [place] [address]    Report Status PENDING  Incomplete  Culture, blood (Routine x 2)     Status: None (Preliminary result)   Collection Time: 07/26/22  6:28 AM   Specimen: BLOOD  Result Value Ref Range Status   Specimen Description BLOOD  LEFT AC  Final   Special Requests   Final    BOTTLES DRAWN AEROBIC AND ANAEROBIC  Blood Culture results may not be optimal due to an inadequate volume of blood received in culture bottles   Culture  Setup Time   Final    GRAM POSITIVE COCCI ANAEROBIC BOTTLE ONLY Organism ID to follow CRITICAL RESULT CALLED TO, READ BACK BY AND VERIFIED WITH: Performed at [facility], [address], [place], [place] [address]    Culture [facility] POSITIVE COCCI  Final   Report Status PENDING  Incomplete  Urine Culture     Status: None   Collection Time: 07/26/22  6:28 AM   Specimen: In/Out Cath Urine  Result Value Ref Range Status   Specimen Description  Final    IN/OUT CATH URINE Performed at [facility], [address], [place], [place] [address]    Special Requests   Final    NONE Performed at [facility], [address] [place], [place] [address]    Culture   Final    NO GROWTH Performed at [facility], [address] [place], [place] [address]    Report Status 07/27/2022 FINAL  Final  Blood Culture ID Panel (Reflexed)     Status: Abnormal   Collection Time: 07/26/22  6:28 AM  Result Value Ref Range Status   Enterococcus faecalis NOT DETECTED NOT DETECTED Final   Enterococcus Faecium NOT DETECTED NOT DETECTED Final   Listeria monocytogenes NOT DETECTED NOT DETECTED Final   Staphylococcus species DETECTED (A) NOT DETECTED Final    Comment: CRITICAL RESULT CALLED TO, READ BACK BY AND VERIFIED WITH: DEVAN MITCHELL 07/27/22 @ 1158 BY SB    Staphylococcus aureus (BCID) NOT DETECTED NOT DETECTED Final   Staphylococcus epidermidis DETECTED (A) NOT DETECTED Final    Comment: CRITICAL RESULT CALLED TO, READ BACK BY AND VERIFIED WITH: DEVAN MITCHELL 07/27/22 @ 1158 BY SB    Staphylococcus lugdunensis NOT DETECTED NOT DETECTED Final   Streptococcus species NOT DETECTED NOT DETECTED Final   Streptococcus agalactiae NOT DETECTED NOT DETECTED Final   Streptococcus pneumoniae NOT DETECTED NOT DETECTED Final   Streptococcus pyogenes NOT DETECTED  NOT DETECTED Final   A.calcoaceticus-baumannii NOT DETECTED NOT DETECTED Final   Bacteroides fragilis NOT DETECTED NOT DETECTED Final   Enterobacterales NOT DETECTED NOT DETECTED Final   Enterobacter cloacae complex NOT DETECTED NOT DETECTED Final   Escherichia coli NOT DETECTED NOT DETECTED Final   Klebsiella aerogenes NOT DETECTED NOT DETECTED Final   Klebsiella oxytoca NOT DETECTED NOT DETECTED Final   Klebsiella pneumoniae NOT DETECTED NOT DETECTED Final   Proteus species NOT DETECTED NOT DETECTED Final   Salmonella species NOT DETECTED NOT DETECTED Final   Serratia marcescens NOT DETECTED NOT DETECTED Final   Haemophilus influenzae NOT DETECTED NOT DETECTED Final   Neisseria meningitidis NOT DETECTED NOT DETECTED Final   Pseudomonas aeruginosa NOT DETECTED NOT DETECTED Final   Stenotrophomonas maltophilia NOT DETECTED NOT DETECTED Final   Candida albicans NOT DETECTED NOT DETECTED Final   Candida auris NOT DETECTED NOT DETECTED Final   Candida glabrata NOT DETECTED NOT DETECTED Final   Candida krusei NOT DETECTED NOT DETECTED Final   Candida parapsilosis NOT DETECTED NOT DETECTED Final   Candida tropicalis NOT DETECTED NOT DETECTED Final   Cryptococcus neoformans/gattii NOT DETECTED NOT DETECTED Final   Methicillin resistance mecA/C NOT DETECTED NOT DETECTED Final    Comment: Performed at [facility], [address] [place], [place] [address]     Labs: BNP (last 3 results) No results for input(s): "BNP" in the last 8760 hours. Basic Metabolic Panel: Recent Labs  Lab 07/26/22 0628 07/26/22 0759 07/26/22 1632 07/27/22 0042  NA 127* 124* 131* 134*  K 4.8 3.9 4.9 4.6  CL 89* 90* 97* 101  CO2 26 25 26 26   GLUCOSE 52* 125* 95 162*  BUN 22 21 14 11   CREATININE 1.24* 1.19* 1.15* 1.16*  CALCIUM 9.8 9.0 9.1 9.1  MG 2.3  --   --   --   PHOS  --  2.0*  --  2.8   Liver Function Tests: Recent Labs  Lab 07/26/22 0628  AST 29  ALT 21  ALKPHOS 60  BILITOT 0.8  PROT 7.4  ALBUMIN 4.4   No results for input(s): "LIPASE", "AMYLASE" in the last 168 hours. No results for input(s): "AMMONIA" in the last 168 hours. CBC: Recent Labs  Lab 07/26/22 0628 07/27/22 0042  WBC 13.4* 8.9  NEUTROABS 10.3*  --   HGB 13.3 11.8*  HCT 38.3 35.5*  MCV 87.8 89.0  PLT 439* 353   Cardiac Enzymes: No results for input(s): "CKTOTAL", "CKMB", "CKMBINDEX", "TROPONINI" in the last 168 hours. BNP: Invalid input(s): "POCBNP" CBG: Recent Labs  Lab 07/26/22 1853 07/26/22 2336 07/27/22 0228 07/27/22 0531 07/27/22 0748  GLUCAP 145* 112* 173* 145* 127*   D-Dimer No results for input(s): "DDIMER" in the last 72 hours. Hgb A1c No results for input(s): "HGBA1C" in the last 72 hours. Lipid Profile No results for input(s): "CHOL", "HDL", "LDLCALC", "TRIG", "CHOLHDL", "LDLDIRECT" in the last 72 hours. Thyroid function studies No results for input(s): "TSH", "T4TOTAL", "T3FREE", "THYROIDAB" in the last 72 hours.  Invalid input(s): "FREET3" Anemia work up No results for input(s): "VITAMINB12", "FOLATE", "FERRITIN", "TIBC", "IRON", "RETICCTPCT" in the last 72 hours. Urinalysis    Component Value Date/Time   COLORURINE YELLOW (A) 07/26/2022 0628   APPEARANCEUR HAZY (A) 07/26/2022 0628   LABSPEC 1.014 07/26/2022 0628   PHURINE 6.0 07/26/2022 0628   GLUCOSEU NEGATIVE 07/26/2022 0628   HGBUR NEGATIVE 07/26/2022 0628   BILIRUBINUR NEGATIVE 07/26/2022 0628   KETONESUR NEGATIVE 07/26/2022 0628   PROTEINUR NEGATIVE 07/26/2022 0628   NITRITE NEGATIVE 07/26/2022 0628   LEUKOCYTESUR MODERATE (A) 07/26/2022 0628   Sepsis Labs Recent Labs  Lab 07/26/22 0628 07/27/22 0042  WBC 13.4* 8.9   Microbiology Recent Results (from the past 240 hour(s))  Culture, blood (Routine x 2)     Status: None (Preliminary result)   Collection Time: 07/26/22  6:28 AM   Specimen: BLOOD  Result Value Ref Range Status   Specimen Description BLOOD  LEFT FOREARM  Final   Special  Requests   Final    BOTTLES DRAWN AEROBIC AND ANAEROBIC Blood Culture adequate volume   Culture   Final    NO GROWTH 1 DAY Performed at [facility], [address] [place], [place] [address]    Report Status PENDING  Incomplete  Culture, blood (Routine x 2)     Status: None (Preliminary result)   Collection Time: 07/26/22  6:28 AM   Specimen: BLOOD  Result Value Ref Range Status   Specimen Description BLOOD  LEFT AC  Final   Special Requests   Final    BOTTLES DRAWN AEROBIC AND ANAEROBIC Blood Culture results may not be optimal due to an inadequate volume of blood received in culture bottles   Culture  Setup Time   Final    GRAM POSITIVE COCCI ANAEROBIC BOTTLE ONLY Organism ID to follow CRITICAL RESULT CALLED TO, READ BACK BY AND VERIFIED WITH: Performed at [facility], [address], [place], [place] [address]    Culture GRAM POSITIVE COCCI  Final   Report Status PENDING  Incomplete  Urine Culture     Status: None   Collection Time: 07/26/22  6:28 AM   Specimen: In/Out Cath Urine  Result Value Ref Range Status   Specimen Description   Final    IN/OUT CATH URINE Performed at [facility], [address], [place], [place] [address]    Special Requests   Final    NONE Performed at [facility], [address] [place], [place] [address]    Culture  Final    NO GROWTH Performed at [facility], [address] [place], [place] [address]    Report Status 07/27/2022 FINAL  Final  Blood Culture ID Panel (Reflexed)     Status: Abnormal   Collection Time: 07/26/22  6:28 AM  Result Value Ref Range Status   Enterococcus faecalis NOT DETECTED NOT DETECTED Final   Enterococcus Faecium NOT DETECTED NOT DETECTED Final   Listeria monocytogenes NOT DETECTED NOT DETECTED Final   Staphylococcus species DETECTED (A) NOT DETECTED Final    Comment: CRITICAL RESULT CALLED TO, READ BACK BY AND VERIFIED WITH: DEVAN MITCHELL 07/27/22 @ 1158 BY  SB    Staphylococcus aureus (BCID) NOT DETECTED NOT DETECTED Final   Staphylococcus epidermidis DETECTED (A) NOT DETECTED Final    Comment: CRITICAL RESULT CALLED TO, READ BACK BY AND VERIFIED WITH: DEVAN MITCHELL 07/27/22 @ 1158 BY SB    Staphylococcus lugdunensis NOT DETECTED NOT DETECTED Final   Streptococcus species NOT DETECTED NOT DETECTED Final   Streptococcus agalactiae NOT DETECTED NOT DETECTED Final   Streptococcus pneumoniae NOT DETECTED NOT DETECTED Final   Streptococcus pyogenes NOT DETECTED NOT DETECTED Final   A.calcoaceticus-baumannii NOT DETECTED NOT DETECTED Final   Bacteroides fragilis NOT DETECTED NOT DETECTED Final   Enterobacterales NOT DETECTED NOT DETECTED Final   Enterobacter cloacae complex NOT DETECTED NOT DETECTED Final   Escherichia coli NOT DETECTED NOT DETECTED Final   Klebsiella aerogenes NOT DETECTED NOT DETECTED Final   Klebsiella oxytoca NOT DETECTED NOT DETECTED Final   Klebsiella pneumoniae NOT DETECTED NOT DETECTED Final   Proteus species NOT DETECTED NOT DETECTED Final   Salmonella species NOT DETECTED NOT DETECTED Final   Serratia marcescens NOT DETECTED NOT DETECTED Final   Haemophilus influenzae NOT DETECTED NOT DETECTED Final   Neisseria meningitidis NOT DETECTED NOT DETECTED Final   Pseudomonas aeruginosa NOT DETECTED NOT DETECTED Final   Stenotrophomonas maltophilia NOT DETECTED NOT DETECTED Final   Candida albicans NOT DETECTED NOT DETECTED Final   Candida auris NOT DETECTED NOT DETECTED Final   Candida glabrata NOT DETECTED NOT DETECTED Final   Candida krusei NOT DETECTED NOT DETECTED Final   Candida parapsilosis NOT DETECTED NOT DETECTED Final   Candida tropicalis NOT DETECTED NOT DETECTED Final   Cryptococcus neoformans/gattii NOT DETECTED NOT DETECTED Final   Methicillin resistance mecA/C NOT DETECTED NOT DETECTED Final    Comment: Performed at [facility], [address] [place], [place] [address]     Time  coordinating discharge: Over 30 minutes  SIGNED:   Tresa Moore, MD  [facility] 07/27/2022, 1:30 PM Pager   If 7PM-7AM, please contact night-coverage

## 2022-07-28 LAB — CULTURE, BLOOD (ROUTINE X 2): Special Requests: ADEQUATE

## 2022-07-29 LAB — CULTURE, BLOOD (ROUTINE X 2)

## 2022-07-30 LAB — CULTURE, BLOOD (ROUTINE X 2): Culture: NO GROWTH

## 2022-07-31 ENCOUNTER — Encounter: Payer: Self-pay | Admitting: Internal Medicine

## 2022-07-31 LAB — CULTURE, BLOOD (ROUTINE X 2)

## 2022-08-01 DIAGNOSIS — F422 Mixed obsessional thoughts and acts: Secondary | ICD-10-CM | POA: Diagnosis not present

## 2022-08-02 DIAGNOSIS — E782 Mixed hyperlipidemia: Secondary | ICD-10-CM | POA: Diagnosis not present

## 2022-08-02 DIAGNOSIS — Z79899 Other long term (current) drug therapy: Secondary | ICD-10-CM | POA: Diagnosis not present

## 2022-08-02 DIAGNOSIS — R899 Unspecified abnormal finding in specimens from other organs, systems and tissues: Secondary | ICD-10-CM | POA: Diagnosis not present

## 2022-08-02 DIAGNOSIS — E118 Type 2 diabetes mellitus with unspecified complications: Secondary | ICD-10-CM | POA: Diagnosis not present

## 2022-08-02 DIAGNOSIS — I10 Essential (primary) hypertension: Secondary | ICD-10-CM | POA: Diagnosis not present

## 2022-08-02 DIAGNOSIS — R7989 Other specified abnormal findings of blood chemistry: Secondary | ICD-10-CM | POA: Diagnosis not present

## 2022-08-04 ENCOUNTER — Other Ambulatory Visit: Payer: 59

## 2022-08-09 DIAGNOSIS — I10 Essential (primary) hypertension: Secondary | ICD-10-CM | POA: Diagnosis not present

## 2022-08-09 DIAGNOSIS — T466X5A Adverse effect of antihyperlipidemic and antiarteriosclerotic drugs, initial encounter: Secondary | ICD-10-CM | POA: Diagnosis not present

## 2022-08-09 DIAGNOSIS — G720 Drug-induced myopathy: Secondary | ICD-10-CM | POA: Diagnosis not present

## 2022-08-09 DIAGNOSIS — E039 Hypothyroidism, unspecified: Secondary | ICD-10-CM | POA: Diagnosis not present

## 2022-08-09 DIAGNOSIS — M25512 Pain in left shoulder: Secondary | ICD-10-CM | POA: Diagnosis not present

## 2022-08-09 DIAGNOSIS — E782 Mixed hyperlipidemia: Secondary | ICD-10-CM | POA: Diagnosis not present

## 2022-08-09 DIAGNOSIS — E118 Type 2 diabetes mellitus with unspecified complications: Secondary | ICD-10-CM | POA: Diagnosis not present

## 2022-08-09 DIAGNOSIS — N390 Urinary tract infection, site not specified: Secondary | ICD-10-CM | POA: Diagnosis not present

## 2022-08-10 DIAGNOSIS — N390 Urinary tract infection, site not specified: Secondary | ICD-10-CM | POA: Diagnosis not present

## 2022-08-15 DIAGNOSIS — F422 Mixed obsessional thoughts and acts: Secondary | ICD-10-CM | POA: Diagnosis not present

## 2022-08-24 NOTE — Progress Notes (Deleted)
BH MD/PA/NP OP Progress Note  08/24/2022 8:22 AM Victoria Patterson  MRN:  [id number]  Chief Complaint: No chief complaint on file.  HPI:  According to the chart review, the following events have occurred since the last visit: The patient was seen admitted for sepsis due to UTI.    Visit Diagnosis: No diagnosis found.  Past Psychiatric History: Please see initial evaluation for full details. I have reviewed the history. No updates at this time.     Past Medical History:  Past Medical History:  Diagnosis Date   ADHD (attention deficit hyperactivity disorder)    Anxiety    Arthritis    Asthma    controlled for many years   Depression    Diabetes mellitus, type II (HCC)    Fatty liver    Hypertension    Hypothyroidism    Obsessive-compulsive disorder     Past Surgical History:  Procedure Laterality Date   BREAST CYST ASPIRATION Left 1992   neg   CHOLECYSTECTOMY     dilation and evacuation     x2   HYSTEROSCOPY WITH D & C N/A 02/04/2018   Procedure: DILATATION AND CURETTAGE Melton Krebs, fractional;  Surgeon: Schermerhorn, Ihor Austin, MD;  Location: [facility] ORS;  Service: Gynecology;  Laterality: N/A;    Family Psychiatric History: Please see initial evaluation for full details. I have reviewed the history. No updates at this time.     Family History:  Family History  Problem Relation Age of Onset   Ovarian cancer Mother 53   Bipolar disorder Mother    Breast cancer Paternal Aunt 62   Breast cancer Cousin        maternal   Alcohol abuse Father     Social History:  Social History   Socioeconomic History   Marital status: Married    Spouse name: michael   Number of children: 1   Years of education: Not on file   Highest education level: Master's degree (e.g., MA, MS, MEng, MEd, MSW, MBA)  Occupational History   Not on file  Tobacco Use   Smoking status: Never   Smokeless tobacco: Never  Vaping Use   Vaping Use: Never used  Substance and Sexual Activity    Alcohol use: No   Drug use: No   Sexual activity: Not Currently  Other Topics Concern   Not on file  Social History Narrative   Not on file   Social Determinants of Health   Financial Resource Strain: Not on file  Food Insecurity: Not on file  Transportation Needs: Not on file  Physical Activity: Not on file  Stress: Not on file  Social Connections: Not on file    Allergies:  Allergies  Allergen Reactions   Penicillins Hives    Has patient had a PCN reaction causing immediate rash, facial/tongue/throat swelling, SOB or lightheadedness with hypotension: No Has patient had a PCN reaction causing severe rash involving mucus membranes or skin necrosis: Yes Has patient had a PCN reaction that required hospitalization: No Has patient had a PCN reaction occurring within the last 10 years: No If all of the above answers are "NO", then may proceed with Cephalosporin use.    Stadol [Butorphanol] Anxiety    Metabolic Disorder Labs: No results found for: "HGBA1C", "MPG" No results found for: "PROLACTIN" No results found for: "CHOL", "TRIG", "HDL", "CHOLHDL", "VLDL", "LDLCALC" No results found for: "TSH"  Therapeutic Level Labs: No results found for: "LITHIUM" No results found for: "VALPROATE" No results found for: "  CBMZ"  Current Medications: Current Outpatient Medications  Medication Sig Dispense Refill   allopurinol (ZYLOPRIM) 100 MG tablet Take 100 mg by mouth daily.     bisoprolol-hydrochlorothiazide (ZIAC) 5-6.25 MG tablet Take 1 tablet by mouth daily.     Ca Carbonate-Mag Hydroxide (ROLAIDS PO) Take 1 tablet by mouth daily as needed (acid reflux).     cetirizine (ZYRTEC) 10 MG chewable tablet Chew 10 mg by mouth daily.     colchicine 0.6 MG tablet Take 0.6 mg by mouth daily.     DULoxetine (CYMBALTA) 30 MG capsule Take 1 capsule (30 mg total) by mouth daily. 30 capsule 1   ezetimibe (ZETIA) 10 MG tablet Take 10 mg by mouth daily.     glimepiride (AMARYL) 4 MG tablet Take  4 mg by mouth daily with breakfast.     hydrOXYzine (ATARAX) 25 MG tablet Take 1 tablet (25 mg total) by mouth daily as needed for anxiety. 30 tablet 0   lamoTRIgine (LAMICTAL) 200 MG tablet Take 1 tablet (200 mg total) by mouth daily. 90 tablet 1   levothyroxine (SYNTHROID, LEVOTHROID) 75 MCG tablet Take 75 mcg by mouth daily before breakfast.     meclizine (ANTIVERT) 25 MG tablet Take 25 mg by mouth 3 (three) times daily as needed for dizziness.     metFORMIN (GLUCOPHAGE) 500 MG tablet Take 500 mg by mouth daily with breakfast.     valsartan (DIOVAN) 80 MG tablet Take 80 mg by mouth daily.     No current facility-administered medications for this visit.     Musculoskeletal: Strength & Muscle Tone: within normal limits Gait & Station: normal Patient leans: N/A  Psychiatric Specialty Exam: Review of Systems  There were no vitals taken for this visit.There is no height or weight on file to calculate BMI.  General Appearance: {Appearance:22683}  Eye Contact:  {BHH EYE CONTACT:22684}  Speech:  Clear and Coherent  Volume:  Normal  Mood:  {BHH MOOD:22306}  Affect:  {Affect (PAA):22687}  Thought Process:  Coherent  Orientation:  Full (Time, Place, and Person)  Thought Content: Logical   Suicidal Thoughts:  {ST/HT (PAA):22692}  Homicidal Thoughts:  {ST/HT (PAA):22692}  Memory:  Immediate;   Good  Judgement:  {Judgement (PAA):22694}  Insight:  {Insight (PAA):22695}  Psychomotor Activity:  Normal  Concentration:  Concentration: Good and Attention Span: Good  Recall:  Good  Fund of Knowledge: Good  Language: Good  Akathisia:  No  Handed:  Right  AIMS (if indicated): not done  Assets:  Communication Skills Desire for Improvement  ADL's:  Intact  Cognition: WNL  Sleep:  {B[facility] GOOD/FAIR/POOR:22877}   Screenings: GAD-7    Loss adjuster, chartered Office Visit from 06/08/2022 in [facility] Office Visit from 04/10/2022 in [facility] Office Visit from 02/09/2022 in [facility] Office Visit from 12/13/2021 in [facility] Office Visit from 10/31/2021 in [facility]  Total GAD-7 Score 14 15 16 14 14       PHQ2-9    Flowsheet Row Office Visit from 06/08/2022 in [facility] Office Visit from 04/10/2022 in [facility] Office Visit from 02[facility]09/2022 in [facility] Office Visit from 12/13/2021 in [facility] Office Visit from 10/31/2021 in [facility]  PHQ-2 Total Score 5 4 5 4 5   PHQ-9  Total Score 16 19 16 17 17       Flowsheet Row ED from 07/26/2022 in [facility] Department at [facility]  C-SSRS RISK CATEGORY No Risk        Assessment and Plan:  Victoria Patterson is a 62 y.o. year old female with a history of bipolar II disorder, OCD (germ phobia), ADHD, history of eating disorder both anorexia and bulimia,  hypertension, diabetes, hypothyroidism. bilateral primary. The patient presents for follow up appointment for below.     1. Obsessive-compulsive disorder, unspecified type 2. Bipolar II disorder (HCC) Acute stressors include:  financial strain Other stressors include: unemployment since Sept 2022 (on disability), absence of nurturing from her mother/financial strain when she was a child     History: seen by Dr. Maryruth Bun for many years, subthreshold hypomanic symptoms in response to her husband's infidelity She continues to experience the oppressive symptoms and the OCD symptoms (repetitive handwashing due to germ phobia, and obsessive thoughts, being worried about her son).  Although it has been advised over the past several months to consider switching to venlafaxine, she  has not been able to do so due to fear of adverse reaction.  Will continue current dose of duloxetine for OCD, depression.  Will continue lamotrigine for bipolar disorder.  She is strongly encouraged to continue to see her therapist.    3. Insomnia, unspecified type She continues to report snoring, daytime fatigue and middle insomnia.  Although she is concerned that she may not be able to use CPAP machine, she agrees to at least contact the office for possible available options in her evaluation/treatment.    Plan Continue duloxetine 60 mg daily (although venlafaxine uptitration was ordered to the pharmacy in the past, she has not started this.) Continue lamotrigine 200 mg daily Referred for evaluation of sleep apnea Next appointment: 6/20 at 11 AM for 30 mins, in person - She will see Lyda Perone for therapy      Past trials of medication: fluoxetine, sertraline, lexapro, Vraylar, Ability,   The patient demonstrates the following risk factors for suicide: Chronic risk factors for suicide include: psychiatric disorder of OCD, bipolar disorder . Acute risk factors for suicide include: unemployment. Protective factors for this patient include: positive social support, responsibility to others (children, family), coping skills, and hope for the future. Considering these factors, the overall suicide risk at this point appears to be low. Patient is appropriate for outpatient follow up.     Collaboration of Care: Collaboration of Care: {BH OP Collaboration of Care:21014065}  Patient/Guardian was advised Release of Information must be obtained prior to any record release in order to collaborate their care with an outside provider. Patient/Guardian was advised if they have not already done so to contact the registration department to sign all necessary forms in order for [place] to release information regarding their care.   Consent: Patient/Guardian gives verbal consent for treatment and assignment of  benefits for services provided during this visit. Patient/Guardian expressed understanding and agreed to proceed.    Neysa Hotter, MD 08/24/2022, 8:22 AM

## 2022-08-29 ENCOUNTER — Ambulatory Visit: Payer: 59 | Admitting: Psychiatry

## 2022-09-01 DIAGNOSIS — M10079 Idiopathic gout, unspecified ankle and foot: Secondary | ICD-10-CM | POA: Insufficient documentation

## 2022-09-01 DIAGNOSIS — Z79899 Other long term (current) drug therapy: Secondary | ICD-10-CM | POA: Diagnosis not present

## 2022-09-01 DIAGNOSIS — M79671 Pain in right foot: Secondary | ICD-10-CM | POA: Diagnosis not present

## 2022-09-07 DIAGNOSIS — D649 Anemia, unspecified: Secondary | ICD-10-CM | POA: Diagnosis not present

## 2022-09-07 DIAGNOSIS — R197 Diarrhea, unspecified: Secondary | ICD-10-CM | POA: Diagnosis not present

## 2022-09-07 DIAGNOSIS — M109 Gout, unspecified: Secondary | ICD-10-CM | POA: Diagnosis not present

## 2022-09-07 DIAGNOSIS — E118 Type 2 diabetes mellitus with unspecified complications: Secondary | ICD-10-CM | POA: Diagnosis not present

## 2022-09-12 DIAGNOSIS — F422 Mixed obsessional thoughts and acts: Secondary | ICD-10-CM | POA: Diagnosis not present

## 2022-09-18 ENCOUNTER — Other Ambulatory Visit: Payer: Self-pay | Admitting: Psychiatry

## 2022-09-18 DIAGNOSIS — D649 Anemia, unspecified: Secondary | ICD-10-CM | POA: Diagnosis not present

## 2022-09-18 MED ORDER — DULOXETINE HCL 30 MG PO CPEP: 30.0000 mg | ORAL_CAPSULE | Freq: Every day | ORAL | 0 refills | Status: DC

## 2022-09-26 DIAGNOSIS — F422 Mixed obsessional thoughts and acts: Secondary | ICD-10-CM | POA: Diagnosis not present

## 2022-09-27 DIAGNOSIS — R194 Change in bowel habit: Secondary | ICD-10-CM | POA: Diagnosis not present

## 2022-09-27 DIAGNOSIS — E039 Hypothyroidism, unspecified: Secondary | ICD-10-CM | POA: Diagnosis not present

## 2022-09-27 DIAGNOSIS — I10 Essential (primary) hypertension: Secondary | ICD-10-CM | POA: Diagnosis not present

## 2022-09-27 DIAGNOSIS — E782 Mixed hyperlipidemia: Secondary | ICD-10-CM | POA: Diagnosis not present

## 2022-09-27 DIAGNOSIS — G720 Drug-induced myopathy: Secondary | ICD-10-CM | POA: Diagnosis not present

## 2022-09-27 DIAGNOSIS — T466X5A Adverse effect of antihyperlipidemic and antiarteriosclerotic drugs, initial encounter: Secondary | ICD-10-CM | POA: Diagnosis not present

## 2022-09-27 DIAGNOSIS — E118 Type 2 diabetes mellitus with unspecified complications: Secondary | ICD-10-CM | POA: Diagnosis not present

## 2022-09-27 DIAGNOSIS — Z79899 Other long term (current) drug therapy: Secondary | ICD-10-CM | POA: Diagnosis not present

## 2022-09-27 DIAGNOSIS — R8290 Unspecified abnormal findings in urine: Secondary | ICD-10-CM | POA: Diagnosis not present

## 2022-09-28 DIAGNOSIS — R8290 Unspecified abnormal findings in urine: Secondary | ICD-10-CM | POA: Diagnosis not present

## 2022-10-02 ENCOUNTER — Other Ambulatory Visit: Payer: Self-pay

## 2022-10-02 ENCOUNTER — Inpatient Hospital Stay
Admission: EM | Admit: 2022-10-02 | Discharge: 2022-10-03 | DRG: 638 | Disposition: A | Discharge status: Home / Self Care | Payer: 59 | Attending: Obstetrics and Gynecology | Admitting: Obstetrics and Gynecology

## 2022-10-02 DIAGNOSIS — F32A Depression, unspecified: Secondary | ICD-10-CM | POA: Diagnosis not present

## 2022-10-02 DIAGNOSIS — Z6832 Body mass index (BMI) 32.0-32.9, adult: Secondary | ICD-10-CM

## 2022-10-02 DIAGNOSIS — E1122 Type 2 diabetes mellitus with diabetic chronic kidney disease: Secondary | ICD-10-CM | POA: Diagnosis present

## 2022-10-02 DIAGNOSIS — N3000 Acute cystitis without hematuria: Secondary | ICD-10-CM | POA: Diagnosis not present

## 2022-10-02 DIAGNOSIS — I129 Hypertensive chronic kidney disease with stage 1 through stage 4 chronic kidney disease, or unspecified chronic kidney disease: Secondary | ICD-10-CM | POA: Diagnosis present

## 2022-10-02 DIAGNOSIS — E669 Obesity, unspecified: Secondary | ICD-10-CM | POA: Diagnosis not present

## 2022-10-02 DIAGNOSIS — R638 Other symptoms and signs concerning food and fluid intake: Secondary | ICD-10-CM

## 2022-10-02 DIAGNOSIS — Z9049 Acquired absence of other specified parts of digestive tract: Secondary | ICD-10-CM

## 2022-10-02 DIAGNOSIS — R680 Hypothermia, not associated with low environmental temperature: Secondary | ICD-10-CM | POA: Diagnosis not present

## 2022-10-02 DIAGNOSIS — B961 Klebsiella pneumoniae [K. pneumoniae] as the cause of diseases classified elsewhere: Secondary | ICD-10-CM | POA: Diagnosis not present

## 2022-10-02 DIAGNOSIS — E039 Hypothyroidism, unspecified: Secondary | ICD-10-CM | POA: Diagnosis not present

## 2022-10-02 DIAGNOSIS — E11649 Type 2 diabetes mellitus with hypoglycemia without coma: Secondary | ICD-10-CM | POA: Diagnosis not present

## 2022-10-02 DIAGNOSIS — Z880 Allergy status to penicillin: Secondary | ICD-10-CM

## 2022-10-02 DIAGNOSIS — Z87440 Personal history of urinary (tract) infections: Secondary | ICD-10-CM

## 2022-10-02 DIAGNOSIS — K760 Fatty (change of) liver, not elsewhere classified: Secondary | ICD-10-CM | POA: Diagnosis present

## 2022-10-02 DIAGNOSIS — E872 Acidosis, unspecified: Secondary | ICD-10-CM | POA: Diagnosis present

## 2022-10-02 DIAGNOSIS — Z7984 Long term (current) use of oral hypoglycemic drugs: Secondary | ICD-10-CM | POA: Diagnosis not present

## 2022-10-02 DIAGNOSIS — I10 Essential (primary) hypertension: Secondary | ICD-10-CM | POA: Diagnosis present

## 2022-10-02 DIAGNOSIS — F419 Anxiety disorder, unspecified: Secondary | ICD-10-CM | POA: Diagnosis not present

## 2022-10-02 DIAGNOSIS — Z79899 Other long term (current) drug therapy: Secondary | ICD-10-CM

## 2022-10-02 DIAGNOSIS — N1831 Chronic kidney disease, stage 3a: Secondary | ICD-10-CM | POA: Diagnosis present

## 2022-10-02 DIAGNOSIS — B9689 Other specified bacterial agents as the cause of diseases classified elsewhere: Secondary | ICD-10-CM | POA: Diagnosis present

## 2022-10-02 DIAGNOSIS — N179 Acute kidney failure, unspecified: Secondary | ICD-10-CM | POA: Diagnosis present

## 2022-10-02 DIAGNOSIS — J45909 Unspecified asthma, uncomplicated: Secondary | ICD-10-CM | POA: Diagnosis present

## 2022-10-02 DIAGNOSIS — E876 Hypokalemia: Secondary | ICD-10-CM | POA: Insufficient documentation

## 2022-10-02 DIAGNOSIS — F909 Attention-deficit hyperactivity disorder, unspecified type: Secondary | ICD-10-CM | POA: Diagnosis not present

## 2022-10-02 DIAGNOSIS — Z79890 Hormone replacement therapy (postmenopausal): Secondary | ICD-10-CM

## 2022-10-02 DIAGNOSIS — E119 Type 2 diabetes mellitus without complications: Secondary | ICD-10-CM

## 2022-10-02 DIAGNOSIS — E162 Hypoglycemia, unspecified: Secondary | ICD-10-CM | POA: Diagnosis not present

## 2022-10-02 DIAGNOSIS — G894 Chronic pain syndrome: Secondary | ICD-10-CM | POA: Diagnosis not present

## 2022-10-02 DIAGNOSIS — Z818 Family history of other mental and behavioral disorders: Secondary | ICD-10-CM | POA: Diagnosis not present

## 2022-10-02 DIAGNOSIS — Z888 Allergy status to other drugs, medicaments and biological substances status: Secondary | ICD-10-CM

## 2022-10-02 DIAGNOSIS — N390 Urinary tract infection, site not specified: Secondary | ICD-10-CM | POA: Diagnosis not present

## 2022-10-02 DIAGNOSIS — F429 Obsessive-compulsive disorder, unspecified: Secondary | ICD-10-CM | POA: Diagnosis present

## 2022-10-02 DIAGNOSIS — F418 Other specified anxiety disorders: Secondary | ICD-10-CM | POA: Diagnosis present

## 2022-10-02 LAB — CBC WITH DIFFERENTIAL/PLATELET
Abs Immature Granulocytes: 0.03 10*3/uL (ref 0.00–0.07)
Basophils Absolute: 0 10*3/uL (ref 0.0–0.1)
Basophils Relative: 0 %
Eosinophils Absolute: 0.1 10*3/uL (ref 0.0–0.5)
Eosinophils Relative: 2 %
HCT: 33.7 % — ABNORMAL LOW (ref 36.0–46.0)
Hemoglobin: 11.6 g/dL — ABNORMAL LOW (ref 12.0–15.0)
Immature Granulocytes: 0 %
Lymphocytes Relative: 22 %
Lymphs Abs: 2 10*3/uL (ref 0.7–4.0)
MCH: 31.4 pg (ref 26.0–34.0)
MCHC: 34.4 g/dL (ref 30.0–36.0)
MCV: 91.3 fL (ref 80.0–100.0)
Monocytes Absolute: 0.6 10*3/uL (ref 0.1–1.0)
Monocytes Relative: 6 %
Neutro Abs: 6.1 10*3/uL (ref 1.7–7.7)
Neutrophils Relative %: 70 %
Platelets: 338 10*3/uL (ref 150–400)
RBC: 3.69 MIL/uL — ABNORMAL LOW (ref 3.87–5.11)
RDW: 14.5 % (ref 11.5–15.5)
WBC: 8.9 10*3/uL (ref 4.0–10.5)
nRBC: 0 % (ref 0.0–0.2)

## 2022-10-02 LAB — COMPREHENSIVE METABOLIC PANEL
ALT: 15 U/L (ref 0–44)
AST: 19 U/L (ref 15–41)
Albumin: 4.5 g/dL (ref 3.5–5.0)
Alkaline Phosphatase: 69 U/L (ref 38–126)
Anion gap: 11 (ref 5–15)
BUN: 13 mg/dL (ref 8–23)
CO2: 23 mmol/L (ref 22–32)
Calcium: 9.5 mg/dL (ref 8.9–10.3)
Chloride: 96 mmol/L — ABNORMAL LOW (ref 98–111)
Creatinine, Ser: 1.36 mg/dL — ABNORMAL HIGH (ref 0.44–1.00)
GFR, Estimated: 44 mL/min — ABNORMAL LOW (ref >60)
Glucose, Bld: 67 mg/dL — ABNORMAL LOW (ref 70–99)
Potassium: 3.1 mmol/L — ABNORMAL LOW (ref 3.5–5.1)
Sodium: 130 mmol/L — ABNORMAL LOW (ref 135–145)
Total Bilirubin: 0.3 mg/dL (ref 0.3–1.2)
Total Protein: 7.5 g/dL (ref 6.5–8.1)

## 2022-10-02 LAB — LACTIC ACID, PLASMA: Lactic Acid, Venous: 2 mmol/L (ref 0.5–1.9)

## 2022-10-02 LAB — TROPONIN I (HIGH SENSITIVITY): Troponin I (High Sensitivity): 4 ng/L (ref <18)

## 2022-10-02 LAB — CBG MONITORING, ED: Glucose-Capillary: 42 mg/dL — CL (ref 70–99)

## 2022-10-02 MED ORDER — HYDROCODONE-ACETAMINOPHEN 5-325 MG PO TABS: 1.0000 | ORAL_TABLET | ORAL | Status: DC | PRN

## 2022-10-02 MED ORDER — DEXTROSE-SODIUM CHLORIDE 5-0.9 % IV SOLN: INTRAVENOUS | Status: DC

## 2022-10-02 MED ORDER — ALLOPURINOL 100 MG PO TABS
100.0000 mg | ORAL_TABLET | Freq: Every day | ORAL | Status: DC
  Filled 2022-10-02: qty 1

## 2022-10-02 MED ORDER — DULOXETINE HCL 30 MG PO CPEP
30.0000 mg | ORAL_CAPSULE | Freq: Every day | ORAL | Status: DC
  Administered 2022-10-03: 30 mg via ORAL
  Filled 2022-10-02: qty 1

## 2022-10-02 MED ORDER — LAMOTRIGINE 25 MG PO TABS
200.0000 mg | ORAL_TABLET | Freq: Every day | ORAL | Status: DC
  Administered 2022-10-03: 200 mg via ORAL
  Filled 2022-10-02: qty 8

## 2022-10-02 MED ORDER — ONDANSETRON HCL 4 MG/2ML IJ SOLN: 4.0000 mg | Freq: Four times a day (QID) | INTRAMUSCULAR | Status: DC | PRN

## 2022-10-02 MED ORDER — DEXTROSE 50 % IV SOLN
50.0000 mL | Freq: Once | INTRAVENOUS | Status: AC
  Administered 2022-10-02: 50 mL via INTRAVENOUS
  Filled 2022-10-02: qty 50

## 2022-10-02 MED ORDER — ONDANSETRON HCL 4 MG PO TABS: 4.0000 mg | ORAL_TABLET | Freq: Four times a day (QID) | ORAL | Status: DC | PRN

## 2022-10-02 MED ORDER — INSULIN ASPART 100 UNIT/ML IJ SOLN: 0.0000 [IU] | Freq: Every day | INTRAMUSCULAR | Status: DC

## 2022-10-02 MED ORDER — DEXTROSE 50 % IV SOLN: 50.0000 mL | INTRAVENOUS | Status: DC | PRN

## 2022-10-02 MED ORDER — ACETAMINOPHEN 650 MG RE SUPP: 650.0000 mg | Freq: Four times a day (QID) | RECTAL | Status: DC | PRN

## 2022-10-02 MED ORDER — INSULIN ASPART 100 UNIT/ML IJ SOLN: 0.0000 [IU] | Freq: Three times a day (TID) | INTRAMUSCULAR | Status: DC

## 2022-10-02 MED ORDER — SODIUM CHLORIDE 0.9 % IV SOLN
2.0000 g | Freq: Once | INTRAVENOUS | Status: AC
  Administered 2022-10-02: 2 g via INTRAVENOUS
  Filled 2022-10-02: qty 20

## 2022-10-02 MED ORDER — POTASSIUM CHLORIDE CRYS ER 20 MEQ PO TBCR
40.0000 meq | EXTENDED_RELEASE_TABLET | Freq: Once | ORAL | Status: AC
  Administered 2022-10-03: 40 meq via ORAL
  Filled 2022-10-02: qty 2

## 2022-10-02 MED ORDER — DEXTROSE 5 % IV SOLN: INTRAVENOUS | Status: DC

## 2022-10-02 MED ORDER — LEVOTHYROXINE SODIUM 50 MCG PO TABS
75.0000 ug | ORAL_TABLET | Freq: Every day | ORAL | Status: DC
  Administered 2022-10-03: 75 ug via ORAL
  Filled 2022-10-02: qty 2

## 2022-10-02 MED ORDER — ENOXAPARIN SODIUM 40 MG/0.4ML IJ SOSY: 40.0000 mg | PREFILLED_SYRINGE | INTRAMUSCULAR | Status: DC

## 2022-10-02 MED ORDER — ACETAMINOPHEN 325 MG PO TABS: 650.0000 mg | ORAL_TABLET | Freq: Four times a day (QID) | ORAL | Status: DC | PRN

## 2022-10-02 NOTE — Assessment & Plan Note (Signed)
Continue Cymbalta and lamotrigine

## 2022-10-02 NOTE — Assessment & Plan Note (Signed)
Continue levothyroxine 

## 2022-10-02 NOTE — Assessment & Plan Note (Signed)
Continue duloxetine and hydroxyzine

## 2022-10-02 NOTE — Assessment & Plan Note (Addendum)
Possibly related to metformin Lower suspicion for sepsis, as not quite meeting criteria Continue to monitor Hold metformin Acting improvement with hydration

## 2022-10-02 NOTE — ED Notes (Signed)
CBG 42 in triage.

## 2022-10-02 NOTE — Assessment & Plan Note (Signed)
Was started on Bactrim 2 days prior for recurrent UTI.  Had Klebsiella UTI in May Received Rocephin in the ED Will hold off on antibiotics pending culture

## 2022-10-02 NOTE — ED Provider Notes (Signed)
[facility] Provider Note    Event Date/Time   First MD Initiated Contact with Patient 10/02/22 2141     (approximate)   History   Hypoglycemia   HPI  Victoria Patterson is a 62 y.o. female  with PMHx recurrent UTI, HTN, Hypothyroidism, DM here with hypoglycemia and weakness. Pt reports that for the past day she has felt fatigued, weak, and had ongoing, recurrent hypoglycemia. She felt shaky and took her BG and it was in the 50s earlier, she ate some candy/cookies and it increased but then continued to drop to the 50-60s. She is on metformin only per her report. She has a h/o hypoglycemia in setting of a UTI with sepsis recently, and she has been on Bactrim x 2 days for UTI currently. No fever or chills.       Physical Exam   Triage Vital Signs: ED Triage Vitals  Encounter Vitals Group     BP 10/02/22 2133 (!) 174/95     Systolic BP Percentile --      Diastolic BP Percentile --      Pulse Rate 10/02/22 2133 74     Resp 10/02/22 2133 20     Temp 10/02/22 2133 97.6 F (36.4 C)     Temp Source 10/02/22 2133 Oral     SpO2 10/02/22 2133 100 %     Weight 10/02/22 2141 182 lb 15.7 oz (83 kg)     Height 10/02/22 2141 5\' 2"  (1.575 m)     Head Circumference --      Peak Flow --      Pain Score 10/02/22 2141 0     Pain Loc --      Pain Education --      Exclude from Growth Chart --     Most recent vital signs: Vitals:   10/02/22 2230 10/02/22 2300  BP: 138/80 118/68  Pulse: 65 62  Resp: (!) 24 18  Temp:    SpO2: 98% 100%     General: Awake, no distress.  CV:  Good peripheral perfusion. RRR.  Resp:  Normal work of breathing. Lungs clear. Abd:  No distention. No tenderness. No rebound or guarding. Other:  Mildly dry MM. Alert and oriented. No focal deficits   ED Results / Procedures / Treatments   Labs (all labs ordered are listed, but only abnormal results are displayed) Labs Reviewed  CBC WITH DIFFERENTIAL/PLATELET - Abnormal; Notable for  the following components:      Result Value   RBC 3.69 (*)    Hemoglobin 11.6 (*)    HCT 33.7 (*)    All other components within normal limits  COMPREHENSIVE METABOLIC PANEL - Abnormal; Notable for the following components:   Sodium 130 (*)    Potassium 3.1 (*)    Chloride 96 (*)    Glucose, Bld 67 (*)    Creatinine, Ser 1.36 (*)    GFR, Estimated 44 (*)    All other components within normal limits  CBG MONITORING, ED - Abnormal; Notable for the following components:   Glucose-Capillary 42 (*)    All other components within normal limits  URINALYSIS, ROUTINE W REFLEX MICROSCOPIC  LACTIC ACID, PLASMA  LACTIC ACID, PLASMA  TROPONIN I (HIGH SENSITIVITY)     EKG Normal sinus rhythm, VR 65. PR 190, QRS 118, QTc 458. No acute ST elevations or depressions. No ischemia or infarct.   RADIOLOGY    I also independently reviewed and agree with radiologist  interpretations.   PROCEDURES:  Critical Care performed: No   MEDICATIONS ORDERED IN ED: Medications  dextrose 5 %-0.9 % sodium chloride infusion ( Intravenous New Bag/Given 10/02/22 2305)  dextrose 50 % solution 50 mL (50 mLs Intravenous Given 10/02/22 2241)  cefTRIAXone (ROCEPHIN) 2 g in sodium chloride 0.9 % 100 mL IVPB (0 g Intravenous Stopped 10/02/22 2304)     IMPRESSION / MDM / ASSESSMENT AND PLAN / ED COURSE  I reviewed the triage vital signs and the nursing notes.                              Differential diagnosis includes, but is not limited to, hypoglycemia 2/2 medication effect, poor PO intake, sepsis from UTI  Patient's presentation is most consistent with acute presentation with potential threat to life or bodily function.  The patient is on the cardiac monitor to evaluate for evidence of arrhythmia and/or significant heart rate changes  62 yo F with PMHx DM, HTN, recurrent UTIs, here with hypoglycemia. Pt has had poor PO intake and recent dx of UTI, and is on metformin and amaryl per review of records. She  has also recently lost significant weight recently. Will place on D5 and plan to admit given recurrence and persistence of sx throughout the day, with long-acting oral antihyperglycemics. Reviewed culture from PCP - will give rocephin for current UTI. She does not appear septic. Admit to medicine. Labs o/w reassuring. No significant AKI.  No leukocytosis.    FINAL CLINICAL IMPRESSION(S) / ED DIAGNOSES   Final diagnoses:  Hypoglycemia  Acute cystitis without hematuria     Rx / DC Orders   ED Discharge Orders     None        Note:  This document was prepared using Dragon voice recognition software and may include unintentional dictation errors.   Shaune Pollack, MD 10/02/22 [phone]

## 2022-10-02 NOTE — Assessment & Plan Note (Signed)
KCl 40 mEq p.o. by mouth and monitor

## 2022-10-02 NOTE — H&P (Signed)
History and Physical    Patient: Victoria Patterson ZOX:[id number] DOB: 1961/01/14 DOA: 10/02/2022 DOS: the patient was seen and examined on 10/02/2022 PCP: Marguarite Arbour, MD  Patient coming from: Home  Chief Complaint:  Chief Complaint  Patient presents with   Hypoglycemia    HPI: Victoria Patterson is a 62 y.o. female with medical history significant for HTN, DM, asthma, depression, hypothyroidism, CKD 3 a, hospitalized in May 2024 with sepsis secondary to Klebsiella UTI presenting with altered mental status, hypoglycemia and hypothermia with lactic acidosis, who presents to the EDWith similar symptomatology of weakness and low blood sugars.  During the day her blood sugar was in the 50s and she kept eating to bring it up but it kept dropping.  She has been on Bactrim for the past 2 days for UTI by her PCP.  She takes metformin and glimepiride for her diabetes.  She denies fever or chills or abdominal pain.  Has no nausea or vomiting, diarrhea.  Denies headache or visual disturbance or one-sided weakness numbness or tingling. ED course and data review: BP 174/95 initially however normalized without intervention.  Vitals otherwise within normal limits except for a one-time respiratory rate of 24.  Labs significant for normal WBC but with first lactic of 2.0.  Hemoglobin 11.6 which is her baseline.  Creatinine 1.36 slightly up from baseline of 1.12.  Potassium 3.1.  Blood glucose 67 with lab draw but was initially 42.. EKG, personally viewed and interpreted showing sinus rhythm at 65 with nonspecific ST-T wave changes. No imaging done Patient was given D50 and subsequently started on D5 She was treated with Rocephin given that she is currently on Bactrim for UTI Hospitalist consulted for admission.   Review of Systems: As mentioned in the history of present illness. All other systems reviewed and are negative.  Past Medical History:  Diagnosis Date   ADHD (attention deficit hyperactivity  disorder)    Anxiety    Arthritis    Asthma    controlled for many years   Depression    Diabetes mellitus, type II (HCC)    Fatty liver    Hypertension    Hypothyroidism    Obsessive-compulsive disorder    Past Surgical History:  Procedure Laterality Date   BREAST CYST ASPIRATION Left 1992   neg   CHOLECYSTECTOMY     dilation and evacuation     x2   HYSTEROSCOPY WITH D & C N/A 02/04/2018   Procedure: DILATATION AND CURETTAGE Melton Krebs, fractional;  Surgeon: Schermerhorn, Ihor Austin, MD;  Location: [facility] ORS;  Service: Gynecology;  Laterality: N/A;   Social History:  reports that she has never smoked. She has never used smokeless tobacco. She reports that she does not drink alcohol and does not use drugs.  Allergies  Allergen Reactions   Penicillins Hives    Has patient had a PCN reaction causing immediate rash, facial/tongue/throat swelling, SOB or lightheadedness with hypotension: No Has patient had a PCN reaction causing severe rash involving mucus membranes or skin necrosis: Yes Has patient had a PCN reaction that required hospitalization: No Has patient had a PCN reaction occurring within the last 10 years: No If all of the above answers are "NO", then may proceed with Cephalosporin use.    Stadol [Butorphanol] Anxiety    Family History  Problem Relation Age of Onset   Ovarian cancer Mother 76   Bipolar disorder Mother    Breast cancer Paternal Aunt 58   Breast cancer Cousin  maternal   Alcohol abuse Father     Prior to Admission medications   Medication Sig Start Date End Date Taking? Authorizing Provider  allopurinol (ZYLOPRIM) 100 MG tablet Take 100 mg by mouth daily. 07/14/22 07/14/23  [provider]  bisoprolol-hydrochlorothiazide (ZIAC) 5-6.25 MG tablet Take 1 tablet by mouth daily.    [provider]  Ca Carbonate-Mag Hydroxide (ROLAIDS PO) Take 1 tablet by mouth daily as needed (acid reflux).    [provider]  cetirizine  (ZYRTEC) 10 MG chewable tablet Chew 10 mg by mouth daily.    [provider]  colchicine 0.6 MG tablet Take 0.6 mg by mouth daily.    [provider]  DULoxetine (CYMBALTA) 30 MG capsule Take 1 capsule (30 mg total) by mouth daily. 09/18/22 10/18/22  Neysa Hotter, MD  ezetimibe (ZETIA) 10 MG tablet Take 10 mg by mouth daily.    [provider]  glimepiride (AMARYL) 4 MG tablet Take 4 mg by mouth daily with breakfast.    [provider]  hydrOXYzine (ATARAX) 25 MG tablet Take 1 tablet (25 mg total) by mouth daily as needed for anxiety. 12/08/21   Neysa Hotter, MD  lamoTRIgine (LAMICTAL) 200 MG tablet Take 1 tablet (200 mg total) by mouth daily. 01/30/22 07/29/22  Neysa Hotter, MD  levothyroxine (SYNTHROID, LEVOTHROID) 75 MCG tablet Take 75 mcg by mouth daily before breakfast.    [provider]  meclizine (ANTIVERT) 25 MG tablet Take 25 mg by mouth 3 (three) times daily as needed for dizziness. 07/14/22   [provider]  metFORMIN (GLUCOPHAGE) 500 MG tablet Take 500 mg by mouth daily with breakfast.    [provider]  valsartan (DIOVAN) 80 MG tablet Take 80 mg by mouth daily.    [provider]    Physical Exam: Vitals:   10/02/22 2133 10/02/22 2141 10/02/22 2230 10/02/22 2300  BP: (!) 174/95  138/80 118/68  Pulse: 74  65 62  Resp: 20  (!) 24 18  Temp: 97.6 F (36.4 C)     TempSrc: Oral     SpO2: 100%  98% 100%  Weight:  83 kg    Height:  5\' 2"  (1.575 m)     Physical Exam  Labs on Admission: I have personally reviewed following labs and imaging studies  CBC: Recent Labs  Lab 10/02/22 2144  WBC 8.9  NEUTROABS 6.1  HGB 11.6*  HCT 33.7*  MCV 91.3  PLT 338   Basic Metabolic Panel: Recent Labs  Lab 10/02/22 2144  NA 130*  K 3.1*  CL 96*  CO2 23  GLUCOSE 67*  BUN 13  CREATININE 1.36*  CALCIUM 9.5   GFR: Estimated Creatinine Clearance: 42.9 mL/min (A) (by C-G formula based on SCr of 1.36 mg/dL  (H)). Liver Function Tests: Recent Labs  Lab 10/02/22 2144  AST 19  ALT 15  ALKPHOS 69  BILITOT 0.3  PROT 7.5  ALBUMIN 4.5   No results for input(s): "LIPASE", "AMYLASE" in the last 168 hours. No results for input(s): "AMMONIA" in the last 168 hours. Coagulation Profile: No results for input(s): "INR", "PROTIME" in the last 168 hours. Cardiac Enzymes: No results for input(s): "CKTOTAL", "CKMB", "CKMBINDEX", "TROPONINI" in the last 168 hours. BNP (last 3 results) No results for input(s): "PROBNP" in the last 8760 hours. HbA1C: No results for input(s): "HGBA1C" in the last 72 hours. CBG: Recent Labs  Lab 10/02/22 2132  GLUCAP 42*   Lipid Profile: No results for  input(s): "CHOL", "HDL", "LDLCALC", "TRIG", "CHOLHDL", "LDLDIRECT" in the last 72 hours. Thyroid Function Tests: No results for input(s): "TSH", "T4TOTAL", "FREET4", "T3FREE", "THYROIDAB" in the last 72 hours. Anemia Panel: No results for input(s): "VITAMINB12", "FOLATE", "FERRITIN", "TIBC", "IRON", "RETICCTPCT" in the last 72 hours. Urine analysis:    Component Value Date/Time   COLORURINE YELLOW (A) 07/26/2022 0628   APPEARANCEUR HAZY (A) 07/26/2022 0628   LABSPEC 1.014 07/26/2022 0628   PHURINE 6.0 07/26/2022 0628   GLUCOSEU NEGATIVE 07/26/2022 0628   HGBUR NEGATIVE 07/26/2022 0628   BILIRUBINUR NEGATIVE 07/26/2022 0628   KETONESUR NEGATIVE 07/26/2022 0628   PROTEINUR NEGATIVE 07/26/2022 0628   NITRITE NEGATIVE 07/26/2022 0628   LEUKOCYTESUR MODERATE (A) 07/26/2022 0628    Radiological Exams on Admission: No results found.   Data Reviewed: Relevant notes from primary care and specialist visits, past discharge summaries as available in EHR, including Care Everywhere. Prior diagnostic testing as pertinent to current admission diagnoses Updated medications and problem lists for reconciliation ED course, including vitals, labs, imaging, treatment and response to treatment Triage notes, nursing and  pharmacy notes and ED provider's notes Notable results as noted in HPI   Assessment and Plan: * Hypoglycemia Type 2 diabetes on sulfonylureas Sepsis not suspected at this time even though patient presented with hypoglycemia related to sepsis back in May Patient reports poor oral intake due to recent UTI which could be etiology of hypoglycemia Might consider discontinuation of sulfonylureas Continue D5 with as needed D50 Close monitoring in stepdown with every hour fingersticks until sulfonylurea washout  Lactic acidosis Possibly related to metformin Lower suspicion for sepsis, as not quite meeting criteria Continue to monitor Hold metformin Acting improvement with hydration  Acute renal failure superimposed on stage 3a chronic kidney disease (HCC) Inadequate oral intake Creatinine 1.36 up from baseline Expecting improvement with IV hydration with D5  Urinary tract infection Was started on Bactrim 2 days prior for recurrent UTI.  Had Klebsiella UTI in May Received Rocephin in the ED Will hold off on antibiotics pending culture  Asthma without status asthmaticus Continue home inhalers.  Albuterol as needed  Acquired hypothyroidism Continue levothyroxine  Depression with anxiety Continue duloxetine and hydroxyzine  Hypokalemia KCl 40 mEq p.o. by mouth and monitor  Chronic pain syndrome Continue Cymbalta and lamotrigine    DVT prophylaxis: Lovenox  Consults: none  Advance Care Planning:   Code Status: Prior   Family Communication: none  Disposition Plan: Back to previous home environment  Severity of Illness: The appropriate patient status for this patient is INPATIENT. Inpatient status is judged to be reasonable and necessary in order to provide the required intensity of service to ensure the patient's safety. The patient's presenting symptoms, physical exam findings, and initial radiographic and laboratory data in the context of their chronic comorbidities is  felt to place them at high risk for further clinical deterioration. Furthermore, it is not anticipated that the patient will be medically stable for discharge from the hospital within 2 midnights of admission.   * I certify that at the point of admission it is my clinical judgment that the patient will require inpatient hospital care spanning beyond 2 midnights from the point of admission due to high intensity of service, high risk for further deterioration and high frequency of surveillance required.*  Author: Andris Baumann, MD 10/02/2022 11:42 PM  For on call review [email or web address].

## 2022-10-02 NOTE — Assessment & Plan Note (Signed)
Continue home inhalers Albuterol as needed 

## 2022-10-02 NOTE — Assessment & Plan Note (Signed)
Inadequate oral intake Creatinine 1.36 up from baseline Expecting improvement with IV hydration with D5

## 2022-10-02 NOTE — ED Triage Notes (Signed)
Pt presents to ER with c/o hypoglycemia that has been ongoing starting today.  Pt states she has hx of T2 diabetes and takes metformin at home.  Pt denies any recent n/v/d, and states she last ate some candy around 1800.  Recent UTI oer chart, and has been taking bactrim as prescribed.  Denies any LOC, but states she has been feeling weak and getting hot flashes.  Pt is otherwise A&O x4 and in NAD at this time.

## 2022-10-02 NOTE — Assessment & Plan Note (Addendum)
Type 2 diabetes on sulfonylureas Sepsis not suspected at this time even though patient presented with hypoglycemia related to sepsis back in May Patient reports poor oral intake due to recent UTI which could be etiology of hypoglycemia Might consider discontinuation of sulfonylureas Continue D5 with as needed D50 Close monitoring in stepdown with every hour fingersticks until sulfonylurea washout

## 2022-10-03 DIAGNOSIS — E162 Hypoglycemia, unspecified: Secondary | ICD-10-CM | POA: Diagnosis not present

## 2022-10-03 LAB — BASIC METABOLIC PANEL
Anion gap: 9 (ref 5–15)
BUN: 12 mg/dL (ref 8–23)
CO2: 26 mmol/L (ref 22–32)
Calcium: 9 mg/dL (ref 8.9–10.3)
Chloride: 97 mmol/L — ABNORMAL LOW (ref 98–111)
Creatinine, Ser: 1.18 mg/dL — ABNORMAL HIGH (ref 0.44–1.00)
GFR, Estimated: 52 mL/min — ABNORMAL LOW (ref >60)
Glucose, Bld: 97 mg/dL (ref 70–99)
Potassium: 4 mmol/L (ref 3.5–5.1)
Sodium: 132 mmol/L — ABNORMAL LOW (ref 135–145)

## 2022-10-03 LAB — URINALYSIS, ROUTINE W REFLEX MICROSCOPIC
Bilirubin Urine: NEGATIVE
Glucose, UA: 50 mg/dL — AB
Hgb urine dipstick: NEGATIVE
Ketones, ur: NEGATIVE mg/dL
Nitrite: NEGATIVE
Protein, ur: NEGATIVE mg/dL
Specific Gravity, Urine: 1.003 — ABNORMAL LOW (ref 1.005–1.030)
pH: 5 (ref 5.0–8.0)

## 2022-10-03 LAB — GLUCOSE, CAPILLARY
Glucose-Capillary: 145 mg/dL — ABNORMAL HIGH (ref 70–99)
Glucose-Capillary: 105 mg/dL — ABNORMAL HIGH (ref 70–99)
Glucose-Capillary: 79 mg/dL (ref 70–99)
Glucose-Capillary: 104 mg/dL — ABNORMAL HIGH (ref 70–99)
Glucose-Capillary: 117 mg/dL — ABNORMAL HIGH (ref 70–99)
Glucose-Capillary: 143 mg/dL — ABNORMAL HIGH (ref 70–99)
Glucose-Capillary: 142 mg/dL — ABNORMAL HIGH (ref 70–99)
Glucose-Capillary: 147 mg/dL — ABNORMAL HIGH (ref 70–99)
Glucose-Capillary: 187 mg/dL — ABNORMAL HIGH (ref 70–99)
Glucose-Capillary: 173 mg/dL — ABNORMAL HIGH (ref 70–99)
Glucose-Capillary: 129 mg/dL — ABNORMAL HIGH (ref 70–99)
Glucose-Capillary: 103 mg/dL — ABNORMAL HIGH (ref 70–99)
Glucose-Capillary: 163 mg/dL — ABNORMAL HIGH (ref 70–99)
Glucose-Capillary: 147 mg/dL — ABNORMAL HIGH (ref 70–99)
Glucose-Capillary: 132 mg/dL — ABNORMAL HIGH (ref 70–99)

## 2022-10-03 LAB — TROPONIN I (HIGH SENSITIVITY): Troponin I (High Sensitivity): 5 ng/L (ref <18)

## 2022-10-03 LAB — MRSA NEXT GEN BY PCR, NASAL: MRSA by PCR Next Gen: NOT DETECTED

## 2022-10-03 LAB — HEMOGLOBIN A1C
Hgb A1c MFr Bld: 5.5 % (ref 4.8–5.6)
Mean Plasma Glucose: 111.15 mg/dL

## 2022-10-03 LAB — CBG MONITORING, ED: Glucose-Capillary: 126 mg/dL — ABNORMAL HIGH (ref 70–99)

## 2022-10-03 LAB — HIV ANTIBODY (ROUTINE TESTING W REFLEX): HIV Screen 4th Generation wRfx: NONREACTIVE

## 2022-10-03 LAB — LACTIC ACID, PLASMA
Lactic Acid, Venous: 2.3 mmol/L (ref 0.5–1.9)
Lactic Acid, Venous: 1.2 mmol/L (ref 0.5–1.9)

## 2022-10-03 MED ORDER — SODIUM CHLORIDE 0.9 % IV SOLN
1.0000 g | INTRAVENOUS | Status: DC
Start: 2022-10-03 — End: 2022-10-03

## 2022-10-03 MED ORDER — KATE FARMS STANDARD 1.4 PO LIQD: 325.0000 mL | Freq: Three times a day (TID) | ORAL | Status: DC

## 2022-10-03 MED ORDER — THIAMINE HCL 100 MG PO TABS
100.0000 mg | ORAL_TABLET | Freq: Every day | ORAL | Status: DC
  Administered 2022-10-03: 100 mg via ORAL
  Filled 2022-10-03: qty 1

## 2022-10-03 MED ORDER — ALLOPURINOL 300 MG PO TABS
150.0000 mg | ORAL_TABLET | Freq: Every day | ORAL | Status: DC
  Administered 2022-10-03: 150 mg via ORAL
  Filled 2022-10-03: qty 1

## 2022-10-03 MED ORDER — ADULT MULTIVITAMIN W/MINERALS CH: 1.0000 | ORAL_TABLET | Freq: Every day | ORAL | Status: DC

## 2022-10-03 MED ORDER — CEPHALEXIN 500 MG PO CAPS: 500.0000 mg | ORAL_CAPSULE | Freq: Four times a day (QID) | ORAL | 0 refills | Status: DC

## 2022-10-03 MED ORDER — SODIUM CHLORIDE 0.9 % IV SOLN
1.0000 g | INTRAVENOUS | Status: DC
  Filled 2022-10-03: qty 10

## 2022-10-03 NOTE — TOC CM/SW Note (Signed)
Transition of Care [facility]) - Inpatient Brief Assessment   Patient Details  Name: Victoria Patterson MRN: [id number] Date of Birth: 1960-06-26  Transition of Care [facility]) CM/SW Contact:    Kreg Shropshire, RN Phone Number: 10/03/2022, 12:56 PM   Clinical Narrative: No needs at this time. Cm will continue to follow    Transition of Care Asessment: Insurance and Status: Insurance coverage has been reviewed Patient has primary care physician: Yes Home environment has been reviewed: Lives with spouse Prior level of function:: independent Prior/Current Home Services: No current home services Social Determinants of Health Reivew: SDOH reviewed no interventions necessary Readmission risk has been reviewed: Yes Transition of care needs: no transition of care needs at this time

## 2022-10-03 NOTE — Discharge Summary (Signed)
Victoria Patterson XBJ:[id number] DOB: [birth year] DOA: 10/02/2022  PCP: Marguarite Arbour, MD  Admit date: 10/02/2022 Discharge date: 10/03/2022  Time spent: 35 minutes  Recommendations for Outpatient Follow-up:  Pcp f/u     Discharge Diagnoses:  Principal Problem:   Hypoglycemia Active Problems:   UTI due to Klebsiella species   HTN (hypertension)   Lactic acidosis   Acute renal failure superimposed on stage 3a chronic kidney disease (HCC)   Inadequate oral intake   Asthma without status asthmaticus   Acquired hypothyroidism   Depression with anxiety   Obesity (BMI 30-39.9)   Chronic pain syndrome   Diabetes mellitus type 2, uncomplicated (HCC)   Hypokalemia   Discharge Condition: improved  Diet recommendation: regular  Filed Weights   10/02/22 2141 10/03/22 0200  Weight: 83 kg 82 kg    History of present illness:  From admission h and p Victoria Patterson is a 62 y.o. female with medical history significant for HTN, DM, asthma, depression, hypothyroidism, CKD 3 a, hospitalized in May 2024 with sepsis secondary to  UTI presenting with altered mental status, hypoglycemia and hypothermia with lactic acidosis, who presents to the ED With similar symptomatology of weakness and low blood sugars.  During the day her blood sugar was in the 50s and she kept eating to bring it up but it kept dropping.  She is on glimepiride and metformin.  She saw her PCP a few days prior and he started her on Bactrim for UTI.  Culture returned with Klebsiella, pansensitive except to ampicillin and nitrofurantoin.   She denies fever or chills or abdominal pain.  Has no nausea or vomiting, diarrhea.  Denies headache or visual disturbance or one-sided weakness numbness or tingling.   Hospital Course:  Patient presents with symptomatic hypoglycemia. She was recently diagnosed with a UTI by her PCP and started on bactrim. She is also on metformin and glimepiride at home. Here she was treated with d5 infusion and  frequent glucose checks. The d5 was stopped the morning of her discharge day and she was monitored throughout the day. Her glucose remained above 100 and she is tolerating a regular diet. Bactrim can precipitate hypoglycemia and so think that's the primary culprit. Advise holding her diabetes meds until pcp f/u. Bactrim will be held at discharge and will instead write for a course of keflex (she tolerated ceftriaxone here). Of note patient says she has lost a lot of weight recently by cutting out many foods in an attempt to avoid gout flares. Nothing seems to have helped. I advised her to liberalize her diet as this weight loss does not appear healthy. Advised close PCP f/u.   Procedures: none   Consultations: none  Discharge Exam: Vitals:   10/03/22 1400 10/03/22 1500  BP: 121/78 126/70  Pulse: (!) 56 (!) 53  Resp: 12 17  Temp:    SpO2: 99% 100%    General: NAD Cardiovascular: RRR Respiratory: CTAB  Discharge Instructions   Discharge Instructions     Diet - low sodium heart healthy   Complete by: As directed    Increase activity slowly   Complete by: As directed       Allergies as of 10/03/2022       Reactions   Penicillins Hives   Has patient had a PCN reaction causing immediate rash, facial/tongue/throat swelling, SOB or lightheadedness with hypotension: No Has patient had a PCN reaction causing severe rash involving mucus membranes or skin necrosis: Yes Has patient had  a PCN reaction that required hospitalization: No Has patient had a PCN reaction occurring within the last 10 years: No If all of the above answers are "NO", then may proceed with Cephalosporin use.   Stadol [butorphanol] Anxiety        Medication List     STOP taking these medications    colchicine 0.6 MG tablet   glimepiride 4 MG tablet Commonly known as: AMARYL   metFORMIN 500 MG tablet Commonly known as: GLUCOPHAGE       TAKE these medications    allopurinol 100 MG  tablet Commonly known as: ZYLOPRIM Take 150 mg by mouth daily.   bisoprolol-hydrochlorothiazide 5-6.25 MG tablet Commonly known as: ZIAC Take 1 tablet by mouth daily.   cephALEXin 500 MG capsule Commonly known as: KEFLEX Take 1 capsule (500 mg total) by mouth 4 (four) times daily for 4 days.   cetirizine 10 MG chewable tablet Commonly known as: ZYRTEC Chew 10 mg by mouth daily.   DULoxetine 30 MG capsule Commonly known as: CYMBALTA Take 1 capsule (30 mg total) by mouth daily.   ezetimibe 10 MG tablet Commonly known as: ZETIA Take 10 mg by mouth daily.   lamoTRIgine 200 MG tablet Commonly known as: LAMICTAL Take 1 tablet (200 mg total) by mouth daily.   levothyroxine 75 MCG tablet Commonly known as: SYNTHROID Take 75 mcg by mouth daily before breakfast.   ROLAIDS PO Take 1 tablet by mouth daily as needed (acid reflux).       Allergies  Allergen Reactions   Penicillins Hives    Has patient had a PCN reaction causing immediate rash, facial/tongue/throat swelling, SOB or lightheadedness with hypotension: No Has patient had a PCN reaction causing severe rash involving mucus membranes or skin necrosis: Yes Has patient had a PCN reaction that required hospitalization: No Has patient had a PCN reaction occurring within the last 10 years: No If all of the above answers are "NO", then may proceed with Cephalosporin use.    Stadol [Butorphanol] Anxiety    Follow-up Information     Marguarite Arbour, MD Follow up.   Specialty: Internal Medicine Contact information: [address] [facility] [place] [address] [phone]                  The results of significant diagnostics from this hospitalization (including imaging, microbiology, ancillary and laboratory) are listed below for reference.    Significant Diagnostic Studies: No results found.  Microbiology: Recent Results (from the past 240 hour(s))  MRSA Next Gen by PCR, Nasal      Status: None   Collection Time: 10/03/22  1:58 AM   Specimen: Nasal Mucosa; Nasal Swab  Result Value Ref Range Status   MRSA by PCR Next Gen NOT DETECTED NOT DETECTED Final    Comment: (NOTE) The GeneXpert MRSA Assay (FDA approved for NASAL specimens only), is one component of a comprehensive MRSA colonization surveillance program. It is not intended to diagnose MRSA infection nor to guide or monitor treatment for MRSA infections. Test performance is not FDA approved in patients less than 36 years old. Performed at [facility], [address] [place], [place] [address]      Labs: Basic Metabolic Panel: Recent Labs  Lab 10/02/22 2144 10/03/22 0435  NA 130* 132*  K 3.1* 4.0  CL 96* 97*  CO2 23 26  GLUCOSE 67* 97  BUN 13 12  CREATININE 1.36* 1.18*  CALCIUM 9.5 9.0   Liver  Function Tests: Recent Labs  Lab 10/02/22 2144  AST 19  ALT 15  ALKPHOS 69  BILITOT 0.3  PROT 7.5  ALBUMIN 4.5   No results for input(s): "LIPASE", "AMYLASE" in the last 168 hours. No results for input(s): "AMMONIA" in the last 168 hours. CBC: Recent Labs  Lab 10/02/22 2144  WBC 8.9  NEUTROABS 6.1  HGB 11.6*  HCT 33.7*  MCV 91.3  PLT 338   Cardiac Enzymes: No results for input(s): "CKTOTAL", "CKMB", "CKMBINDEX", "TROPONINI" in the last 168 hours. BNP: BNP (last 3 results) No results for input(s): "BNP" in the last 8760 hours.  ProBNP (last 3 results) No results for input(s): "PROBNP" in the last 8760 hours.  CBG: Recent Labs  Lab 10/03/22 1200 10/03/22 1258 10/03/22 1403 10/03/22 1500 10/03/22 1602  GLUCAP 129* 103* 163* 147* 132*       Signed:  Silvano Bilis MD.  [facility] 10/03/2022, 4:18 PM

## 2022-10-03 NOTE — Progress Notes (Addendum)
Initial Nutrition Assessment  DOCUMENTATION CODES:   Obesity unspecified  INTERVENTION:   Molli Posey Standard chocolate drink po TID- each supplement provides 330kcal and 16g protein  MVI po daily   Thiamine 100mg  po daily   Pt at high refeed risk; recommend monitor potassium, magnesium and phosphorus labs daily until stable  Daily weights   Vegetarian diet- no milk or eggs  Diet education   NUTRITION DIAGNOSIS:   Unintentional weight loss related to social / environmental circumstances as evidenced by 19 percent weight loss in 8 months.  GOAL:   Patient will meet greater than or equal to 90% of their needs  MONITOR:   PO intake, Supplement acceptance, Labs, Weight trends, I & O's, Skin  REASON FOR ASSESSMENT:   Malnutrition Screening Tool    ASSESSMENT:   62 y/o female with h/o DM, chronic pain, asthma, ADHD, bipolar disorder, OCD, depression, anxiety, CKD III, HTN, gout, hypothyroidism, GERD and recurrent UTI who is admitted with hypoglycemia, lactic acidosis, AKI and UTI.  Met with pt in room today. Pt reports decreased appetite and oral intake since March. Pt reports that in March, she suffered from a severe gout attack where both of her legs and feet were swollen. Pt reports that because of gout pain and fear of foods worsening her gout that she began to have poor oral intake. Pt reports that she was already a vegetarian since her 20's but reports that since March, she has eliminated many foods including milk and eggs and reports that she only eats ~8 foods now which she reports "is getting boring". Pt reports that she has lost ~37lbs since March. Per chart, pt appears to be down ~42lbs(19%) since November; this is significant weight loss. Per chart, pt did have gout in March affecting her right great toe and first MTP joint. Pt also appears to have had recurrent UTIs over the past several months also.   RD spoke with patient today regarding the importance of  adequate nutrition and protein intake needed to preserve lean muscle. Recommended for patient to try some soy based or pea protein based supplements/products so that she is able to obtain all of her essential amino acids needed on a vegetarian diet. Provided patient with some suggestions of some different foods and supplements that she can try. Pt would like to try chocolate Molli Posey here in hospital. RD will add supplements and MVI to help pt meet her estimated needs. Pt is likely at high refeed risk. Will initiate thiamine supplementation in setting of high refeed risk and noted lactic acidosis. May need to consider providing IV thiamine if lactic acid continues to increase.     Medications reviewed and include: allopurinol, lovenox, insulin, synthroid, ceftriaxone  Labs reviewed: Na 132(L), K 4.0 wnl, creat 1.18(H) Lactic acid- 2.3(H) Cbgs- 173, 187, 147, 142, 143, 117, 104, 79, 105 x 24 hrs  AIC- 5.5  NUTRITION - FOCUSED PHYSICAL EXAM:  Flowsheet Row Most Recent Value  Orbital Region No depletion  Upper Arm Region No depletion  Thoracic and Lumbar Region No depletion  Buccal Region No depletion  Temple Region No depletion  Clavicle Bone Region No depletion  Clavicle and Acromion Bone Region No depletion  Scapular Bone Region No depletion  Dorsal Hand No depletion  Patellar Region No depletion  Anterior Thigh Region No depletion  Posterior Calf Region No depletion  Edema (RD Assessment) Mild  Hair Reviewed  Eyes Reviewed  Mouth Reviewed  Skin Reviewed  Nails Reviewed  Diet Order:   Diet Order             Diet vegetarian Room service appropriate? Yes; Fluid consistency: Thin  Diet effective now                  EDUCATION NEEDS:   Education needs have been addressed  Skin:  Skin Assessment: Reviewed RN Assessment  Last BM:  7/19  Height:   Ht Readings from Last 1 Encounters:  10/03/22 5\' 3"  (1.6 m)    Weight:   Wt Readings from Last 1 Encounters:   10/03/22 82 kg    Ideal Body Weight:  52.2 kg  BMI:  Body mass index is 32.02 kg/m.  Estimated Nutritional Needs:   Kcal:  1700-1900kcal/day  Protein:  85-95g/day  Fluid:  1.6-1.8L/day  Betsey Holiday MS, RD, LDN Please refer to [facility] for RD and/or RD on-call/weekend/after hours pager

## 2022-10-03 NOTE — Inpatient Diabetes Management (Signed)
Inpatient Diabetes Program Recommendations  AACE/ADA: New Consensus Statement on Inpatient Glycemic Control   Target Ranges:  Prepandial:   less than 140 mg/dL      Peak postprandial:   less than 180 mg/dL (1-2 hours)      Critically ill patients:  140 - 180 mg/dL    Latest Reference Range & Units 10/03/22 00:41 10/03/22 01:57 10/03/22 03:04 10/03/22 04:01 10/03/22 04:55 10/03/22 06:03 10/03/22 07:00 10/03/22 07:58 10/03/22 09:11 10/03/22 09:55  Glucose-Capillary 70 - 99 mg/dL 621 (H) 308 (H) 657 (H) 79 104 (H) 117 (H) 143 (H) 142 (H) 147 (H) 187 (H)    Latest Reference Range & Units 10/02/22 21:44 10/03/22 00:29 10/03/22 04:35  Glucose 70 - 99 mg/dL 67 (L)  97  Hemoglobin A1C 4.8 - 5.6 %  5.5     Latest Reference Range & Units 10/02/22 21:32  Glucose-Capillary 70 - 99 mg/dL 42 (LL)   Review of Glycemic Control  Diabetes history: DM2 Outpatient Diabetes medications: Amaryl 2 mg QAM, Metformin 500 mg QAM Current orders for Inpatient glycemic control: Novolog 0-15 units TID with meals, Novolog 0-5 units QHS  Inpatient Diabetes Program Recommendations:    Outpatient DM medication: Given patient presented with hypoglycemia and A1C is 5.5% (indicating an average glucose of 111 mg/dl; prior Q4O 9.6% on 2/95/28 in Care Everywhere) would recommend discontinuing Amaryl outpatient and have patient follow up with PCP regarding DM control.  Thanks, Orlando Penner, RN, MSN, CDCES Diabetes Coordinator Inpatient Diabetes Program [phone] (Team Pager from 8am to 5pm)

## 2022-10-04 ENCOUNTER — Ambulatory Visit (INDEPENDENT_AMBULATORY_CARE_PROVIDER_SITE_OTHER): Payer: 59 | Admitting: Urology

## 2022-10-04 ENCOUNTER — Encounter: Payer: Self-pay | Admitting: Urology

## 2022-10-04 VITALS — BP 99/66 | HR 80 | Ht 65.0 in | Wt 180.0 lb

## 2022-10-04 DIAGNOSIS — Z87440 Personal history of urinary (tract) infections: Secondary | ICD-10-CM | POA: Diagnosis not present

## 2022-10-04 DIAGNOSIS — N390 Urinary tract infection, site not specified: Secondary | ICD-10-CM | POA: Diagnosis not present

## 2022-10-04 DIAGNOSIS — Z09 Encounter for follow-up examination after completed treatment for conditions other than malignant neoplasm: Secondary | ICD-10-CM | POA: Diagnosis not present

## 2022-10-04 LAB — URINALYSIS, COMPLETE
Bilirubin, UA: NEGATIVE
Glucose, UA: NEGATIVE
Ketones, UA: NEGATIVE
Nitrite, UA: NEGATIVE
Protein,UA: NEGATIVE
Specific Gravity, UA: 1.015 (ref 1.005–1.030)
Urobilinogen, Ur: 0.2 mg/dL (ref 0.2–1.0)
pH, UA: 7 (ref 5.0–7.5)

## 2022-10-04 LAB — MICROSCOPIC EXAMINATION: Epithelial Cells (non renal): >10 /hpf — AB (ref 0–10)

## 2022-10-04 MED ORDER — ESTRADIOL 0.1 MG/GM VA CREA: TOPICAL_CREAM | VAGINAL | 12 refills | Status: DC

## 2022-10-04 MED ORDER — CEPHALEXIN 500 MG PO CAPS: 500.0000 mg | ORAL_CAPSULE | Freq: Every day | ORAL | 3 refills | Status: DC

## 2022-10-04 NOTE — Patient Instructions (Signed)
Cranberry and D mannose

## 2022-10-04 NOTE — Progress Notes (Signed)
I, Duke Salvia, acting as a Neurosurgeon for Riki Altes, MD., have documented all relevant documentation on the behalf of Riki Altes, MD, as directed by  Riki Altes, MD while in the presence of Riki Altes, MD.   10/04/2022 4:12 PM   Victoria Patterson 1960/10/18 [id number]  Referring provider: Marguarite Arbour, MD [address] [facility] [place],  [place] [address]  Chief Complaint  Patient presents with   Recurrent UTI    HPI: Victoria Patterson is a 62 y.o. female referred for evaluation of recurrent UTIs.  Recently diagnosed with Klebsiella UTI and was started on Bactrim and subsequently hospitalized due to hypoglycemia, which she states was thought was secondary to the Bactrim. Hospitalized May 2024 for suspected UTI with sepsis, however, her urine culture was negative. Urinalysis also showed no significant pyuria. She did have a positive urine culture for Klebsiella 09/28/2022.  She has had several urinalyses, which have shown pyuria, however, there has been significant vaginal contamination with up to 78 squamous epithelial cells. She states her UTI symptoms are primarily urinary frequency and cloudy urine. It was felt her episode in May was due to UTI with altered mental status. She is presently asymptomatic and on Cephalexin.   PMH: Past Medical History:  Diagnosis Date   ADHD (attention deficit hyperactivity disorder)    Anxiety    Arthritis    Asthma    controlled for many years   Depression    Diabetes mellitus, type II (HCC)    Fatty liver    Hypertension    Hypothyroidism    Obsessive-compulsive disorder     Surgical History: Past Surgical History:  Procedure Laterality Date   BREAST CYST ASPIRATION Left 1992   neg   CHOLECYSTECTOMY     dilation and evacuation     x2   HYSTEROSCOPY WITH D & C N/A 02/04/2018   Procedure: DILATATION AND CURETTAGE Melton Krebs, fractional;  Surgeon: Schermerhorn, Ihor Austin, MD;  Location: [facility]  ORS;  Service: Gynecology;  Laterality: N/A;    Home Medications:  Allergies as of 10/04/2022       Reactions   Bactrim [sulfamethoxazole-trimethoprim] Other (See Comments)   hypoglycemia   Penicillins Hives   Has patient had a PCN reaction causing immediate rash, facial/tongue/throat swelling, SOB or lightheadedness with hypotension: No Has patient had a PCN reaction causing severe rash involving mucus membranes or skin necrosis: Yes Has patient had a PCN reaction that required hospitalization: No Has patient had a PCN reaction occurring within the last 10 years: No If all of the above answers are "NO", then may proceed with Cephalosporin use.   Stadol [butorphanol] Anxiety        Medication List        Accurate as of October 04, 2022  4:12 PM. If you have any questions, ask your nurse or doctor.          allopurinol 100 MG tablet Commonly known as: ZYLOPRIM Take 150 mg by mouth daily.   bisoprolol-hydrochlorothiazide 5-6.25 MG tablet Commonly known as: ZIAC Take 1 tablet by mouth daily.   cephALEXin 500 MG capsule Commonly known as: KEFLEX Take 1 capsule (500 mg total) by mouth daily. What changed: when to take this Changed by: Riki Altes   cetirizine 10 MG chewable tablet Commonly known as: ZYRTEC Chew 10 mg by mouth daily.   DULoxetine 30 MG capsule Commonly known as: CYMBALTA Take 1 capsule (30 mg total) by  mouth daily.   estradiol 0.1 MG/GM vaginal cream Commonly known as: ESTRACE Pea size amount on vaginal area  twice a week Started by: Riki Altes   ezetimibe 10 MG tablet Commonly known as: ZETIA Take 10 mg by mouth daily.   lamoTRIgine 200 MG tablet Commonly known as: LAMICTAL Take 1 tablet (200 mg total) by mouth daily.   levothyroxine 75 MCG tablet Commonly known as: SYNTHROID Take 75 mcg by mouth daily before breakfast.   ROLAIDS PO Take 1 tablet by mouth daily as needed (acid reflux).        Allergies:  Allergies   Allergen Reactions   Bactrim [Sulfamethoxazole-Trimethoprim] Other (See Comments)    hypoglycemia   Penicillins Hives    Has patient had a PCN reaction causing immediate rash, facial/tongue/throat swelling, SOB or lightheadedness with hypotension: No Has patient had a PCN reaction causing severe rash involving mucus membranes or skin necrosis: Yes Has patient had a PCN reaction that required hospitalization: No Has patient had a PCN reaction occurring within the last 10 years: No If all of the above answers are "NO", then may proceed with Cephalosporin use.    Stadol [Butorphanol] Anxiety    Family History: Family History  Problem Relation Age of Onset   Ovarian cancer Mother 41   Bipolar disorder Mother    Breast cancer Paternal Aunt 64   Breast cancer Cousin        maternal   Alcohol abuse Father     Social History:  reports that she has never smoked. She has never used smokeless tobacco. She reports that she does not drink alcohol and does not use drugs.   Physical Exam: BP 99/66   Pulse 80   Ht 5\' 5"  (1.651 m)   Wt 180 lb (81.6 kg)   BMI 29.95 kg/m   Constitutional:  Alert and oriented, No acute distress. HEENT: [place] AT Respiratory: Normal respiratory effort, no increased work of breathing. Psychiatric: Normal mood and affect.  Laboratory Data: Dipstick trace blood 2+leukocytes/microscopy 11-30 WBC, however >10 epis.   Assessment & Plan:    1. Urinary tract infection Does not meet AUA criteria for diagnosis of rcurrent UTIs in women secondary to lack of culture documentation. The majority of her urinalysis reviewed have significant epithelial contamination. We did discuss recurrent urinary tract infections in postmenopausal women are common and preventative options were reviewed, including low dose vaginal estrogen cream, supplements of cranberry/D-mannose and low dose antibiotic prophylaxis. She was interested in pursuing low dose vaginal estrogen and RX sent to  pharmacy. Low dose antibiotic suppression with Cephalaxin 500 mg daily x3 months after she completes her current antibiotic course. She was also given information on cranberry and D-mannose. Instructed to call our office for recurrent UTI symptoms and follow up in 3 months for recheck.  I have reviewed the above documentation for accuracy and completeness, and I agree with the above.   Riki Altes, MD  [facility] [address] [place], [place] [address] [phone]

## 2022-10-05 ENCOUNTER — Encounter: Payer: Self-pay | Admitting: Urology

## 2022-10-06 DIAGNOSIS — N390 Urinary tract infection, site not specified: Secondary | ICD-10-CM | POA: Diagnosis not present

## 2022-10-06 DIAGNOSIS — I10 Essential (primary) hypertension: Secondary | ICD-10-CM | POA: Diagnosis not present

## 2022-10-06 DIAGNOSIS — H6120 Impacted cerumen, unspecified ear: Secondary | ICD-10-CM | POA: Diagnosis not present

## 2022-10-06 DIAGNOSIS — Z8739 Personal history of other diseases of the musculoskeletal system and connective tissue: Secondary | ICD-10-CM | POA: Diagnosis not present

## 2022-10-06 DIAGNOSIS — E118 Type 2 diabetes mellitus with unspecified complications: Secondary | ICD-10-CM | POA: Diagnosis not present

## 2022-10-06 NOTE — Progress Notes (Deleted)
BH MD/PA/NP OP Progress Note  10/06/2022 2:24 PM Victoria Patterson  MRN:  [id number]  Chief Complaint: No chief complaint on file.  HPI:  According to the chart review, the following events have occurred since the last visit: The patient presented to ED a few times, which includes for UTI, hypglycemia related to Bactim.   Visit Diagnosis: No diagnosis found.  Past Psychiatric History: Please see initial evaluation for full details. I have reviewed the history. No updates at this time.     Past Medical History:  Past Medical History:  Diagnosis Date   ADHD (attention deficit hyperactivity disorder)    Anxiety    Arthritis    Asthma    controlled for many years   Depression    Diabetes mellitus, type II (HCC)    Fatty liver    Hypertension    Hypothyroidism    Obsessive-compulsive disorder     Past Surgical History:  Procedure Laterality Date   BREAST CYST ASPIRATION Left 1992   neg   CHOLECYSTECTOMY     dilation and evacuation     x2   HYSTEROSCOPY WITH D & C N/A 02/04/2018   Procedure: DILATATION AND CURETTAGE Melton Krebs, fractional;  Surgeon: Schermerhorn, Ihor Austin, MD;  Location: [facility] ORS;  Service: Gynecology;  Laterality: N/A;    Family Psychiatric History: Please see initial evaluation for full details. I have reviewed the history. No updates at this time.     Family History:  Family History  Problem Relation Age of Onset   Ovarian cancer Mother 80   Bipolar disorder Mother    Breast cancer Paternal Aunt 64   Breast cancer Cousin        maternal   Alcohol abuse Father     Social History:  Social History   Socioeconomic History   Marital status: Married    Spouse name: michael   Number of children: 1   Years of education: Not on file   Highest education level: Master's degree (e.g., MA, MS, MEng, MEd, MSW, MBA)  Occupational History   Not on file  Tobacco Use   Smoking status: Never   Smokeless tobacco: Never  Vaping Use   Vaping status: Never  Used  Substance and Sexual Activity   Alcohol use: No   Drug use: No   Sexual activity: Not Currently  Other Topics Concern   Not on file  Social History Narrative   Not on file   Social Determinants of Health   Financial Resource Strain: Patient Declined (08/31/2022)   Received from [facility]   Overall Financial Resource Strain (CARDIA)    Difficulty of Paying Living Expenses: Patient declined  Food Insecurity: No Food Insecurity (10/03/2022)   Hunger Vital Sign    Worried About Running Out of Food in the Last Year: Never true    Ran Out of Food in the Last Year: Never true  Transportation Needs: No Transportation Needs (10/03/2022)   PRAPARE - Administrator, Civil Service (Medical): No    Lack of Transportation (Non-Medical): No  Physical Activity: Not on file  Stress: Not on file  Social Connections: Not on file    Allergies:  Allergies  Allergen Reactions   Bactrim [Sulfamethoxazole-Trimethoprim] Other (See Comments)    hypoglycemia   Penicillins Hives    Has patient had a PCN reaction causing immediate rash, facial/tongue/throat swelling, SOB or lightheadedness with hypotension: No Has patient had a PCN reaction causing severe  rash involving mucus membranes or skin necrosis: Yes Has patient had a PCN reaction that required hospitalization: No Has patient had a PCN reaction occurring within the last 10 years: No If all of the above answers are "NO", then may proceed with Cephalosporin use.    Stadol [Butorphanol] Anxiety    Metabolic Disorder Labs: Lab Results  Component Value Date   HGBA1C 5.5 10/03/2022   MPG 111.15 10/03/2022   No results found for: "PROLACTIN" No results found for: "CHOL", "TRIG", "HDL", "CHOLHDL", "VLDL", "LDLCALC" No results found for: "TSH"  Therapeutic Level Labs: No results found for: "LITHIUM" No results found for: "VALPROATE" No results found for: "CBMZ"  Current  Medications: Current Outpatient Medications  Medication Sig Dispense Refill   allopurinol (ZYLOPRIM) 100 MG tablet Take 150 mg by mouth daily.     bisoprolol-hydrochlorothiazide (ZIAC) 5-6.25 MG tablet Take 1 tablet by mouth daily.     Ca Carbonate-Mag Hydroxide (ROLAIDS PO) Take 1 tablet by mouth daily as needed (acid reflux).     cephALEXin (KEFLEX) 500 MG capsule Take 1 capsule (500 mg total) by mouth daily. 30 capsule 3   cetirizine (ZYRTEC) 10 MG chewable tablet Chew 10 mg by mouth daily.     DULoxetine (CYMBALTA) 30 MG capsule Take 1 capsule (30 mg total) by mouth daily. 30 capsule 0   estradiol (ESTRACE) 0.1 MG/GM vaginal cream Pea size amount on vaginal area  twice a week 42.5 g 12   ezetimibe (ZETIA) 10 MG tablet Take 10 mg by mouth daily.     lamoTRIgine (LAMICTAL) 200 MG tablet Take 1 tablet (200 mg total) by mouth daily. 90 tablet 1   levothyroxine (SYNTHROID, LEVOTHROID) 75 MCG tablet Take 75 mcg by mouth daily before breakfast.     No current facility-administered medications for this visit.     Musculoskeletal: Strength & Muscle Tone: within normal limits Gait & Station: normal Patient leans: N/A  Psychiatric Specialty Exam: Review of Systems  There were no vitals taken for this visit.There is no height or weight on file to calculate BMI.  General Appearance: {Appearance:22683}  Eye Contact:  {BHH EYE CONTACT:22684}  Speech:  Clear and Coherent  Volume:  Normal  Mood:  {BHH MOOD:22306}  Affect:  {Affect (PAA):22687}  Thought Process:  Coherent  Orientation:  Full (Time, Place, and Person)  Thought Content: Logical   Suicidal Thoughts:  {ST/HT (PAA):22692}  Homicidal Thoughts:  {ST/HT (PAA):22692}  Memory:  Immediate;   Good  Judgement:  {Judgement (PAA):22694}  Insight:  {Insight (PAA):22695}  Psychomotor Activity:  Normal  Concentration:  Concentration: Good and Attention Span: Good  Recall:  Good  Fund of Knowledge: Good  Language: Good  Akathisia:  No   Handed:  Right  AIMS (if indicated): not done  Assets:  Communication Skills Desire for Improvement  ADL's:  Intact  Cognition: WNL  Sleep:  {BHH GOOD/FAIR/POOR:22877}   Screenings: GAD-7    Loss adjuster, chartered Office Visit from 06/08/2022 in [facility] Office Visit from 04/10/2022 in [facility] Office Visit from 02/09/2022 in [facility] Office Visit from 12/13/2021 in [facility] Office Visit from 10/31/2021 in [facility]  Total GAD-7 Score 14 15 16 14 14       PHQ2-9    Flowsheet Row Office Visit from 06/08/2022 in [facility] Office Visit from 04/10/2022 in [facility] Office Visit from 02/09/2022 in [facility] Office Visit from 12/13/2021 in [facility] Office Visit from 10/31/2021 in [facility]  PHQ-2 Total Score 5 4 5 4 5   PHQ-9 Total Score 16 19 16 17 17       Flowsheet Row ED to Hosp-Admission (Discharged) from 10/02/2022 in [facility] ICU/CCU ED to Hosp-Admission (Discharged) from 07/26/2022 in [facility] Emergency Department at [facility]  C-SSRS RISK CATEGORY No Risk No Risk        Assessment and Plan:  Victoria Patterson is a 62 y.o. year old female with a history of bipolar II disorder, OCD (germ phobia), ADHD, history of eating disorder both anorexia and bulimia,  hypertension, diabetes, hypothyroidism. bilateral primary. The patient presents for follow up appointment for below.     1. Obsessive-compulsive disorder, unspecified type 2. Bipolar II disorder (HCC) Acute stressors include:  financial strain Other stressors include: unemployment since  Sept 2022 (on disability), absence of nurturing from her mother/financial strain when she was a child     History: seen by Dr. Maryruth Bun for many years, subthreshold hypomanic symptoms in response to her husband's infidelity She continues to experience the oppressive symptoms and the OCD symptoms (repetitive handwashing due to germ phobia, and obsessive thoughts, being worried about her son).  Although it has been advised over the past several months to consider switching to venlafaxine, she has not been able to do so due to fear of adverse reaction.  Will continue current dose of duloxetine for OCD, depression.  Will continue lamotrigine for bipolar disorder.  She is strongly encouraged to continue to see her therapist.    3. Insomnia, unspecified type She continues to report snoring, daytime fatigue and middle insomnia.  Although she is concerned that she may not be able to use CPAP machine, she agrees to at least contact the office for possible available options in her evaluation/treatment.    Plan Continue duloxetine 60 mg daily (although venlafaxine uptitration was ordered to the pharmacy in the past, she has not started this.) Continue lamotrigine 200 mg daily Referred for evaluation of sleep apnea Next appointment: 6/20 at 11 AM for 30 mins, in person - She will see Lyda Perone for therapy      Past trials of medication: fluoxetine, sertraline, lexapro, Vraylar, Ability,   The patient demonstrates the following risk factors for suicide: Chronic risk factors for suicide include: psychiatric disorder of OCD, bipolar disorder . Acute risk factors for suicide include: unemployment. Protective factors for this patient include: positive social support, responsibility to others (children, family), coping skills, and hope for the future. Considering these factors, the overall suicide risk at this point appears to be low. Patient is appropriate for outpatient follow up.   Collaboration of Care:  Collaboration of Care: {BH OP Collaboration of Care:21014065}  Patient/Guardian was advised Release of Information must be obtained prior to any record release in order to collaborate their care with an outside provider. Patient/Guardian was advised if they have not already done so to contact the registration department to sign all necessary forms in order for [place] to release information regarding their care.   Consent: Patient/Guardian gives verbal consent for treatment and assignment of benefits for services provided during this visit. Patient/Guardian expressed understanding and agreed to proceed.    Neysa Hotter, MD 10/06/2022, 2:24 PM

## 2022-10-09 ENCOUNTER — Ambulatory Visit: Payer: 59 | Admitting: Psychiatry

## 2022-10-10 ENCOUNTER — Ambulatory Visit: Payer: 59 | Admitting: Psychiatry

## 2022-10-10 ENCOUNTER — Encounter: Payer: Self-pay | Admitting: Psychiatry

## 2022-10-10 VITALS — BP 126/76 | HR 66 | Temp 97.6°F | Ht 65.0 in | Wt 179.8 lb

## 2022-10-10 DIAGNOSIS — F3181 Bipolar II disorder: Secondary | ICD-10-CM | POA: Diagnosis not present

## 2022-10-10 DIAGNOSIS — F429 Obsessive-compulsive disorder, unspecified: Secondary | ICD-10-CM

## 2022-10-10 DIAGNOSIS — Z03818 Encounter for observation for suspected exposure to other biological agents ruled out: Secondary | ICD-10-CM | POA: Diagnosis not present

## 2022-10-10 DIAGNOSIS — G4700 Insomnia, unspecified: Secondary | ICD-10-CM

## 2022-10-10 DIAGNOSIS — F422 Mixed obsessional thoughts and acts: Secondary | ICD-10-CM | POA: Diagnosis not present

## 2022-10-10 MED ORDER — DULOXETINE HCL 30 MG PO CPEP: 30.0000 mg | ORAL_CAPSULE | Freq: Every day | ORAL | 0 refills | Status: DC

## 2022-10-10 MED ORDER — LAMOTRIGINE 200 MG PO TABS: 200.0000 mg | ORAL_TABLET | Freq: Every day | ORAL | 1 refills | Status: DC
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
## 2022-10-17 DIAGNOSIS — J329 Chronic sinusitis, unspecified: Secondary | ICD-10-CM | POA: Diagnosis not present

## 2022-10-18 DIAGNOSIS — H93291 Other abnormal auditory perceptions, right ear: Secondary | ICD-10-CM | POA: Diagnosis not present

## 2022-10-18 DIAGNOSIS — H6121 Impacted cerumen, right ear: Secondary | ICD-10-CM | POA: Diagnosis not present

## 2022-10-24 DIAGNOSIS — F422 Mixed obsessional thoughts and acts: Secondary | ICD-10-CM | POA: Diagnosis not present

## 2022-10-25 DIAGNOSIS — Z79899 Other long term (current) drug therapy: Secondary | ICD-10-CM | POA: Diagnosis not present

## 2022-10-25 DIAGNOSIS — M1A0790 Idiopathic chronic gout, unspecified ankle and foot, without tophus (tophi): Secondary | ICD-10-CM | POA: Diagnosis not present

## 2022-10-31 ENCOUNTER — Telehealth: Payer: Self-pay | Admitting: Urology

## 2022-10-31 NOTE — Telephone Encounter (Signed)
Pt called office asking if she could get something different instead of Estrace.  She thinks it may be causing itching and burning.  Also, pt said she has hives and thinks it may be from Cephalaxin.  Please give her a call.

## 2022-11-03 DIAGNOSIS — R350 Frequency of micturition: Secondary | ICD-10-CM | POA: Diagnosis not present

## 2022-11-05 ENCOUNTER — Emergency Department: Payer: 59

## 2022-11-05 ENCOUNTER — Other Ambulatory Visit: Payer: Self-pay

## 2022-11-05 ENCOUNTER — Emergency Department
Admission: EM | Admit: 2022-11-05 | Discharge: 2022-11-05 | Disposition: A | Discharge status: Home / Self Care | Payer: 59 | Attending: Emergency Medicine | Admitting: Emergency Medicine

## 2022-11-05 DIAGNOSIS — F419 Anxiety disorder, unspecified: Secondary | ICD-10-CM | POA: Insufficient documentation

## 2022-11-05 DIAGNOSIS — N1831 Chronic kidney disease, stage 3a: Secondary | ICD-10-CM | POA: Insufficient documentation

## 2022-11-05 DIAGNOSIS — I129 Hypertensive chronic kidney disease with stage 1 through stage 4 chronic kidney disease, or unspecified chronic kidney disease: Secondary | ICD-10-CM | POA: Diagnosis not present

## 2022-11-05 DIAGNOSIS — E039 Hypothyroidism, unspecified: Secondary | ICD-10-CM | POA: Diagnosis not present

## 2022-11-05 DIAGNOSIS — M7989 Other specified soft tissue disorders: Secondary | ICD-10-CM | POA: Diagnosis not present

## 2022-11-05 DIAGNOSIS — R519 Headache, unspecified: Secondary | ICD-10-CM | POA: Diagnosis not present

## 2022-11-05 DIAGNOSIS — E1122 Type 2 diabetes mellitus with diabetic chronic kidney disease: Secondary | ICD-10-CM | POA: Insufficient documentation

## 2022-11-05 DIAGNOSIS — J45909 Unspecified asthma, uncomplicated: Secondary | ICD-10-CM | POA: Insufficient documentation

## 2022-11-05 DIAGNOSIS — I10 Essential (primary) hypertension: Secondary | ICD-10-CM | POA: Diagnosis not present

## 2022-11-05 DIAGNOSIS — R9082 White matter disease, unspecified: Secondary | ICD-10-CM | POA: Diagnosis not present

## 2022-11-05 DIAGNOSIS — R0602 Shortness of breath: Secondary | ICD-10-CM | POA: Diagnosis not present

## 2022-11-05 DIAGNOSIS — R4182 Altered mental status, unspecified: Secondary | ICD-10-CM | POA: Diagnosis not present

## 2022-11-05 LAB — BASIC METABOLIC PANEL
Anion gap: 13 (ref 5–15)
BUN: 19 mg/dL (ref 8–23)
CO2: 26 mmol/L (ref 22–32)
Calcium: 9.7 mg/dL (ref 8.9–10.3)
Chloride: 96 mmol/L — ABNORMAL LOW (ref 98–111)
Creatinine, Ser: 1.25 mg/dL — ABNORMAL HIGH (ref 0.44–1.00)
GFR, Estimated: 49 mL/min — ABNORMAL LOW (ref >60)
Glucose, Bld: 123 mg/dL — ABNORMAL HIGH (ref 70–99)
Potassium: 3.7 mmol/L (ref 3.5–5.1)
Sodium: 135 mmol/L (ref 135–145)

## 2022-11-05 LAB — CBC
HCT: 34.4 % — ABNORMAL LOW (ref 36.0–46.0)
Hemoglobin: 11 g/dL — ABNORMAL LOW (ref 12.0–15.0)
MCH: 31.1 pg (ref 26.0–34.0)
MCHC: 32 g/dL (ref 30.0–36.0)
MCV: 97.2 fL (ref 80.0–100.0)
Platelets: 341 10*3/uL (ref 150–400)
RBC: 3.54 MIL/uL — ABNORMAL LOW (ref 3.87–5.11)
RDW: 13.6 % (ref 11.5–15.5)
WBC: 6.4 10*3/uL (ref 4.0–10.5)
nRBC: 0 % (ref 0.0–0.2)

## 2022-11-05 LAB — TROPONIN I (HIGH SENSITIVITY): Troponin I (High Sensitivity): 7 ng/L (ref <18)

## 2022-11-05 NOTE — ED Provider Notes (Signed)
[facility] Provider Note    Event Date/Time   First MD Initiated Contact with Patient 11/05/22 0116     (approximate)   History   Hypertension   HPI  Victoria Patterson is a 62 y.o. female who presents to the ED from home with a chief complaint of elevated blood pressure.  Patient with a history of hypertension, diabetes, not on antihypertensives who reports elevated blood pressure tonight.  Baseline blood pressures 100s/70s.  She has been dealing with a UTI, first on Keflex and urologist switched her to Cipro this weekend.  She was reading the package insert on the Cipro which prompted her to take her blood pressure although she was asymptomatic at the time.  Blood pressure was 180 systolic which made patient nervous and then she felt uncomfortable with mild headache.  Noted pedal edema tonight and mild shortness of breath on exertion.  Denies fever/chills, chest pain, abdominal pain, nausea, vomiting or dizziness.     Past Medical History   Past Medical History:  Diagnosis Date   ADHD (attention deficit hyperactivity disorder)    Anxiety    Arthritis    Asthma    controlled for many years   Depression    Diabetes mellitus, type II (HCC)    Fatty liver    Hypertension    Hypothyroidism    Obsessive-compulsive disorder      Active Problem List   Patient Active Problem List   Diagnosis Date Noted   Inadequate oral intake 10/02/2022   Hypokalemia 10/02/2022   Lactic acidosis 10/02/2022   Acute idiopathic gout of foot 09/01/2022   Severe sepsis (HCC) 07/26/2022   UTI due to Klebsiella species 07/26/2022   Depression with anxiety 07/26/2022   Obesity (BMI 30-39.9) 07/26/2022   Type II diabetes mellitus with renal manifestations (HCC) 07/26/2022   Acute renal failure superimposed on stage 3a chronic kidney disease (HCC) 07/26/2022   Hypoglycemia 07/26/2022   Hypothermia 07/26/2022   Hyponatremia 07/26/2022   Acute metabolic encephalopathy  07/26/2022   Hypophosphatemia 07/26/2022   HTN (hypertension) 07/26/2022   Bilateral primary osteoarthritis of knee 11/09/2017   Chronic pain of both knees 11/09/2017   Chronic pain syndrome 11/09/2017   Arthralgia of both knees 11/09/2017   Acquired hypothyroidism 11/09/2017   ADD (attention deficit disorder) 11/09/2017   Asthma without status asthmaticus 11/09/2017   Depression 11/09/2017   Diabetes mellitus type 2, uncomplicated (HCC) 11/09/2017     Past Surgical History   Past Surgical History:  Procedure Laterality Date   BREAST CYST ASPIRATION Left 1992   neg   CHOLECYSTECTOMY     dilation and evacuation     x2   HYSTEROSCOPY WITH D & C N/A 02/04/2018   Procedure: DILATATION AND CURETTAGE Melton Krebs, fractional;  Surgeon: Schermerhorn, Ihor Austin, MD;  Location: [facility];  Service: Gynecology;  Laterality: N/A;     Home Medications   Prior to Admission medications   Medication Sig Start Date End Date Taking? Authorizing Provider  allopurinol (ZYLOPRIM) 100 MG tablet Take 150 mg by mouth daily. 07/14/22 07/14/23  [provider]  DULoxetine (CYMBALTA) 30 MG capsule Take 1 capsule (30 mg total) by mouth daily. 10/18/22 01/16/23  Neysa Hotter, MD  estradiol (ESTRACE) 0.1 MG/GM vaginal cream Pea size amount on vaginal area  twice a week 10/04/22   Stoioff, Verna Czech, MD  ezetimibe (ZETIA) 10 MG tablet Take 10 mg by mouth daily.    [provider]  lamoTRIgine (LAMICTAL)  200 MG tablet Take 1 tablet (200 mg total) by mouth daily. 10/10/22 04/08/23  Neysa Hotter, MD  levothyroxine (SYNTHROID, LEVOTHROID) 75 MCG tablet Take 75 mcg by mouth daily before breakfast.    [provider]     Allergies  Bactrim [sulfamethoxazole-trimethoprim], Penicillins, and Stadol [butorphanol]   Family History   Family History  Problem Relation Age of Onset   Ovarian cancer Mother 72   Bipolar disorder Mother    Breast cancer Paternal Aunt 5   Breast cancer Cousin         maternal   Alcohol abuse Father      Physical Exam  Triage Vital Signs: ED Triage Vitals  Encounter Vitals Group     BP 11/05/22 0047 (!) 180/89     Systolic BP Percentile --      Diastolic BP Percentile --      Pulse Rate 11/05/22 0047 78     Resp 11/05/22 0047 18     Temp 11/05/22 0047 98.4 F (36.9 C)     Temp Source 11/05/22 0047 Oral     SpO2 11/05/22 0047 100 %     Weight 11/05/22 0049 178 lb (80.7 kg)     Height 11/05/22 0049 5\' 2"  (1.575 m)     Head Circumference --      Peak Flow --      Pain Score 11/05/22 0046 0     Pain Loc --      Pain Education --      Exclude from Growth Chart --     Updated Vital Signs: BP (!) 143/74   Pulse (!) 55   Temp 98.4 F (36.9 C) (Oral)   Resp 16   Ht 5\' 2"  (1.575 m)   Wt 80.7 kg   SpO2 100%   BMI 32.56 kg/m    General: Awake, no distress.  Mildly anxious. CV:  RRR.  Good peripheral perfusion.  Resp:  Normal effort.  CTAB. Abd:  Nontender.  No distention.  Other:  No carotid bruits.  Alert and oriented x 3.  CN II-XII grossly intact.  5/5 motor strength and sensation all extremities.  MAE x 4.   ED Results / Procedures / Treatments  Labs (all labs ordered are listed, but only abnormal results are displayed) Labs Reviewed  CBC - Abnormal; Notable for the following components:      Result Value   RBC 3.54 (*)    Hemoglobin 11.0 (*)    HCT 34.4 (*)    All other components within normal limits  BASIC METABOLIC PANEL - Abnormal; Notable for the following components:   Chloride 96 (*)    Glucose, Bld 123 (*)    Creatinine, Ser 1.25 (*)    GFR, Estimated 49 (*)    All other components within normal limits  TROPONIN I (HIGH SENSITIVITY)     EKG  ED ECG REPORT I, Tradarius Reinwald J, the attending physician, personally viewed and interpreted this ECG.   Date: 11/05/2022  EKG Time: 0057  Rate: 67  Rhythm: normal sinus rhythm  Axis: Normal  Intervals:none  ST&T Change: Nonspecific    RADIOLOGY I have  independently visualized and interpreted patient's imaging studies as well as noted the radiology interpretation:  Chest x-ray: No acute cardiopulmonary process  CT head: No ICH  Official radiology report(s): CT Head Wo Contrast  Result Date: 11/05/2022 CLINICAL DATA:  Mental status changes. Unknown cause. Currently being treated for urinary tract infection. Presented with hypertension and leg  swelling. EXAM: CT HEAD WITHOUT CONTRAST TECHNIQUE: Contiguous axial images were obtained from the base of the skull through the vertex without intravenous contrast. RADIATION DOSE REDUCTION: This exam was performed according to the departmental dose-optimization program which includes automated exposure control, adjustment of the mA and/or kV according to patient size and/or use of iterative reconstruction technique. COMPARISON:  Head CT 07/26/2022 FINDINGS: Brain: No evidence of acute infarction, hemorrhage, hydrocephalus, extra-axial collection or mass lesion/mass effect. There is mild cerebral cortical atrophy with normal volume of the cerebellum and brainstem. Early small-vessel disease of the cerebral white matter. The cerebellar tonsils are low lying but not meeting the criteria for Chiari type 1 malformation. Vascular: There are patchy calcifications of the carotid siphons. There are no hyperdense central vessels. Skull: Negative for fractures or focal lesions. Sinuses/Orbits: No acute finding. Old bilateral maxillary sinus antrostomies. Mild S shaped nasal septum. Negative visible orbits. Other: None. Electronically Signed   By: Almira Bar M.D.   On: 11/05/2022 01:56   DG Chest 2 View  Result Date: 11/05/2022 CLINICAL DATA:  Shortness of breath, leg swelling EXAM: CHEST - 2 VIEW COMPARISON:  07/26/2022 FINDINGS: The heart size and mediastinal contours are within normal limits. Both lungs are clear. The visualized skeletal structures are unremarkable. IMPRESSION: No active cardiopulmonary disease.  Electronically Signed   By: Charlett Nose M.D.   On: 11/05/2022 01:30     PROCEDURES:  Critical Care performed: No  .1-3 Lead EKG Interpretation  Performed by: Irean Hong, MD Authorized by: Irean Hong, MD     Interpretation: normal     ECG rate:  70   ECG rate assessment: normal     Rhythm: sinus rhythm     Ectopy: none     Conduction: normal   Comments:     Patient placed on cardiac monitor to evaluate for arrhythmias    MEDICATIONS ORDERED IN ED: Medications - No data to display   IMPRESSION / MDM / ASSESSMENT AND PLAN / ED COURSE  I reviewed the triage vital signs and the nursing notes.                             62 year old female presenting with elevated blood pressure, currently being treated with 2 antibiotics for UTI.  Differential diagnosis includes but is not limited to ICH, CVA, ACS, infectious, metabolic etiologies, etc.  I have personally reviewed patient's records and note her GYN office visits for urinary frequency on 11/03/2022.  Patient's presentation is most consistent with acute illness / injury with system symptoms.  The patient is on the cardiac monitor to evaluate for evidence of arrhythmia and/or significant heart rate changes.  Laboratory results demonstrate normal WBC 6.4, mild increase in creatinine compared to prior.  X-ray is clear.  Will add troponin, CT head and reassess.  Clinical Course as of 11/05/22 0340  Sun Nov 05, 2022  0340 Blood pressure improved.  Updated patient and spouse on laboratory results.  Recommend she continue to follow her blood pressures twice daily and follow-up with her PCP.  Strict return precautions given.  Both verbalized understanding and agree with plan of care. [JS]    Clinical Course User Index [JS] Irean Hong, MD     FINAL CLINICAL IMPRESSION(S) / ED DIAGNOSES   Final diagnoses:  Hypertension, unspecified type     Rx / DC Orders   ED Discharge Orders     None  Note:  This document  was prepared using Dragon voice recognition software and may include unintentional dictation errors.   Irean Hong, MD 11/05/22 [phone]

## 2022-11-05 NOTE — ED Triage Notes (Addendum)
Pt reports she was concerned tonight that her blood pressure was elevated 180/92. Pt reports she takes her blood pressure medication as prescribed. Mild headache earlier. Currently being treated for UTI. Leg swelling tonight and SOB when walking around.

## 2022-11-05 NOTE — Discharge Instructions (Signed)
Continue to check your blood pressures twice daily.  Return to the ER for worsening symptoms, persistent vomiting, chest pain, shortness of breath or other concerns.

## 2022-11-06 ENCOUNTER — Ambulatory Visit: Payer: Self-pay | Admitting: Urology

## 2022-11-07 ENCOUNTER — Telehealth: Payer: Self-pay

## 2022-11-07 DIAGNOSIS — I10 Essential (primary) hypertension: Secondary | ICD-10-CM

## 2022-11-07 DIAGNOSIS — N760 Acute vaginitis: Secondary | ICD-10-CM | POA: Diagnosis not present

## 2022-11-08 ENCOUNTER — Telehealth: Payer: Self-pay | Admitting: *Deleted

## 2022-11-08 NOTE — Progress Notes (Signed)
  Care Coordination   Note   11/08/2022 Name: DARRA JANOSKY MRN: [id number] DOB: 07/31/1960  Gifford Shave Lader is a 62 y.o. year old female who sees Sparks, Duane Lope, MD for primary care. I reached out to Nevena A Liberto by phone today to offer care coordination services.  Ms. Eull was given information about Care Coordination services today including:   The Care Coordination services include support from the care team which includes your Nurse Coordinator, Clinical Social Worker, or Pharmacist.  The Care Coordination team is here to help remove barriers to the health concerns and goals most important to you. Care Coordination services are voluntary, and the patient may decline or stop services at any time by request to their care team member.   Care Coordination Consent Status: Patient agreed to services and verbal consent obtained.   Follow up plan:  Telephone appointment with care coordination team member scheduled for:  11/10/2022  Encounter Outcome:  Pt. Scheduled from referral   Burman Nieves, [facility] Care Coordination Care Guide Direct Dial: [phone]

## 2022-11-08 NOTE — Progress Notes (Signed)
11/09/2022 10:18 AM   Victoria Patterson A Negro Jan 23, 1961 [id number]  Referring provider: Marguarite Arbour, MD [address] [facility] [place],  [place] [address]  Urological history: 1. Cystitis -contributing factors of age, GSM, diabetes, obesity and constipation -Documented urine cultures over the last year  November 03, 2022 <10,000  September 28, 2022 - Klebsiella pneumoniae  Aug 10, 2022 - mixed urogenital flora  Jul 26, 2022 - no growth  Jul 14, 2022 - mixed urogenital flora -Vaginal estrogen cream and suppressive antibiotics for 3 months   Chief Complaint  Patient presents with   urinary issues    HPI: Victoria Patterson is a 62 y.o. female who presents today for burning with urination.   Previous records reviewed.  She was seen by her gynecologist recently for vaginal itching and burning.  Her wet prep was negative and her pelvic exam was benign.  She was also having hives and thinks it may be related to the Cephalexin.    She is having 1-7 daytime voids, 3 or more episodes of nocturia with a mild urge to urinate during the day and a strong need to urge during the night.  She has urge incontinence and incontinence after urinating and wiping.  She leaks 1-2 times a day.  She does not wear absorbent products for urinary leakage.  She does not limit fluid intake.  She does engage in toilet mapping some times.  She is having symptoms of dysuria, frequency, urge incontinence and post-void dribbling.  She had vaginal irritation with the vaginal estrogen cream.  She is taking the Keflex daily.  She had a severe allergic reaction to Cipro.  She states her last 2 urine cultures, 1 with her PCP and 1 with her gynecologist, were negative for infection.  She feels a cephalexin just caused her to itch after she was in the sun for period of time.  UA yellow cloudy, specific gravity 1.020, trace heme, pH 6.0, trace protein, 3+ leukocyte, greater than 30 WBCs, 3-10 RBCs, greater than 10  epithelial cells, mucus threads are present and many bacteria.  PVR 34 mL   PMH: Past Medical History:  Diagnosis Date   ADHD (attention deficit hyperactivity disorder)    Anxiety    Arthritis    Asthma    controlled for many years   Depression    Diabetes mellitus, type II (HCC)    Fatty liver    Hypertension    Hypothyroidism    Obsessive-compulsive disorder     Surgical History: Past Surgical History:  Procedure Laterality Date   BREAST CYST ASPIRATION Left 1992   neg   CHOLECYSTECTOMY     dilation and evacuation     x2   HYSTEROSCOPY WITH D & C N/A 02/04/2018   Procedure: DILATATION AND CURETTAGE Melton Krebs, fractional;  Surgeon: Schermerhorn, Ihor Austin, MD;  Location: [facility] ORS;  Service: Gynecology;  Laterality: N/A;    Home Medications:  Allergies as of 11/09/2022       Reactions   Bactrim [sulfamethoxazole-trimethoprim] Other (See Comments)   hypoglycemia   Ciprofloxacin Other (See Comments)   She experienced hypertension and pedal edema which caused her to seek treatment in the ED.   Penicillins Hives   Has patient had a PCN reaction causing immediate rash, facial/tongue/throat swelling, SOB or lightheadedness with hypotension: No Has patient had a PCN reaction causing severe rash involving mucus membranes or skin necrosis: Yes Has patient had a PCN reaction that required hospitalization: No  Has patient had a PCN reaction occurring within the last 10 years: No If all of the above answers are "NO", then may proceed with Cephalosporin use.   Stadol [butorphanol] Anxiety        Medication List        Accurate as of November 09, 2022 10:18 AM. If you have any questions, ask your nurse or doctor.          allopurinol 100 MG tablet Commonly known as: ZYLOPRIM Take 150 mg by mouth daily.   DULoxetine 30 MG capsule Commonly known as: CYMBALTA Take 1 capsule (30 mg total) by mouth daily.   estradiol 0.1 MG/GM vaginal cream Commonly known as:  ESTRACE Pea size amount on vaginal area  twice a week   ezetimibe 10 MG tablet Commonly known as: ZETIA Take 10 mg by mouth daily.   lamoTRIgine 200 MG tablet Commonly known as: LAMICTAL Take 1 tablet (200 mg total) by mouth daily.   levothyroxine 75 MCG tablet Commonly known as: SYNTHROID Take 75 mcg by mouth daily before breakfast.        Allergies:  Allergies  Allergen Reactions   Bactrim [Sulfamethoxazole-Trimethoprim] Other (See Comments)    hypoglycemia   Ciprofloxacin Other (See Comments)    She experienced hypertension and pedal edema which caused her to seek treatment in the ED.   Penicillins Hives    Has patient had a PCN reaction causing immediate rash, facial/tongue/throat swelling, SOB or lightheadedness with hypotension: No Has patient had a PCN reaction causing severe rash involving mucus membranes or skin necrosis: Yes Has patient had a PCN reaction that required hospitalization: No Has patient had a PCN reaction occurring within the last 10 years: No If all of the above answers are "NO", then may proceed with Cephalosporin use.    Stadol [Butorphanol] Anxiety    Family History: Family History  Problem Relation Age of Onset   Ovarian cancer Mother 41   Bipolar disorder Mother    Breast cancer Paternal Aunt 15   Breast cancer Cousin        maternal   Alcohol abuse Father     Social History:  reports that she has never smoked. She has never used smokeless tobacco. She reports that she does not drink alcohol and does not use drugs.  ROS: Pertinent ROS in HPI  Physical Exam: BP (!) 89/59   Pulse 61   Ht 5\' 3"  (1.6 m)   Wt 178 lb (80.7 kg)   BMI 31.53 kg/m   Constitutional:  Well nourished. Alert and oriented, No acute distress. HEENT: [place] AT, moist mucus membranes.  Trachea midline, no masses. Cardiovascular: No clubbing, cyanosis, or edema. Respiratory: Normal respiratory effort, no increased work of breathing. GU: No CVA tenderness.  No  bladder fullness or masses.  Recession of labia minora, adhesion of the lower labia, dry, pale vulvar vaginal mucosa and loss of mucosal ridges and folds.  Normal urethral meatus, two polyps are located on either side of the vagina.  They are non tender and no discharge was elicit.  No bladder fullness, tenderness or masses. Pale vagina mucosa, poor estrogen effect, no discharge, no lesions, poor pelvic support, grade II cystocele and no rectocele noted. Anus and perineum are without rashes or lesions.    Neurologic: Grossly intact, no focal deficits, moving all 4 extremities. Psychiatric: Normal mood and affect.    Laboratory Data: Lab Results  Component Value Date   WBC 6.4 11/05/2022   HGB 11.0 (L)  11/05/2022   HCT 34.4 (L) 11/05/2022   MCV 97.2 11/05/2022   PLT 341 11/05/2022   Lab Results  Component Value Date   CREATININE 1.25 (H) 11/05/2022   Lab Results  Component Value Date   HGBA1C 5.5 10/03/2022   Lab Results  Component Value Date   AST 19 10/02/2022   Lab Results  Component Value Date   ALT 15 10/02/2022   Urinalysis Results for orders placed or performed in visit on 11/09/22  CULTURE, URINE COMPREHENSIVE   Specimen: Urine   UR  Result Value Ref Range   Urine Culture, Comprehensive Final report    Organism ID, Bacteria Comment   Mycoplasma / ureaplasma culture   Specimen: Genital   UR  Result Value Ref Range   Ureaplasma urealyticum Comment Negative   Mycoplasma hominis Culture Comment Negative  Microscopic Examination   Urine  Result Value Ref Range   WBC, UA >30 (A) 0 - 5 /hpf   RBC, Urine 3-10 (A) 0 - 2 /hpf   Epithelial Cells (non renal) >10 (A) 0 - 10 /hpf   Mucus, UA Present (A) Not Estab.   Bacteria, UA Many (A) None seen/Few  Urinalysis, Complete  Result Value Ref Range   Specific Gravity, UA 1.020 1.005 - 1.030   pH, UA 6.0 5.0 - 7.5   Color, UA Yellow Yellow   Appearance Ur Cloudy (A) Clear   Leukocytes,UA 3+ (A) Negative   Protein,UA  Trace (A) Negative/Trace   Glucose, UA Negative Negative   Ketones, UA Negative Negative   RBC, UA Trace (A) Negative   Bilirubin, UA Negative Negative   Urobilinogen, Ur 0.2 0.2 - 1.0 mg/dL   Nitrite, UA Negative Negative   Microscopic Examination See below:   Bladder Scan (Post Void Residual) in office  Result Value Ref Range   Scan Result 34ml     I have reviewed the labs.   Pertinent Imaging:  11/09/22 08:37  Scan Result 34ml    Assessment & Plan:    1. Cystitis -UA with pyuria, hematuria and bacteria -urine culture pending -atypical's pending  2. Vaginal atrophy -Explained that consistent use of vaginal estrogen cream after several months has been shown to reduce GSM symptoms and UTIs significantly -She is going to send me a photograph of her current vaginal cream so we can prescribe a different brand to see if she can tolerate it better  3. Cystocele -Explained that this may be contributing to her postvoid dribbling -Recommend Kegel exercises  4. Microscopic hematuria -Explained that on urinalysis she had microscopic hematuria and if her urine cultures are negative we will need to pursue a hematuria workup which would consist of a CT urogram and cystoscopy.  The reason we want to do this is to rule out things that can cause blood in the urine such as kidney/bladder cancer, kidney stones, infection and anatomical abnormalities  Return for pending urine cutlure results .  These notes generated with voice recognition software. I apologize for typographical errors.  Cloretta Ned  [facility] [address]  [address] [place], [place] [address] [phone]

## 2022-11-08 NOTE — Progress Notes (Signed)
  Care Coordination  Outreach Note  11/08/2022 Name: Victoria Patterson MRN: [id number] DOB: 05-Sep-1960   Care Coordination Outreach Attempts: An unsuccessful telephone outreach was attempted today to offer the patient information about available care coordination services.  Follow Up Plan:  Additional outreach attempts will be made to offer the patient care coordination information and services.   Encounter Outcome:  No Answer  Burman Nieves, CCMA Care Coordination Care Guide Direct Dial: [phone]

## 2022-11-09 ENCOUNTER — Ambulatory Visit (INDEPENDENT_AMBULATORY_CARE_PROVIDER_SITE_OTHER): Payer: 59 | Admitting: Urology

## 2022-11-09 ENCOUNTER — Encounter: Payer: Self-pay | Admitting: Urology

## 2022-11-09 VITALS — BP 89/59 | HR 61 | Ht 63.0 in | Wt 178.0 lb

## 2022-11-09 DIAGNOSIS — R3129 Other microscopic hematuria: Secondary | ICD-10-CM | POA: Diagnosis not present

## 2022-11-09 DIAGNOSIS — N3020 Other chronic cystitis without hematuria: Secondary | ICD-10-CM

## 2022-11-09 DIAGNOSIS — N8111 Cystocele, midline: Secondary | ICD-10-CM

## 2022-11-09 DIAGNOSIS — N952 Postmenopausal atrophic vaginitis: Secondary | ICD-10-CM

## 2022-11-09 DIAGNOSIS — Z79899 Other long term (current) drug therapy: Secondary | ICD-10-CM | POA: Diagnosis not present

## 2022-11-09 LAB — URINALYSIS, COMPLETE
Bilirubin, UA: NEGATIVE
Glucose, UA: NEGATIVE
Ketones, UA: NEGATIVE
Nitrite, UA: NEGATIVE
Specific Gravity, UA: 1.02 (ref 1.005–1.030)
Urobilinogen, Ur: 0.2 mg/dL (ref 0.2–1.0)
pH, UA: 6 (ref 5.0–7.5)

## 2022-11-09 LAB — MICROSCOPIC EXAMINATION
Epithelial Cells (non renal): >10 /hpf — AB (ref 0–10)
WBC, UA: >30 /hpf — AB (ref 0–5)

## 2022-11-09 LAB — BLADDER SCAN AMB NON-IMAGING

## 2022-11-10 ENCOUNTER — Ambulatory Visit: Payer: Self-pay | Admitting: *Deleted

## 2022-11-10 NOTE — Patient Outreach (Signed)
  Care Coordination   11/10/2022 Name: Victoria Patterson MRN: [id number] DOB: [birth year]   Care Coordination Outreach Attempts:  An unsuccessful telephone outreach was attempted for a scheduled appointment today.  Follow Up Plan:  Additional outreach attempts will be made to offer the patient care coordination information and services.   Encounter Outcome:  No Answer   Care Coordination Interventions:  No, not indicated    Kemper Durie, RN, MSN, [facility] Care Management Care Management Coordinator [phone]

## 2022-11-12 LAB — CULTURE, URINE COMPREHENSIVE

## 2022-11-14 ENCOUNTER — Other Ambulatory Visit: Payer: Self-pay | Admitting: Urology

## 2022-11-14 DIAGNOSIS — N952 Postmenopausal atrophic vaginitis: Secondary | ICD-10-CM

## 2022-11-14 MED ORDER — PREMARIN 0.625 MG/GM VA CREA
TOPICAL_CREAM | VAGINAL | 12 refills | Status: AC
Start: 2022-11-14 — End: ?

## 2022-11-15 ENCOUNTER — Telehealth: Payer: Self-pay | Admitting: *Deleted

## 2022-11-15 NOTE — Progress Notes (Signed)
  Care Coordination Note  11/15/2022 Name: SHAKIRIA PERL MRN: [id number] DOB: 1960/06/12  [name] is a 62 y.o. year old female who is a primary care patient of Judithann Sheen, Duane Lope, MD and is actively engaged with the care management team. I reached out to Sharlisa A Overall by phone today to assist with re-scheduling an initial visit with the RN Case Manager  Follow up plan: We have been unable to make contact with the patient for follow up.   Burman Nieves, CCMA Care Coordination Care Guide Direct Dial: [phone]

## 2022-11-16 ENCOUNTER — Other Ambulatory Visit: Payer: Self-pay | Admitting: Urology

## 2022-11-16 DIAGNOSIS — R3129 Other microscopic hematuria: Secondary | ICD-10-CM

## 2022-11-16 LAB — MYCOPLASMA / UREAPLASMA CULTURE
Mycoplasma hominis Culture: NEGATIVE
Ureaplasma urealyticum: NEGATIVE

## 2022-11-21 DIAGNOSIS — F422 Mixed obsessional thoughts and acts: Secondary | ICD-10-CM | POA: Diagnosis not present

## 2022-11-23 ENCOUNTER — Telehealth: Payer: Self-pay | Admitting: Urology

## 2022-11-23 NOTE — Telephone Encounter (Signed)
Patient called and requested a call back from Shannon's medical assistant to discuss some concerns she has about getting the scan, and also if scan is with or without contrast. She had sent a request through mychart to schedule an appointment with Carollee Herter prior to her CT on 11/28/22. Since Carollee Herter is on vacation; was unable to schedule appointment this week, and Monday 9/16 schedule is full.

## 2022-11-24 ENCOUNTER — Ambulatory Visit: Payer: 59

## 2022-11-24 NOTE — Telephone Encounter (Signed)
Pt calling asking if the CT is with or without contrast, I advised that it is without.

## 2022-11-24 NOTE — Telephone Encounter (Signed)
Marland Kitchen  left message to have patient return my call.

## 2022-11-27 ENCOUNTER — Ambulatory Visit
Admission: RE | Admit: 2022-11-27 | Discharge: 2022-11-27 | Disposition: A | Discharge status: Home / Self Care | Payer: 59 | Source: Ambulatory Visit | Attending: Urology | Admitting: Urology

## 2022-11-27 DIAGNOSIS — R3129 Other microscopic hematuria: Secondary | ICD-10-CM | POA: Insufficient documentation

## 2022-11-27 DIAGNOSIS — R319 Hematuria, unspecified: Secondary | ICD-10-CM | POA: Diagnosis not present

## 2022-11-27 DIAGNOSIS — Z9049 Acquired absence of other specified parts of digestive tract: Secondary | ICD-10-CM | POA: Diagnosis not present

## 2022-11-27 MED ORDER — IOHEXOL 300 MG/ML  SOLN
100.0000 mL | Freq: Once | INTRAMUSCULAR | Status: AC | PRN
  Administered 2022-11-27: 100 mL via INTRAVENOUS

## 2022-11-28 ENCOUNTER — Ambulatory Visit: Payer: 59

## 2022-11-28 ENCOUNTER — Ambulatory Visit: Payer: 59 | Admitting: Dietician

## 2022-11-29 DIAGNOSIS — H60331 Swimmer's ear, right ear: Secondary | ICD-10-CM | POA: Diagnosis not present

## 2022-11-29 DIAGNOSIS — H6121 Impacted cerumen, right ear: Secondary | ICD-10-CM | POA: Diagnosis not present

## 2022-11-29 DIAGNOSIS — H900 Conductive hearing loss, bilateral: Secondary | ICD-10-CM | POA: Diagnosis not present

## 2022-12-01 ENCOUNTER — Telehealth: Payer: Self-pay | Admitting: *Deleted

## 2022-12-01 DIAGNOSIS — E118 Type 2 diabetes mellitus with unspecified complications: Secondary | ICD-10-CM | POA: Diagnosis not present

## 2022-12-01 DIAGNOSIS — L659 Nonscarring hair loss, unspecified: Secondary | ICD-10-CM | POA: Diagnosis not present

## 2022-12-01 DIAGNOSIS — I10 Essential (primary) hypertension: Secondary | ICD-10-CM | POA: Diagnosis not present

## 2022-12-01 DIAGNOSIS — H6121 Impacted cerumen, right ear: Secondary | ICD-10-CM | POA: Diagnosis not present

## 2022-12-01 DIAGNOSIS — Z8739 Personal history of other diseases of the musculoskeletal system and connective tissue: Secondary | ICD-10-CM | POA: Diagnosis not present

## 2022-12-01 NOTE — Progress Notes (Signed)
Care Coordination Note  12/01/2022 Name: Victoria Patterson MRN: [id number] DOB: [birth year]  [name] is a 62 y.o. year old female who is a primary care patient of Sparks, Duane Lope, MD and is actively engaged with the care management team. I reached out to Mariacristina A Lumbra by phone today to assist with re-scheduling an initial visit with the RN Case Manager  Follow up plan: Telephone appointment with care management team member scheduled for: 12/27/2022  Burman Nieves, [facility] Care Coordination Care Guide Direct Dial: [phone]

## 2022-12-05 ENCOUNTER — Ambulatory Visit: Payer: 59

## 2022-12-05 DIAGNOSIS — F422 Mixed obsessional thoughts and acts: Secondary | ICD-10-CM | POA: Diagnosis not present

## 2022-12-06 ENCOUNTER — Encounter: Payer: Self-pay | Admitting: Urology

## 2022-12-08 DIAGNOSIS — H6121 Impacted cerumen, right ear: Secondary | ICD-10-CM | POA: Diagnosis not present

## 2022-12-08 DIAGNOSIS — J301 Allergic rhinitis due to pollen: Secondary | ICD-10-CM | POA: Diagnosis not present

## 2022-12-08 DIAGNOSIS — H6061 Unspecified chronic otitis externa, right ear: Secondary | ICD-10-CM | POA: Diagnosis not present

## 2022-12-14 ENCOUNTER — Other Ambulatory Visit: Payer: 59 | Admitting: Urology

## 2022-12-18 ENCOUNTER — Encounter: Payer: Self-pay | Admitting: Urology

## 2022-12-18 ENCOUNTER — Ambulatory Visit (INDEPENDENT_AMBULATORY_CARE_PROVIDER_SITE_OTHER): Payer: 59 | Admitting: Urology

## 2022-12-18 VITALS — BP 94/58 | HR 65 | Ht 63.0 in | Wt 170.0 lb

## 2022-12-18 DIAGNOSIS — H6121 Impacted cerumen, right ear: Secondary | ICD-10-CM | POA: Diagnosis not present

## 2022-12-18 DIAGNOSIS — R3129 Other microscopic hematuria: Secondary | ICD-10-CM | POA: Diagnosis not present

## 2022-12-18 DIAGNOSIS — H6061 Unspecified chronic otitis externa, right ear: Secondary | ICD-10-CM | POA: Diagnosis not present

## 2022-12-18 DIAGNOSIS — J300 Vasomotor rhinitis: Secondary | ICD-10-CM | POA: Diagnosis not present

## 2022-12-18 DIAGNOSIS — H9011 Conductive hearing loss, unilateral, right ear, with unrestricted hearing on the contralateral side: Secondary | ICD-10-CM | POA: Diagnosis not present

## 2022-12-18 MED ORDER — GEMTESA 75 MG PO TABS: 75.0000 mg | ORAL_TABLET | Freq: Every day | ORAL | Status: DC

## 2022-12-18 NOTE — Progress Notes (Unsigned)
   12/18/22  CC:  Chief Complaint  Patient presents with   Cysto    HPI: Refer to [facility]'s note 11/09/2022.  CTU performed 11/27/2022 showed no upper tract abnormalities.  UA today micro >30 WBC/>10 epis  Blood pressure (!) 94/58, pulse 65, height 5\' 3"  (1.6 m), weight 170 lb (77.1 kg). NED. A&Ox3.   No respiratory distress   Abd soft, NT, ND Normal external genitalia with patent urethral meatus  Cystoscopy Procedure Note  Patient identification was confirmed, informed consent was obtained, and patient was prepped using Betadine solution.  Lidocaine jelly was administered per urethral meatus.    Procedure: - Flexible cystoscope introduced, without any difficulty.   - Thorough search of the bladder revealed:    normal urethral meatus    normal urothelium    no stones    no ulcers     no tumors    no urethral polyps    no trabeculation  - Ureteral orifices were normal in position and appearance.  Post-Procedure: - Patient tolerated the procedure well  Assessment/ Plan: Upon cystoscope insertion urine was aspirated and sent for repeat urinalysis as I suspect the majority of her pyuria is secondary to vaginal contamination Trial Gemtesa 75 mg daily and follow-up with Carollee Herter in 1 month for symptom reassessment    Riki Altes, MD

## 2022-12-19 ENCOUNTER — Ambulatory Visit (INDEPENDENT_AMBULATORY_CARE_PROVIDER_SITE_OTHER): Payer: 59 | Admitting: Dermatology

## 2022-12-19 ENCOUNTER — Encounter: Payer: Self-pay | Admitting: Dermatology

## 2022-12-19 DIAGNOSIS — L658 Other specified nonscarring hair loss: Secondary | ICD-10-CM

## 2022-12-19 DIAGNOSIS — L650 Telogen effluvium: Secondary | ICD-10-CM

## 2022-12-19 LAB — MICROSCOPIC EXAMINATION

## 2022-12-19 LAB — URINALYSIS, COMPLETE
Bilirubin, UA: NEGATIVE
Glucose, UA: NEGATIVE
Ketones, UA: NEGATIVE
Leukocytes,UA: NEGATIVE
Nitrite, UA: NEGATIVE
Protein,UA: NEGATIVE
Specific Gravity, UA: >1.03 — ABNORMAL HIGH (ref 1.005–1.030)
Urobilinogen, Ur: 0.2 mg/dL (ref 0.2–1.0)
pH, UA: 6 (ref 5.0–7.5)

## 2022-12-19 NOTE — Patient Instructions (Addendum)

## 2022-12-19 NOTE — Progress Notes (Signed)
   New Patient Visit   Subjective  Victoria Patterson is a 62 y.o. female who presents for the following: Patient c/o hair loss for ~ 3 months, she lost ~50 pound in since March when she was diagnosed with gout. Patient report she had Telogen Effluvium hair loss in the past due to weight loss she would stave her self to lose weight.   The patient has spots, moles and lesions to be evaluated, some may be new or changing and the patient may have concern these could be cancer.   The following portions of the chart were reviewed this encounter and updated as appropriate: medications, allergies, medical history  Review of Systems:  No other skin or systemic complaints except as noted in HPI or Assessment and Plan.  Objective  Well appearing patient in no apparent distress; mood and affect are within normal limits.  A focused examination was performed of the following areas:face, scalp    Relevant exam findings are noted in the Assessment and Plan.    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 No scaling crusting scarring on scalp  Telogen effluvium is a benign, self-limited condition causing increased hair shedding usually for several months. It does not progress to baldness, and the hair eventually grows back on its own. It can be triggered by recent illness, recent surgery, thyroid disease, low iron stores, vitamin D deficiency, fad diets or rapid weight loss, hormonal changes such as pregnancy or birth control pills, and some medication. Usually the hair loss starts 2-3 months after the illness or health change. Rarely, it can continue for longer than a year.   Treatment Plan: Recommend consult with PCP about rapid weight loss, may continue to see hair loss as patient continue to lose   weight.  Hair will eventually normalize when weight loss is under control and nutrition is adequate.  Recommend consult with a dietician; patient already has a referral and can call for an appointment If not improved after diet and weight loss are rectified, return for follow up Do not recommend starting minoxidil given that problem is primarily diet-related and minoxidil could induce a telogen effluvium-like shedding event. Could add minoxidil if diet is fixed and hair growth does not improve Start otc Iron supplements daily or seek out vegetables with high iron content given vegetarian diet Patient is on thyroid replacement therapy  Return if symptoms worsen or fail to improve.  IAngelique Holm, CMA, am acting as scribe for Elie Goody, MD .   Documentation: I have reviewed the above documentation for accuracy and completeness, and I agree with the above.  Elie Goody, MD

## 2022-12-20 ENCOUNTER — Encounter: Payer: Self-pay | Admitting: Urology

## 2022-12-26 ENCOUNTER — Encounter: Payer: Self-pay | Admitting: Dietician

## 2022-12-26 ENCOUNTER — Encounter: Payer: 59 | Attending: Physician Assistant | Admitting: Dietician

## 2022-12-26 DIAGNOSIS — I10 Essential (primary) hypertension: Secondary | ICD-10-CM | POA: Diagnosis not present

## 2022-12-26 DIAGNOSIS — E118 Type 2 diabetes mellitus with unspecified complications: Secondary | ICD-10-CM | POA: Diagnosis not present

## 2022-12-26 DIAGNOSIS — E119 Type 2 diabetes mellitus without complications: Secondary | ICD-10-CM

## 2022-12-26 DIAGNOSIS — Z713 Dietary counseling and surveillance: Secondary | ICD-10-CM | POA: Insufficient documentation

## 2022-12-26 NOTE — Progress Notes (Signed)
Medical Nutrition Therapy  Appointment Start time:  [phone]  Appointment End time:  1030  Primary concerns today: Weight Loss, Gout flares  Referral diagnosis: I10 - HTN, E11.8 - T2DM Preferred learning style: No preference indicated Learning readiness: Ready   NUTRITION ASSESSMENT    Clinical Medical Hx: T2DM, HTN, Gout, Weight loss, UTIs Medications: Allopurinol, Duloxetine, Ezetimibe, Levothyroxine Labs: Reviewed Notable Signs/Symptoms: Slight muscle wasting (interosseous, deltoid), ridges in fingernails, hair loss  Lifestyle & Dietary Hx Pt reports being hospitalized with severe gout in May, states it was a traumatic experience. Pt reports losing ~50 lbs. since May, stopped eating due to fear of pain in feet a/w gout, pt states their hair has been falling out as well since losing weight. Pt reports struggling with weight since adolescence, history of using starvation diets to lose weight since teenage years. Pt reports previously taking glimepiride and metformin, no previous side effects, hospitalized for hypoglycemia in July as a result of them just not eating due to fear of gout flare up, doctor d/c all DM medications after hospitalization. Pt reports that they are vegetarian, plant based except for dairy, previously would eat a lot of breads and sweets. Pt reports eating the same thing every day now, not sure what else they can eat that won't cause gout.   Estimated daily fluid intake: 48-64 oz Supplements: N/A Sleep: No issues Stress / self-care: Mild Current average weekly physical activity: ADLs, Knee pain hinders most activity  24-Hr Dietary Recall First Meal: American cheese on a hot dog bun x2, 1/2 cup grape juice Snack:  Second Meal: Mixed nuts, fruit (Apples, grapes) Snack:  Third Meal: American cheese on a hot dog bun x2, broccoli, 1/2 cup grape juice Snack:  Beverages: Water w/ lemon   NUTRITION DIAGNOSIS  [place]-3.2 Unintentional weight loss As related to  restrictive eating.  As evidenced by weight loss for 50 lbs x 5 months, fear of eating, muscle wasting.   NUTRITION INTERVENTION  Nutrition education (E-1) on the following topics:  Educated patient on importance of adequate protein intake daily. Educated patient on signs/symptoms of insufficient protein intake (hair loss, grooves in nails, recurrent infections, weakness, muscle wasting, etc.). Educated patient on sources of plant-based protein, as well as appropriate high protein ONS. Educated patient on importance of whole grains, and whole fruits to maximize nutrition with sources of carbs.   Handouts Provided Include  Low Purine Nutrition Therapy Power Plate Fruits list   Learning Style & Readiness for Change Teaching method utilized: Visual & Auditory  Demonstrated degree of understanding via: Teach Back  Barriers to learning/adherence to lifestyle change: Fear of gout pain   Goals Established by Pt Look into Premier Protein brand supplements and choose flavors that you like. Have 1 carton EVERY day to increase your protein intake! This will help to rebuild lean mass and boost your immune system. Begin taking a daily prenatal multivitamin supplement. Switch to WHOLE wheat bread and a cheddar cheese in place of the hot dog bun with american cheese. Consider taking a daily Cranberry supplement for urinary tract health!  Try to stick to 2-3 servings of fruit during the day. Look up "Arm Chair Exercises" on YouTube for seated exercises to do.   MONITORING & EVALUATION Dietary intake, weekly physical activity, and protein intake in 2 months.  Next Steps  Patient is to follow up with RD.

## 2022-12-26 NOTE — Patient Instructions (Addendum)
Look into Premier Protein brand supplements and choose flavors that you like. Have 1 carton EVERY day to increase your protein intake! This will help to rebuild lean mass and boost your immune system.  Begin taking a daily prenatal multivitamin supplement.  Switch to WHOLE wheat bread and a cheddar cheese in place of the hot dog bun with american cheese.  Consider taking a daily Cranberry supplement for urinary tract health!   Try to stick to 2-3 servings of fruit during the day.  Look up "Arm Chair Exercises" on YouTube for seated exercises to do.

## 2022-12-27 ENCOUNTER — Ambulatory Visit: Payer: Self-pay | Admitting: *Deleted

## 2022-12-27 NOTE — Patient Instructions (Signed)
Visit Information  Thank you for taking time to visit with me today. Please don't hesitate to contact me if I can be of assistance to you before our next scheduled telephone appointment.  Please call the care guide team at [phone] if you need to cancel or reschedule your appointment.   Please call the Suicide and Crisis Lifeline: 988 call the [place] National Suicide Prevention Lifeline: (303)106-0184 or TTY: 239-315-9228 TTY (610)505-5422) to talk to a trained counselor call 1-800-273-TALK (toll free, 24 hour hotline) call 911 if you are experiencing a Mental Health or Behavioral Health Crisis or need someone to talk to.  Patient verbalizes understanding of instructions and care plan provided today and agrees to view in MyChart. Active MyChart status and patient understanding of how to access instructions and care plan via MyChart confirmed with patient.     The patient has been provided with contact information for the care management team and has been advised to call with any health related questions or concerns.   Kemper Durie RN, MSN, CCM [facility], [facility] RN Care Coordinator Direct Dial: [phone] / Main [phone] Fax [phone] Email: [email or web address] Website: [email or web address]

## 2022-12-27 NOTE — Patient Outreach (Signed)
Care Coordination   Follow Up Visit Note   12/27/2022 Name: Victoria Patterson MRN: [id number] DOB: 1960/05/21  Victoria Patterson is a 62 y.o. year old female who sees Sparks, Duane Lope, MD for primary care. I spoke with  Victoria Patterson by phone today.  What matters to the patients health and wellness today?  Patient has had 50 pound weight loss since May. State she experienced gout and  UTI, and stopped eating several foods.  She is a vegetarian, does not have a good intake of protein.  Was seen by dietician yesterday, feels the education that was provided will help with weight gain. Denies any urgent concerns, encouraged to contact this care manager with questions.    Goals Addressed             This Visit's Progress    Care Coordination Activities  - no follow up needed       Interventions Today    Flowsheet Row Most Recent Value  Chronic Disease   Chronic disease during today's visit Other  [unintentional weight loss after gout and UTI]  General Interventions   General Interventions Discussed/Reviewed General Interventions Reviewed, Doctor Visits  Doctor Visits Discussed/Reviewed Doctor Visits Reviewed, PCP, Specialist  [upcoming - PCP 10/21, GI 11/5, Urology 11/7, dietician 12/17]  PCP/Specialist Visits Compliance with follow-up visit  Exercise Interventions   Exercise Discussed/Reviewed Weight Managment  Weight Management Weight maintenance  Education Interventions   Education Provided Provided Education  Provided Verbal Education On Nutrition, Blood Sugar Monitoring, When to see the doctor  [monitors blod sugars and blood pressure twice daily]  Nutrition Interventions   Nutrition Discussed/Reviewed Nutrition Reviewed, Increasing proteins, Supplemental nutrition  [working with dietician to increase and stabilize weight]              SDOH assessments and interventions completed:  Yes  SDOH Interventions Today    Flowsheet Row Most Recent Value  SDOH Interventions    Food Insecurity Interventions Intervention Not Indicated  Housing Interventions Intervention Not Indicated  Transportation Interventions Intervention Not Indicated        Care Coordination Interventions:  Yes, provided   Follow up plan: No further intervention required.   Encounter Outcome:  Patient Visit Completed   Kemper Durie RN, MSN, CCM [place]  [facility], [facility] RN Care Coordinator Direct Dial: [phone] / Main [phone] Fax [phone] Email: [email or web address]

## 2023-01-01 DIAGNOSIS — E7800 Pure hypercholesterolemia, unspecified: Secondary | ICD-10-CM | POA: Diagnosis not present

## 2023-01-01 DIAGNOSIS — G720 Drug-induced myopathy: Secondary | ICD-10-CM | POA: Diagnosis not present

## 2023-01-01 DIAGNOSIS — I10 Essential (primary) hypertension: Secondary | ICD-10-CM | POA: Diagnosis not present

## 2023-01-01 DIAGNOSIS — Z79899 Other long term (current) drug therapy: Secondary | ICD-10-CM | POA: Diagnosis not present

## 2023-01-01 DIAGNOSIS — T466X5A Adverse effect of antihyperlipidemic and antiarteriosclerotic drugs, initial encounter: Secondary | ICD-10-CM | POA: Diagnosis not present

## 2023-01-01 DIAGNOSIS — M10079 Idiopathic gout, unspecified ankle and foot: Secondary | ICD-10-CM | POA: Diagnosis not present

## 2023-01-01 DIAGNOSIS — Z1231 Encounter for screening mammogram for malignant neoplasm of breast: Secondary | ICD-10-CM | POA: Diagnosis not present

## 2023-01-01 DIAGNOSIS — E039 Hypothyroidism, unspecified: Secondary | ICD-10-CM | POA: Diagnosis not present

## 2023-01-01 DIAGNOSIS — Z23 Encounter for immunization: Secondary | ICD-10-CM | POA: Diagnosis not present

## 2023-01-01 DIAGNOSIS — E118 Type 2 diabetes mellitus with unspecified complications: Secondary | ICD-10-CM | POA: Diagnosis not present

## 2023-01-02 ENCOUNTER — Other Ambulatory Visit: Payer: Self-pay | Admitting: Internal Medicine

## 2023-01-02 DIAGNOSIS — F422 Mixed obsessional thoughts and acts: Secondary | ICD-10-CM | POA: Diagnosis not present

## 2023-01-02 DIAGNOSIS — Z1231 Encounter for screening mammogram for malignant neoplasm of breast: Secondary | ICD-10-CM
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
## 2023-01-04 ENCOUNTER — Other Ambulatory Visit: Payer: 59 | Admitting: Urology

## 2023-01-09 ENCOUNTER — Ambulatory Visit: Payer: 59 | Admitting: Psychiatry

## 2023-01-09 ENCOUNTER — Encounter: Payer: Self-pay | Admitting: Psychiatry

## 2023-01-09 VITALS — BP 91/59 | HR 60 | Temp 96.9°F | Ht 63.0 in | Wt 165.0 lb

## 2023-01-09 DIAGNOSIS — G4700 Insomnia, unspecified: Secondary | ICD-10-CM

## 2023-01-09 DIAGNOSIS — F429 Obsessive-compulsive disorder, unspecified: Secondary | ICD-10-CM

## 2023-01-09 DIAGNOSIS — F3181 Bipolar II disorder: Secondary | ICD-10-CM

## 2023-01-09 MED ORDER — DULOXETINE HCL 30 MG PO CPEP: 30.0000 mg | ORAL_CAPSULE | Freq: Every day | ORAL | 0 refills | Status: DC

## 2023-01-09 NOTE — Patient Instructions (Signed)
Continue duloxetine 30 mg daily  Continue lamotrigine 200 mg daily Next appointment: 1/23 at 11 am Consider light box  Generally, the light box should: Emit full-spectrum light with either fluorescent or LED bulbs (fluorescent is usually recommended) Provide an exposure to 10,000 lux of light Produce as little UV light as possible  Typical recommendations include using the light box: Within the 30 mins to first hour of waking up in the morning For about 20 to 30 minutes About 16 to 24 inches (41 to 61 centimeters) from your face, but follow the manufacturer's instructions about distance With eyes open, but not looking directly at the light

## 2023-01-15 NOTE — Progress Notes (Unsigned)
01/18/2023 1:57 PM   Victoria Patterson [birth year] [id number]  Referring provider: Marguarite Arbour, MD [address] [facility] [place],  [place] [address]  Urological history: 1. Cystitis -contributing factors of age, GSM, diabetes, obesity and constipation -Documented urine cultures over the last year  November 09, 2022 MUF   November 03, 2022 <10,000  September 28, 2022 - Klebsiella pneumoniae  Aug 10, 2022 - mixed urogenital flora  Jul 26, 2022 - no growth  Jul 14, 2022 - mixed urogenital flora -Vaginal estrogen cream and suppressive antibiotics for 3 months  2.  High risk hematuria -Non-smoker -CTU (11/2022) - NED -cysto (12/2022) - NED   No chief complaint on file.  HPI: Victoria Patterson is a 62 y.o. female who presents today for one month follow up after a trial of Gemtesa.   Previous records reviewed.    PVR ***   PMH: Past Medical History:  Diagnosis Date   ADHD (attention deficit hyperactivity disorder)    Anxiety    Arthritis    Asthma    controlled for many years   Depression    Diabetes mellitus, type II (HCC)    Fatty liver    Hypertension    Hypothyroidism    Obsessive-compulsive disorder     Surgical History: Past Surgical History:  Procedure Laterality Date   BREAST CYST ASPIRATION Left 1992   neg   CHOLECYSTECTOMY     dilation and evacuation     x2   HYSTEROSCOPY WITH D & C N/A 02/04/2018   Procedure: DILATATION AND CURETTAGE Melton Krebs, fractional;  Surgeon: Schermerhorn, Ihor Austin, MD;  Location: [facility] ORS;  Service: Gynecology;  Laterality: N/A;    Home Medications:  Allergies as of 01/18/2023       Reactions   Bactrim [sulfamethoxazole-trimethoprim] Other (See Comments)   hypoglycemia   Ciprofloxacin Other (See Comments)   She experienced hypertension and pedal edema which caused her to seek treatment in the ED.   Penicillins Hives   Has patient had a PCN reaction causing immediate rash, facial/tongue/throat swelling,  SOB or lightheadedness with hypotension: No Has patient had a PCN reaction causing severe rash involving mucus membranes or skin necrosis: Yes Has patient had a PCN reaction that required hospitalization: No Has patient had a PCN reaction occurring within the last 10 years: No If all of the above answers are "NO", then may proceed with Cephalosporin use.   Stadol [butorphanol] Anxiety        Medication List        Accurate as of January 15, 2023  1:57 PM. If you have any questions, ask your nurse or doctor.          Accu-Chek Softclix Lancets lancets 1 each 3 (three) times daily.   allopurinol 100 MG tablet Commonly known as: ZYLOPRIM Take 150 mg by mouth daily.   ciprofloxacin 500 MG tablet Commonly known as: CIPRO Take 500 mg by mouth 2 (two) times daily.   DULoxetine 30 MG capsule Commonly known as: CYMBALTA Take 1 capsule (30 mg total) by mouth daily. Start taking on: January 16, 2023   ezetimibe 10 MG tablet Commonly known as: ZETIA Take 10 mg by mouth daily.   Gemtesa 75 MG Tabs Generic drug: Vibegron Take 1 tablet (75 mg total) by mouth daily.   lamoTRIgine 200 MG tablet Commonly known as: LAMICTAL Take 1 tablet (200 mg total) by mouth daily.   levothyroxine 75 MCG tablet Commonly known as: SYNTHROID  Take 75 mcg by mouth daily before breakfast.   Precision QID Test test strip Generic drug: glucose blood 2 (two) times daily at 10 AM and 5 PM.   predniSONE 10 MG tablet Commonly known as: DELTASONE once.   Premarin vaginal cream Generic drug: conjugated estrogens Apply 0.5mg  (pea-sized amount)  just inside the vaginal introitus with a finger-tip on  Monday, Wednesday and Friday nights.   Procysbi 300 MG Pack Generic drug: Cysteamine Bitartrate once.   RA Cranberry 500 MG Caps Generic drug: Cranberry Take by mouth once.        Allergies:  Allergies  Allergen Reactions   Bactrim [Sulfamethoxazole-Trimethoprim] Other (See Comments)     hypoglycemia   Ciprofloxacin Other (See Comments)    She experienced hypertension and pedal edema which caused her to seek treatment in the ED.   Penicillins Hives    Has patient had a PCN reaction causing immediate rash, facial/tongue/throat swelling, SOB or lightheadedness with hypotension: No Has patient had a PCN reaction causing severe rash involving mucus membranes or skin necrosis: Yes Has patient had a PCN reaction that required hospitalization: No Has patient had a PCN reaction occurring within the last 10 years: No If all of the above answers are "NO", then may proceed with Cephalosporin use.    Stadol [Butorphanol] Anxiety    Family History: Family History  Problem Relation Age of Onset   Ovarian cancer Mother 76   Bipolar disorder Mother    Breast cancer Paternal Aunt 58   Breast cancer Cousin        maternal   Alcohol abuse Father     Social History:  reports that she has never smoked. She has never used smokeless tobacco. She reports that she does not drink alcohol and does not use drugs.  ROS: Pertinent ROS in HPI  Physical Exam: There were no vitals taken for this visit.  Constitutional:  Well nourished. Alert and oriented, No acute distress. HEENT: [place] AT, moist mucus membranes.  Trachea midline, no masses. Cardiovascular: No clubbing, cyanosis, or edema. Respiratory: Normal respiratory effort, no increased work of breathing. GU: No CVA tenderness.  No bladder fullness or masses.  Recession of labia minora, dry, pale vulvar vaginal mucosa and loss of mucosal ridges and folds.  Normal urethral meatus, no lesions, no prolapse, no discharge.   No urethral masses, tenderness and/or tenderness. No bladder fullness, tenderness or masses. *** vagina mucosa, *** estrogen effect, no discharge, no lesions, *** pelvic support, *** cystocele and *** rectocele noted.  No cervical motion tenderness.  Uterus is freely mobile and non-fixed.  No adnexal/parametria masses or  tenderness noted.  Anus and perineum are without rashes or lesions.   ***  Neurologic: Grossly intact, no focal deficits, moving all 4 extremities. Psychiatric: Normal mood and affect.    Laboratory Data: N/A  Pertinent Imaging: ***   Assessment & Plan:    1. Cystitis -Gemtesa samples   2. Vaginal atrophy -Continue vaginal estrogen cream 3 days weekly  3. Cystocele -Explained that this may be contributing to her postvoid dribbling -Recommend Kegel exercises  4. Microscopic hematuria -Non-smoker -recent work up NED -no reports of gross heme  No follow-ups on file.  These notes generated with voice recognition software. I apologize for typographical errors.  Cloretta Ned  [facility] [address]  [address] [place], [place] [address] [phone]

## 2023-01-16 DIAGNOSIS — F422 Mixed obsessional thoughts and acts: Secondary | ICD-10-CM | POA: Diagnosis not present

## 2023-01-16 DIAGNOSIS — Q438 Other specified congenital malformations of intestine: Secondary | ICD-10-CM | POA: Diagnosis not present

## 2023-01-16 DIAGNOSIS — K5730 Diverticulosis of large intestine without perforation or abscess without bleeding: Secondary | ICD-10-CM | POA: Diagnosis not present

## 2023-01-16 DIAGNOSIS — R194 Change in bowel habit: Secondary | ICD-10-CM | POA: Diagnosis not present

## 2023-01-16 DIAGNOSIS — K5909 Other constipation: Secondary | ICD-10-CM | POA: Diagnosis not present

## 2023-01-18 ENCOUNTER — Other Ambulatory Visit: Payer: 59 | Admitting: Urology

## 2023-01-18 ENCOUNTER — Ambulatory Visit (INDEPENDENT_AMBULATORY_CARE_PROVIDER_SITE_OTHER): Payer: 59 | Admitting: Urology

## 2023-01-18 ENCOUNTER — Encounter: Payer: Self-pay | Admitting: Urology

## 2023-01-18 VITALS — BP 81/47 | HR 69 | Ht 63.0 in | Wt 165.0 lb

## 2023-01-18 DIAGNOSIS — R319 Hematuria, unspecified: Secondary | ICD-10-CM

## 2023-01-18 DIAGNOSIS — N3281 Overactive bladder: Secondary | ICD-10-CM

## 2023-01-18 DIAGNOSIS — N8111 Cystocele, midline: Secondary | ICD-10-CM

## 2023-01-18 DIAGNOSIS — Z87440 Personal history of urinary (tract) infections: Secondary | ICD-10-CM | POA: Diagnosis not present

## 2023-01-18 DIAGNOSIS — N952 Postmenopausal atrophic vaginitis: Secondary | ICD-10-CM | POA: Diagnosis not present

## 2023-01-18 DIAGNOSIS — N3020 Other chronic cystitis without hematuria: Secondary | ICD-10-CM

## 2023-01-18 LAB — URINALYSIS, COMPLETE
Bilirubin, UA: NEGATIVE
Glucose, UA: NEGATIVE
Ketones, UA: NEGATIVE
Nitrite, UA: NEGATIVE
Protein,UA: NEGATIVE
RBC, UA: NEGATIVE
Specific Gravity, UA: 1.02 (ref 1.005–1.030)
Urobilinogen, Ur: 0.2 mg/dL (ref 0.2–1.0)
pH, UA: 5 (ref 5.0–7.5)

## 2023-01-18 LAB — BLADDER SCAN AMB NON-IMAGING: Scan Result: 0

## 2023-01-18 LAB — MICROSCOPIC EXAMINATION
Epithelial Cells (non renal): >10 /[HPF] — AB (ref 0–10)
WBC, UA: >30 /[HPF] — AB (ref 0–5)

## 2023-01-18 MED ORDER — GEMTESA 75 MG PO TABS
75.0000 mg | ORAL_TABLET | Freq: Every day | ORAL | 3 refills | Status: DC
Start: 2023-01-18 — End: 2023-04-19

## 2023-01-24 DIAGNOSIS — H6121 Impacted cerumen, right ear: Secondary | ICD-10-CM | POA: Diagnosis not present

## 2023-01-24 DIAGNOSIS — H6061 Unspecified chronic otitis externa, right ear: Secondary | ICD-10-CM | POA: Diagnosis not present

## 2023-01-25 ENCOUNTER — Other Ambulatory Visit: Payer: Self-pay | Admitting: Urology

## 2023-01-30 DIAGNOSIS — F422 Mixed obsessional thoughts and acts: Secondary | ICD-10-CM | POA: Diagnosis not present

## 2023-02-01 ENCOUNTER — Ambulatory Visit: Payer: 59 | Admitting: Dermatology

## 2023-02-12 DIAGNOSIS — F422 Mixed obsessional thoughts and acts: Secondary | ICD-10-CM | POA: Diagnosis not present

## 2023-02-13 DIAGNOSIS — E7800 Pure hypercholesterolemia, unspecified: Secondary | ICD-10-CM | POA: Diagnosis not present

## 2023-02-13 DIAGNOSIS — I10 Essential (primary) hypertension: Secondary | ICD-10-CM | POA: Diagnosis not present

## 2023-02-13 DIAGNOSIS — E118 Type 2 diabetes mellitus with unspecified complications: Secondary | ICD-10-CM | POA: Diagnosis not present

## 2023-02-13 DIAGNOSIS — M10079 Idiopathic gout, unspecified ankle and foot: Secondary | ICD-10-CM | POA: Diagnosis not present

## 2023-02-13 DIAGNOSIS — Z79899 Other long term (current) drug therapy: Secondary | ICD-10-CM | POA: Diagnosis not present

## 2023-02-16 DIAGNOSIS — E118 Type 2 diabetes mellitus with unspecified complications: Secondary | ICD-10-CM | POA: Diagnosis not present

## 2023-02-16 DIAGNOSIS — E039 Hypothyroidism, unspecified: Secondary | ICD-10-CM | POA: Diagnosis not present

## 2023-02-16 DIAGNOSIS — E782 Mixed hyperlipidemia: Secondary | ICD-10-CM | POA: Diagnosis not present

## 2023-02-16 DIAGNOSIS — I10 Essential (primary) hypertension: Secondary | ICD-10-CM | POA: Diagnosis not present

## 2023-02-16 DIAGNOSIS — Z0000 Encounter for general adult medical examination without abnormal findings: Secondary | ICD-10-CM | POA: Diagnosis not present

## 2023-02-19 ENCOUNTER — Ambulatory Visit
Admission: RE | Admit: 2023-02-19 | Discharge: 2023-02-19 | Disposition: A | Discharge status: Home / Self Care | Payer: 59 | Source: Ambulatory Visit | Attending: Internal Medicine | Admitting: Internal Medicine

## 2023-02-19 DIAGNOSIS — Z1231 Encounter for screening mammogram for malignant neoplasm of breast: Secondary | ICD-10-CM | POA: Diagnosis not present

## 2023-02-20 DIAGNOSIS — H6061 Unspecified chronic otitis externa, right ear: Secondary | ICD-10-CM | POA: Diagnosis not present

## 2023-02-20 DIAGNOSIS — H6123 Impacted cerumen, bilateral: Secondary | ICD-10-CM | POA: Diagnosis not present

## 2023-02-22 ENCOUNTER — Encounter: Payer: Self-pay | Admitting: Psychiatry

## 2023-02-22 ENCOUNTER — Ambulatory Visit (INDEPENDENT_AMBULATORY_CARE_PROVIDER_SITE_OTHER): Payer: 59 | Admitting: Psychiatry

## 2023-02-22 VITALS — BP 98/62 | HR 73 | Temp 97.4°F | Ht 63.0 in | Wt 163.6 lb

## 2023-02-22 DIAGNOSIS — F3181 Bipolar II disorder: Secondary | ICD-10-CM

## 2023-02-22 DIAGNOSIS — G4700 Insomnia, unspecified: Secondary | ICD-10-CM | POA: Diagnosis not present

## 2023-02-22 DIAGNOSIS — F429 Obsessive-compulsive disorder, unspecified: Secondary | ICD-10-CM

## 2023-02-22 MED ORDER — LAMOTRIGINE 200 MG PO TABS: 200.0000 mg | ORAL_TABLET | Freq: Every day | ORAL | 1 refills | Status: DC

## 2023-02-22 MED ORDER — VENLAFAXINE HCL ER 37.5 MG PO CP24: ORAL_CAPSULE | ORAL | 0 refills | Status: DC

## 2023-02-22 MED ORDER — VENLAFAXINE HCL ER 150 MG PO CP24: 150.0000 mg | ORAL_CAPSULE | Freq: Every day | ORAL | 1 refills | Status: DC
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
## 2023-02-22 NOTE — Patient Instructions (Signed)
Discontinue duloxetine  Start venlafaxine 37.5 mg daily for one week, then 75 mg daily for one week, then 150 mg daily  Continue lamotrigine 200 mg daily Next appointment: 2/3 at 4 pm

## 2023-02-23 DIAGNOSIS — G720 Drug-induced myopathy: Secondary | ICD-10-CM | POA: Diagnosis not present

## 2023-02-23 DIAGNOSIS — R7303 Prediabetes: Secondary | ICD-10-CM | POA: Diagnosis not present

## 2023-02-23 DIAGNOSIS — T466X5A Adverse effect of antihyperlipidemic and antiarteriosclerotic drugs, initial encounter: Secondary | ICD-10-CM | POA: Diagnosis not present

## 2023-02-23 DIAGNOSIS — Z888 Allergy status to other drugs, medicaments and biological substances status: Secondary | ICD-10-CM | POA: Diagnosis not present

## 2023-02-23 DIAGNOSIS — E782 Mixed hyperlipidemia: Secondary | ICD-10-CM | POA: Diagnosis not present

## 2023-02-27 ENCOUNTER — Encounter: Payer: 59 | Attending: Physician Assistant | Admitting: Dietician

## 2023-02-27 ENCOUNTER — Encounter: Payer: Self-pay | Admitting: Dietician

## 2023-02-27 DIAGNOSIS — E119 Type 2 diabetes mellitus without complications: Secondary | ICD-10-CM | POA: Insufficient documentation

## 2023-02-27 NOTE — Patient Instructions (Addendum)
Try a WHEY protein powder with at least 20g of protein per serving. Consider mixing it with an almond cashew, or coconut milk and refrigerate it for a smoother shake. Try a chocolate protein powder and chocolate plant based milk for a stronger chocolate flavor.  Work to stabilize your weight around 160 lbs.   Pay attention to how your fingernails look (smoother), look for your hair to get stronger, feel more energetic/strong!! These are all signs that you are giving your body proper protein and nutrition to stay strong!

## 2023-02-27 NOTE — Progress Notes (Signed)
Medical Nutrition Therapy  Appointment Start time:  [phone]  Appointment End time:  88  Primary concerns today: Weight Loss, Gout flares  Referral diagnosis: I10 - HTN, E11.8 - T2DM Preferred learning style: No preference indicated Learning readiness: Change in Progress   NUTRITION ASSESSMENT   Clinical Medical Hx: T2DM, HTN, Gout, Weight loss, UTIs Medications: Allopurinol, Duloxetine, Ezetimibe, Levothyroxine, Lamictal Labs: NEW (02/13/2023) Cholesterol - 307, LDL - 226, A1c - 6.4%, BUN - 29 Notable Signs/Symptoms: Slight muscle wasting (interosseous, deltoid), ridges in fingernails, hair loss (IMPROVED)   Lifestyle & Dietary Hx Pt reports drinking Premier Protein daily in the morning, pt reports feeling more energy and strength, has been able to rake leaves in their yard. Pt reports increase in appetite as well, eating more nuts. Pt reports they will be starting Repatha for familial hypercholesterolemia. Pt reports checking glucose once a day fasting, numbers tend to range between 104 -118 mg/dL. Pt reports having 1 slice of cheese for an evening snack each day to avoid low blood sugar in the evening. Pt reports beginning to take a prenatal multivitamin.   Estimated daily fluid intake: 48-64 oz Supplements: Prenatal MVI, Cranberry Sleep: No issues Stress / self-care: Mild Current average weekly physical activity: ADLs, Knee pain hinders most activity   24-Hr Dietary Recall First Meal: Cheese on bread, grape juice Snack: Apple, Cashews Snack: Protein Shake Second Meal: 2 slices of cheese on oat nut bread, mustard Snack: 1 slice cheese Beverages: Grape juice, Water w/ lemon   NUTRITION DIAGNOSIS  [place]-3.2 Unintentional weight loss As related to restrictive eating.  As evidenced by weight loss for 50 lbs x 5 months, fear of eating, muscle wasting.   NUTRITION INTERVENTION  Nutrition education (E-1) on the following topics:  Educated patient on importance of adequate  protein intake daily. Educated patient on signs/symptoms of insufficient protein intake (hair loss, grooves in nails, recurrent infections, weakness, muscle wasting, etc.). Educated patient on sources of plant-based protein, as well as appropriate high protein ONS. Educated patient on importance of whole grains, and whole fruits to maximize nutrition with sources of carbs.   Handouts Provided Include  Low Purine Nutrition Therapy Power Plate Fruits list   Learning Style & Readiness for Change Teaching method utilized: Visual & Auditory  Demonstrated degree of understanding via: Teach Back  Barriers to learning/adherence to lifestyle change: Fear of gout pain   Goals Established by Pt Try a WHEY protein powder with at least 20g of protein per serving. Consider mixing it with an almond cashew, or coconut milk and refrigerate it for a smoother shake. Try a chocolate protein powder and chocolate plant based milk for a stronger chocolate flavor. Work to stabilize your weight around 160 lbs.  Pay attention to how your fingernails look (smoother), look for your hair to get stronger, feel more energetic/strong!! These are all signs that you are giving your body proper protein and nutrition to stay strong!   MONITORING & EVALUATION Dietary intake, weekly physical activity, and protein intake in 2 months.  Next Steps  Patient is to follow up with RD.

## 2023-02-28 DIAGNOSIS — Z79899 Other long term (current) drug therapy: Secondary | ICD-10-CM | POA: Diagnosis not present

## 2023-02-28 DIAGNOSIS — M1A0790 Idiopathic chronic gout, unspecified ankle and foot, without tophus (tophi): Secondary | ICD-10-CM | POA: Diagnosis not present

## 2023-03-04 ENCOUNTER — Other Ambulatory Visit: Payer: Self-pay | Admitting: Psychiatry

## 2023-03-13 DIAGNOSIS — F422 Mixed obsessional thoughts and acts: Secondary | ICD-10-CM | POA: Diagnosis not present

## 2023-03-27 DIAGNOSIS — F422 Mixed obsessional thoughts and acts: Secondary | ICD-10-CM | POA: Diagnosis not present

## 2023-04-05 ENCOUNTER — Ambulatory Visit: Payer: 59 | Admitting: Psychiatry

## 2023-04-06 DIAGNOSIS — H6123 Impacted cerumen, bilateral: Secondary | ICD-10-CM | POA: Diagnosis not present

## 2023-04-06 DIAGNOSIS — Z7689 Persons encountering health services in other specified circumstances: Secondary | ICD-10-CM | POA: Diagnosis not present

## 2023-04-06 DIAGNOSIS — H9201 Otalgia, right ear: Secondary | ICD-10-CM | POA: Diagnosis not present

## 2023-04-06 DIAGNOSIS — H6061 Unspecified chronic otitis externa, right ear: Secondary | ICD-10-CM | POA: Diagnosis not present
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
## 2023-04-16 ENCOUNTER — Other Ambulatory Visit: Payer: Self-pay | Admitting: Psychiatry

## 2023-04-16 ENCOUNTER — Ambulatory Visit (INDEPENDENT_AMBULATORY_CARE_PROVIDER_SITE_OTHER): Payer: 59 | Admitting: Psychiatry

## 2023-04-16 ENCOUNTER — Encounter: Payer: Self-pay | Admitting: Psychiatry

## 2023-04-16 VITALS — BP 116/72 | HR 69 | Temp 97.9°F | Ht 63.5 in | Wt 157.0 lb

## 2023-04-16 DIAGNOSIS — F3181 Bipolar II disorder: Secondary | ICD-10-CM

## 2023-04-16 DIAGNOSIS — G4700 Insomnia, unspecified: Secondary | ICD-10-CM | POA: Diagnosis not present

## 2023-04-16 DIAGNOSIS — F429 Obsessive-compulsive disorder, unspecified: Secondary | ICD-10-CM

## 2023-04-16 NOTE — Patient Instructions (Signed)
Continue duloxetine 30 mg daily  Continue lamotrigine 200 mg daily Next appointment: 4/28 at at 3:30

## 2023-04-17 NOTE — Progress Notes (Signed)
 04/19/2023 10:31 AM   Victoria Patterson 1960/10/11 [id number]  Referring provider: Auston Reyes BIRCH, MD [address] [facility] [place],  [place] [address]  Urological history: 1. Cystitis -contributing factors of age, GSM, diabetes, obesity and constipation -Documented urine cultures over the last year  November 09, 2022 MUF   November 03, 2022 <10,000  September 28, 2022 - Klebsiella pneumoniae  Aug 10, 2022 - mixed urogenital flora  Jul 26, 2022 - no growth  Jul 14, 2022 - mixed urogenital flora -Vaginal estrogen cream and suppressive antibiotics for 3 months  2.  High risk hematuria -Non-smoker -CTU (11/2022) - NED -cysto (12/2022) - NED   Chief Complaint  Patient presents with   Follow-up    follow-up    Over Active Bladder   HPI: Victoria Patterson is a 63 y.o. female who presents today for three month follow up.  Previous records reviewed.    She is having 1-7 daytime voids, 1-2 episodes of nocturia with a mild urge to urinate during the day day and a strong urge to urinate overnight.  She has urinary leakage 1-2 times daily if she does not fully empty her bladder by bending forward, she is not wearing absorbent products for urinary leakage, she does not limit fluid intake and she does not toilet map.  Patient denies any modifying or aggravating factors.  Patient denies any recent UTI's, gross hematuria, dysuria or suprapubic/flank pain.  Patient denies any fevers, chills, nausea or vomiting.    PVR 0 mL   She is mostly satisfied with her urinary symptoms at this time, but she has some other issues that are resolving and she is wondering once those are no longer taking precedent, will her urinary symptoms bother her more.  PMH: Past Medical History:  Diagnosis Date   ADHD (attention deficit hyperactivity disorder)    Anxiety    Arthritis    Asthma    controlled for many years   Depression    Diabetes mellitus, type II (HCC)    Fatty liver     Hypertension    Hypothyroidism    Obsessive-compulsive disorder     Surgical History: Past Surgical History:  Procedure Laterality Date   BREAST CYST ASPIRATION Left 1992   neg   CHOLECYSTECTOMY     dilation and evacuation     x2   HYSTEROSCOPY WITH D & C N/A 02/04/2018   Procedure: DILATATION AND CURETTAGE LELDON, fractional;  Surgeon: Schermerhorn, Debby PARAS, MD;  Location: [facility] ORS;  Service: Gynecology;  Laterality: N/A;    Home Medications:  Allergies as of 04/19/2023       Reactions   Bactrim [sulfamethoxazole-trimethoprim] Other (See Comments)   hypoglycemia   Ciprofloxacin  Other (See Comments)   She experienced hypertension and pedal edema which caused her to seek treatment in the ED.   Penicillins Hives   Has patient had a PCN reaction causing immediate rash, facial/tongue/throat swelling, SOB or lightheadedness with hypotension: No Has patient had a PCN reaction causing severe rash involving mucus membranes or skin necrosis: Yes Has patient had a PCN reaction that required hospitalization: No Has patient had a PCN reaction occurring within the last 10 years: No If all of the above answers are NO, then may proceed with Cephalosporin use.   Stadol [butorphanol] Anxiety        Medication List        Accurate as of April 19, 2023 10:31 AM. If you have any  questions, ask your nurse or doctor.          STOP taking these medications    ciprofloxacin  500 MG tablet Commonly known as: CIPRO  Stopped by: Marialice Newkirk   Gemtesa  75 MG Tabs Generic drug: Vibegron  Stopped by: Kelci Petrella   Procysbi 300 MG Pack Generic drug: Cysteamine Bitartrate Stopped by: Aryona Sill       TAKE these medications    Accu-Chek Softclix Lancets lancets 1 each 3 (three) times daily.   allopurinol  100 MG tablet Commonly known as: ZYLOPRIM  Take 150 mg by mouth daily.   DULoxetine  30 MG capsule Commonly known as: CYMBALTA  Take 1 capsule (30 mg total)  by mouth daily.   ezetimibe  10 MG tablet Commonly known as: ZETIA  Take 10 mg by mouth daily.   lamoTRIgine  200 MG tablet Commonly known as: LAMICTAL  Take 1 tablet (200 mg total) by mouth daily.   levothyroxine  75 MCG tablet Commonly known as: SYNTHROID  Take 75 mcg by mouth daily before breakfast.   Precision QID Test test strip Generic drug: glucose blood 2 (two) times daily at 10 AM and 5 PM.   Premarin  vaginal cream Generic drug: conjugated estrogens  Apply 0.5mg  (pea-sized amount)  just inside the vaginal introitus with a finger-tip on  Monday, Wednesday and Friday nights.   PRENATAL 19 PO Take by mouth.   RA Cranberry 500 MG Caps Generic drug: Cranberry Take by mouth once.        Allergies:  Allergies  Allergen Reactions   Bactrim [Sulfamethoxazole-Trimethoprim] Other (See Comments)    hypoglycemia   Ciprofloxacin  Other (See Comments)    She experienced hypertension and pedal edema which caused her to seek treatment in the ED.   Penicillins Hives    Has patient had a PCN reaction causing immediate rash, facial/tongue/throat swelling, SOB or lightheadedness with hypotension: No Has patient had a PCN reaction causing severe rash involving mucus membranes or skin necrosis: Yes Has patient had a PCN reaction that required hospitalization: No Has patient had a PCN reaction occurring within the last 10 years: No If all of the above answers are NO, then may proceed with Cephalosporin use.    Stadol [Butorphanol] Anxiety    Family History: Family History  Problem Relation Age of Onset   Ovarian cancer Mother 15   Bipolar disorder Mother    Breast cancer Paternal Aunt 72   Breast cancer Cousin        maternal   Alcohol abuse Father     Social History:  reports that she has never smoked. She has never been exposed to tobacco smoke. She has never used smokeless tobacco. She reports that she does not drink alcohol and does not use drugs.  ROS: Pertinent ROS in  HPI  Physical Exam: BP 110/67   Pulse 68   Ht 5' 3 (1.6 m)   Wt 156 lb (70.8 kg)   BMI 27.63 kg/m   Constitutional:  Well nourished. Alert and oriented, No acute distress. HEENT: [place] AT, moist mucus membranes.  Trachea midline, no masses. Cardiovascular: No clubbing, cyanosis, or edema. Respiratory: Normal respiratory effort, no increased work of breathing. Neurologic: Grossly intact, no focal deficits, moving all 4 extremities. Psychiatric: Normal mood and affect.    Laboratory Data: CBC w/auto Differential (5 Part) Order: [id number] Component Ref Range & Units 2 mo ago  WBC (White Blood Cell Count) 4.1 - 10.2 10^3/uL 5.7  RBC (Red Blood Cell Count) 4.04 - 5.48 10^6/uL 3.84 Low   Hemoglobin 12.0 -  15.0 gm/dL 87.7  Hematocrit 64.9 - 47.0 % 35.8  MCV (Mean Corpuscular Volume) 80.0 - 100.0 fl 93.2  MCH (Mean Corpuscular Hemoglobin) 27.0 - 31.2 pg 31.8 High   MCHC (Mean Corpuscular Hemoglobin Concentration) 32.0 - 36.0 gm/dL 65.8  Platelet Count 849 - 450 10^3/uL 308  RDW-CV (Red Cell Distribution Width) 11.6 - 14.8 % 13.2  MPV (Mean Platelet Volume) 9.4 - 12.4 fl 9.9  Neutrophils 1.50 - 7.80 10^3/uL 2.89  Lymphocytes 1.00 - 3.60 10^3/uL 2.07  Monocytes 0.00 - 1.50 10^3/uL 0.4  Eosinophils 0.00 - 0.55 10^3/uL 0.27  Basophils 0.00 - 0.09 10^3/uL 0.03  Neutrophil % 32.0 - 70.0 % 50.9  Lymphocyte % 10.0 - 50.0 % 36.4  Monocyte % 4.0 - 13.0 % 7  Eosinophil % 1.0 - 5.0 % 4.8  Basophil% 0.0 - 2.0 % 0.5  Immature Granulocyte % <=0.7 % 0.4  Immature Granulocyte Count <=0.06 10^3/L 0.02  Resulting Agency [facility]   Specimen Collected: 02/13/23 09:40   Performed by: [facility] Last Resulted: 02/13/23 10:22  Received From: [facility]  Result Received: 02/19/23 09:36    Comprehensive Metabolic Panel (CMP) Order: [id number] Component Ref Range & Units 2 mo ago  Glucose 70 - 110 mg/dL 883 High    Sodium 863 - 145 mmol/L 139  Potassium 3.6 - 5.1 mmol/L 4.7  Chloride 97 - 109 mmol/L 104  Carbon Dioxide (CO2) 22.0 - 32.0 mmol/L 29.9  Urea Nitrogen (BUN) 7 - 25 mg/dL 29 High   Creatinine 0.6 - 1.1 mg/dL 0.9  Glomerular Filtration Rate (eGFR) >60 mL/min/1.73sq m 72  Comment: CKD-EPI (2021) does not include patient's race in the calculation of eGFR.  Monitoring changes of plasma creatinine and eGFR over time is useful for monitoring kidney function.  Interpretive Ranges for eGFR (CKD-EPI 2021):  eGFR:       >60 mL/min/1.73 sq. m - Normal eGFR:       30-59 mL/min/1.73 sq. m - Moderately Decreased eGFR:       15-29 mL/min/1.73 sq. m  - Severely Decreased eGFR:       < 15 mL/min/1.73 sq. m  - Kidney Failure   Note: These eGFR calculations do not apply in acute situations when eGFR is changing rapidly or patients on dialysis.  Calcium  8.7 - 10.3 mg/dL 89.8  AST 8 - 39 U/L 16  ALT 5 - 38 U/L 13  Alk Phos (alkaline Phosphatase) 34 - 104 U/L 79  Albumin 3.5 - 4.8 g/dL 4.5  Bilirubin, Total 0.3 - 1.2 mg/dL 0.3  Protein, Total 6.1 - 7.9 g/dL 6.9  A/G Ratio 1.0 - 5.0 gm/dL 1.9  Resulting Agency [facility]   Specimen Collected: 02/13/23 09:40   Performed by: [facility] Last Resulted: 02/13/23 15:53  Received From: [facility]  Result Received: 02/19/23 09:36   Hemoglobin A1C Order: [id number] Component Ref Range & Units 2 mo ago  Hemoglobin A1C 4.2 - 5.6 % 6.4 High   Average Blood Glucose (Calc) mg/dL 862  Resulting Agency [facility]  Narrative Performed by [facility] Normal Range:    4.2 - 5.6% Increased Risk:  5.7 - 6.4% Diabetes:        >= 6.5% Glycemic Control for adults with diabetes:  <7%    Specimen Collected: 02/13/23 09:40   Performed by: [facility] Last Resulted: 02/13/23  11:54  Received From: [facility]  Result Received: 02/19/23  09:36   Urinalysis w/Microscopic Order: [id number] Component Ref Range & Units 2 mo ago  Color Colorless, Straw, Light Yellow, Yellow, Dark Yellow Light Yellow  Clarity Clear Clear  Specific Gravity 1.005 - 1.030 1.018  pH, Urine 5.0 - 8.0 5  Protein, Urinalysis Negative mg/dL Negative  Glucose, Urinalysis Negative mg/dL Negative  Ketones, Urinalysis Negative mg/dL Negative  Blood, Urinalysis Negative Negative  Nitrite, Urinalysis Negative Negative  Leukocyte Esterase, Urinalysis Negative Negative  Bilirubin, Urinalysis Negative Negative  Urobilinogen, Urinalysis 0.2 - 1.0 mg/dL 0.2  WBC, UA <=5 /hpf 1  Red Blood Cells, Urinalysis <=3 /hpf 1  Bacteria, Urinalysis 0 - 5 /hpf 0-5  Squamous Epithelial Cells, Urinalysis /hpf 0  Resulting Agency [facility]   Specimen Collected: 02/13/23 12:02   Performed by: [facility] Last Resulted: 02/13/23 15:19  Received From: [facility]  Result Received: 02/19/23 09:36  I have reviewed the labs.  See HPI.     Pertinent Imaging:  04/19/23 10:10  Scan Result 0ml    Assessment & Plan:    1. OAB -Gemtesa  was not covered by her insurance -She will be getting Medicare in March -She would like to have an appointment in 2 months to revisit her urinary symptoms and also to have an appointment on the books in case she needs me urgently  2. Vaginal atrophy -Continue vaginal estrogen cream 3 days weekly  3. Cystocele -Postvoid dribbling resolved by tipping over after urination to empty her bladder -encourage Kegel exercises  4. Microscopic hematuria -Non-smoker -recent work up NED -no reports of gross heme -UA no micro heme (02/2023)   Return in 2 months (on 06/17/2023) for 2 months for oab, pvr.  These notes generated with voice recognition software. I apologize for typographical errors.  Victoria Patterson  [facility] [address]   [address] [place], [place] [address] [phone]

## 2023-04-18 DIAGNOSIS — H6121 Impacted cerumen, right ear: Secondary | ICD-10-CM | POA: Diagnosis not present

## 2023-04-18 DIAGNOSIS — H6061 Unspecified chronic otitis externa, right ear: Secondary | ICD-10-CM | POA: Diagnosis not present

## 2023-04-18 DIAGNOSIS — H900 Conductive hearing loss, bilateral: Secondary | ICD-10-CM | POA: Diagnosis not present

## 2023-04-19 ENCOUNTER — Ambulatory Visit: Payer: Self-pay | Admitting: Urology

## 2023-04-19 ENCOUNTER — Encounter: Payer: Self-pay | Admitting: Urology

## 2023-04-19 VITALS — BP 110/67 | HR 68 | Ht 63.0 in | Wt 156.0 lb

## 2023-04-19 DIAGNOSIS — Z87898 Personal history of other specified conditions: Secondary | ICD-10-CM

## 2023-04-19 DIAGNOSIS — N8111 Cystocele, midline: Secondary | ICD-10-CM | POA: Diagnosis not present

## 2023-04-19 DIAGNOSIS — N3281 Overactive bladder: Secondary | ICD-10-CM | POA: Diagnosis not present

## 2023-04-19 DIAGNOSIS — N952 Postmenopausal atrophic vaginitis: Secondary | ICD-10-CM

## 2023-04-19 DIAGNOSIS — R319 Hematuria, unspecified: Secondary | ICD-10-CM

## 2023-04-19 LAB — BLADDER SCAN AMB NON-IMAGING

## 2023-04-23 ENCOUNTER — Ambulatory Visit: Payer: 59 | Admitting: Licensed Clinical Social Worker

## 2023-04-24 DIAGNOSIS — F422 Mixed obsessional thoughts and acts: Secondary | ICD-10-CM | POA: Diagnosis not present

## 2023-05-01 ENCOUNTER — Encounter: Payer: Self-pay | Admitting: Dietician

## 2023-05-01 ENCOUNTER — Encounter: Payer: 59 | Attending: Physician Assistant | Admitting: Dietician

## 2023-05-01 DIAGNOSIS — H6061 Unspecified chronic otitis externa, right ear: Secondary | ICD-10-CM | POA: Diagnosis not present

## 2023-05-01 DIAGNOSIS — E119 Type 2 diabetes mellitus without complications: Secondary | ICD-10-CM | POA: Diagnosis not present

## 2023-05-01 DIAGNOSIS — H6121 Impacted cerumen, right ear: Secondary | ICD-10-CM | POA: Diagnosis not present

## 2023-05-01 DIAGNOSIS — H9201 Otalgia, right ear: Secondary | ICD-10-CM | POA: Diagnosis not present

## 2023-05-01 NOTE — Progress Notes (Signed)
Medical Nutrition Therapy  Appointment Start time:  1150  Appointment End time:  1235  Primary concerns today: Weight Loss, Gout flares  Referral diagnosis: I10 - HTN, E11.8 - T2DM Preferred learning style: No preference indicated Learning readiness: Change in Progress   NUTRITION ASSESSMENT   Clinical Medical Hx: T2DM, HTN, Gout, Weight loss, UTIs Medications: Allopurinol, Duloxetine, Ezetimibe, Levothyroxine, Lamictal Labs: NEW (02/13/2023) Cholesterol - 307, LDL - 226, A1c - 6.4%, BUN - 29 Notable Signs/Symptoms: Slight muscle wasting (interosseous, deltoid), ridges in fingernails, hair loss   Lifestyle & Dietary Hx Pt reports insurance would not cover Repatha, states they will be going on Medicare as of next month. Pt reports getting chocolate protein powder and is drinking it with water, states they do not like the taste. Pt reports having difficulty eating vegetables recently, states they have been going bad before they eat them. Pt reports checking FBG and blood pressure at home daily, FBG ~100 mg/dL, blood pressure around 110/70.   Estimated daily fluid intake: 48-64 oz Supplements: Prenatal MVI, Cranberry Sleep: No issues Stress / self-care: Mild Current average weekly physical activity: ADLs, Knee pain hinders most activity   24-Hr Dietary Recall First Meal: 2 slices of Wheat bread w/ peanut butter, 1/2 cup grape juice Snack: Apple, Cashews Snack: Protein Shake Second Meal: 2 slices of cheese on oat nut bread, 1/4 pickle Snack: Beverages: Grape juice, Water w/ Sunkist flavor pack   NUTRITION DIAGNOSIS  [place]-3.2 Unintentional weight loss As related to restrictive eating.  As evidenced by weight loss for 50 lbs x 5 months, fear of eating, muscle wasting.   NUTRITION INTERVENTION  Nutrition education (E-1) on the following topics:  Educated patient on importance of adequate protein intake daily. Educated patient on signs/symptoms of insufficient protein intake (hair  loss, grooves in nails, recurrent infections, weakness, muscle wasting, etc.). Educated patient on sources of plant-based protein, as well as appropriate high protein ONS. Educated patient on importance of whole grains, and whole fruits to maximize nutrition with sources of carbs.   Handouts Provided Include  Low Purine Nutrition Therapy Power Plate Fruits list  Learning Style & Readiness for Change Teaching method utilized: Visual & Auditory  Demonstrated degree of understanding via: Teach Back  Barriers to learning/adherence to lifestyle change: Hx restrictive eating   Goals Established by Pt Have your protein powder with a 2% or less milk! Try putting it in the freezer for 30-45 minutes to thicken it up, if you like! Look for "Steam in Bag" vegetables in the frozen section! When having your peanut butter toast in the morning, please be more liberal with your peanut butter on your toast.  When having your apple and cashews, continue to have a larger portion of cashews each day! Work on weight maintenance around 150-155 lbs.    MONITORING & EVALUATION Dietary intake, weekly physical activity, and protein intake in 2 months.  Next Steps  Patient is to follow up with RD.

## 2023-05-01 NOTE — Patient Instructions (Addendum)
Have your protein powder with a 2% or less milk! Try putting it in the freezer for 30-45 minutes to thicken it up, if you like!  Look for "Steam in Bag" vegetables in the frozen section!  When having your peanut butter toast in the morning, please be more liberal with your peanut butter on your toast.   When having your apple and cashews, continue to have a larger portion of cashews each day!  Work on weight maintenance around 150-155 lbs.

## 2023-05-03 IMAGING — MG MM DIGITAL SCREENING BILAT W/ TOMO AND CAD
8 series · 8 of 24 positions shown · non-contrast
Comparison: Previous exam(s).

CLINICAL DATA: Screening.

EXAM:
DIGITAL SCREENING BILATERAL MAMMOGRAM WITH TOMOSYNTHESIS AND CAD
TECHNIQUE: Bilateral screening digital craniocaudal and mediolateral oblique
mammograms were obtained. Bilateral screening digital breast
tomosynthesis was performed. The images were evaluated with
computer-aided detection.

[L MLO synth-2D]
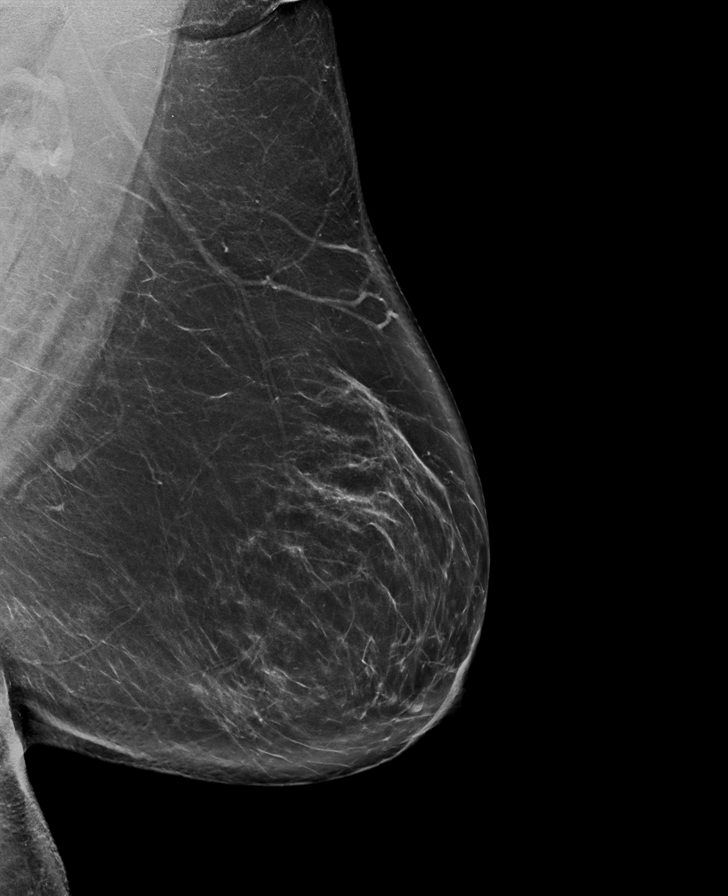

[R MLO synth-2D]
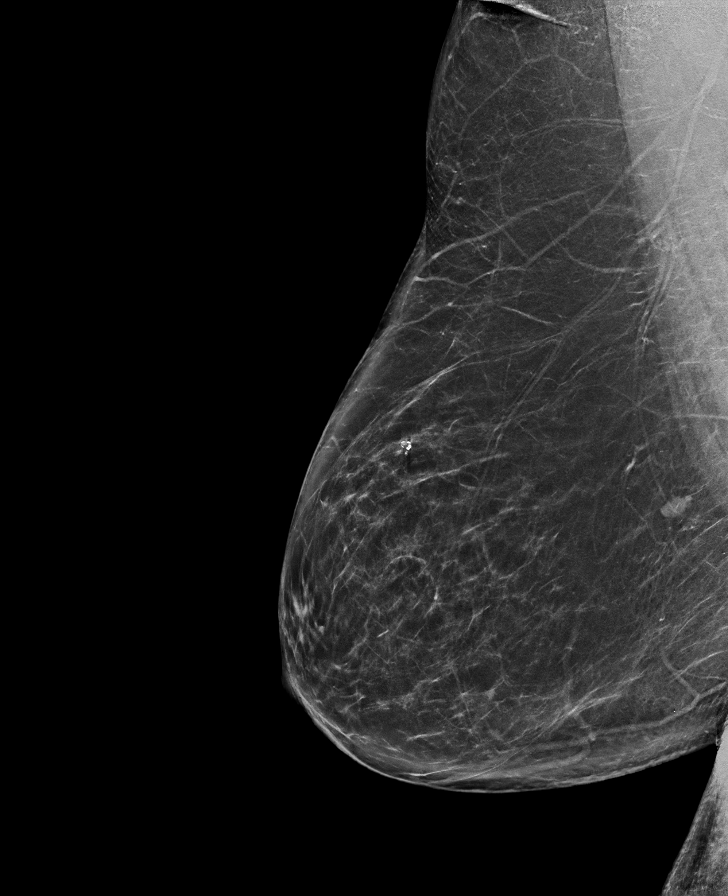

[L CC synth-2D]
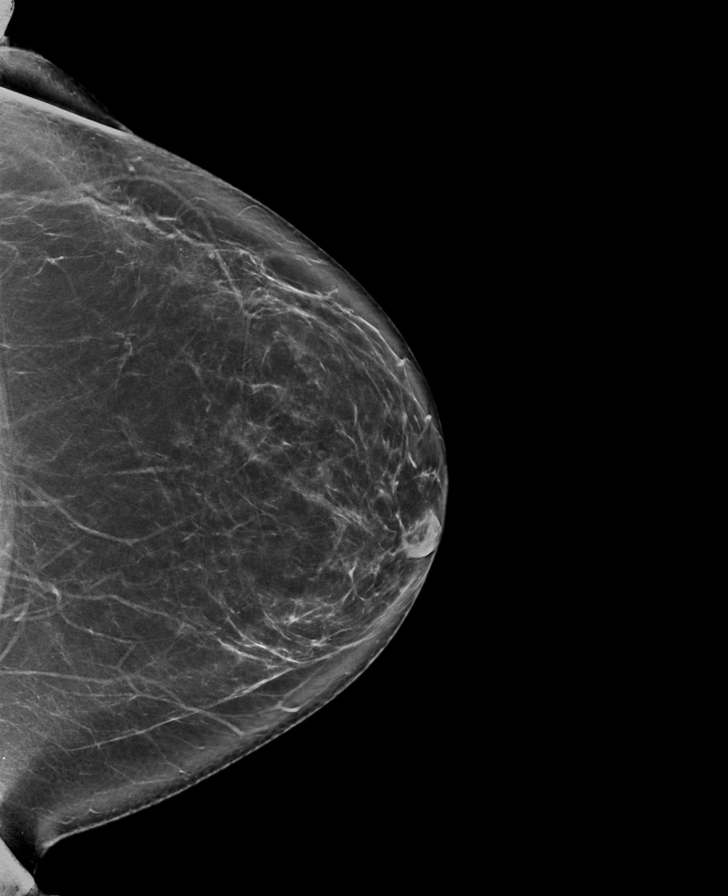

[R CC synth-2D]
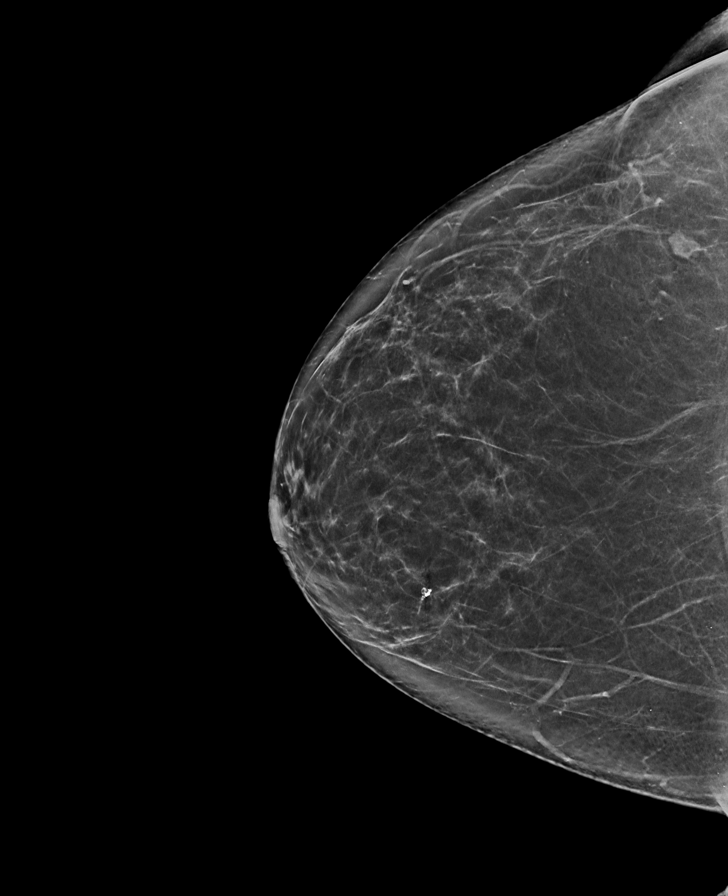

[L CC tomo · tomo slice 45/89.0]
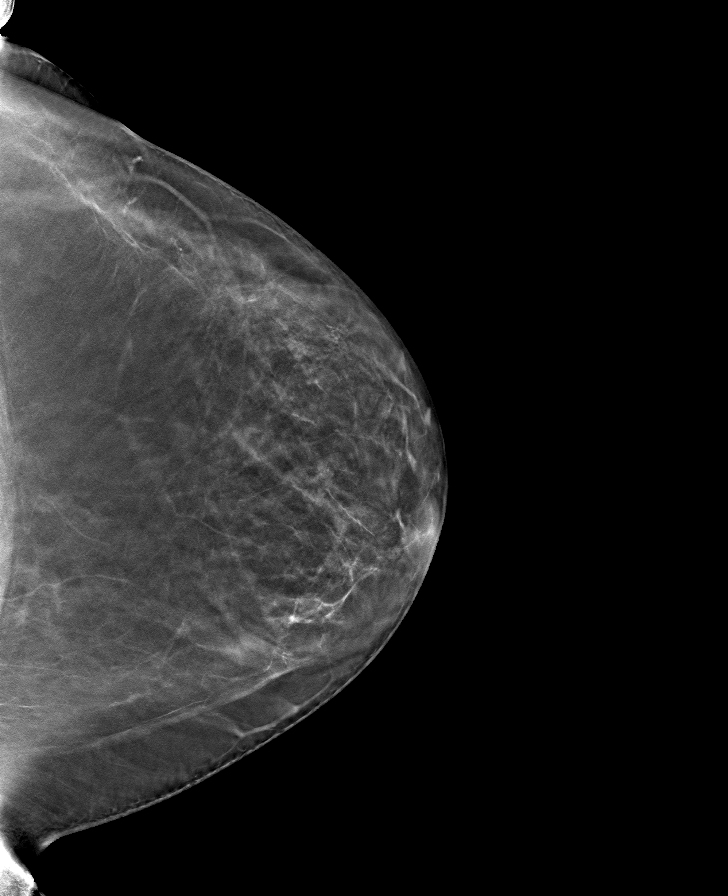

[R MLO tomo · tomo slice 41/82.0]
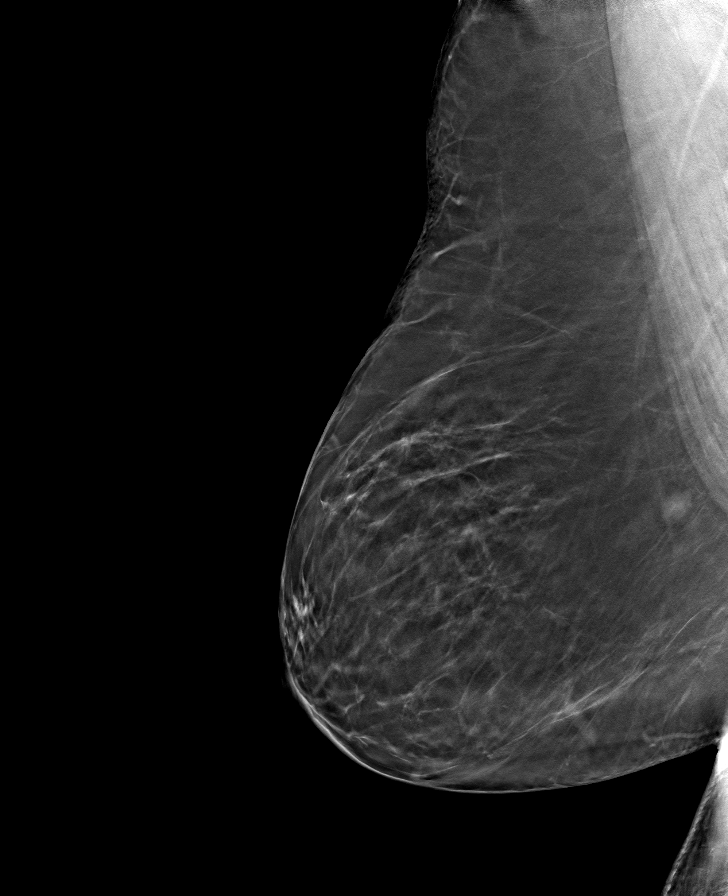

[L MLO tomo · tomo slice 47/94.0]
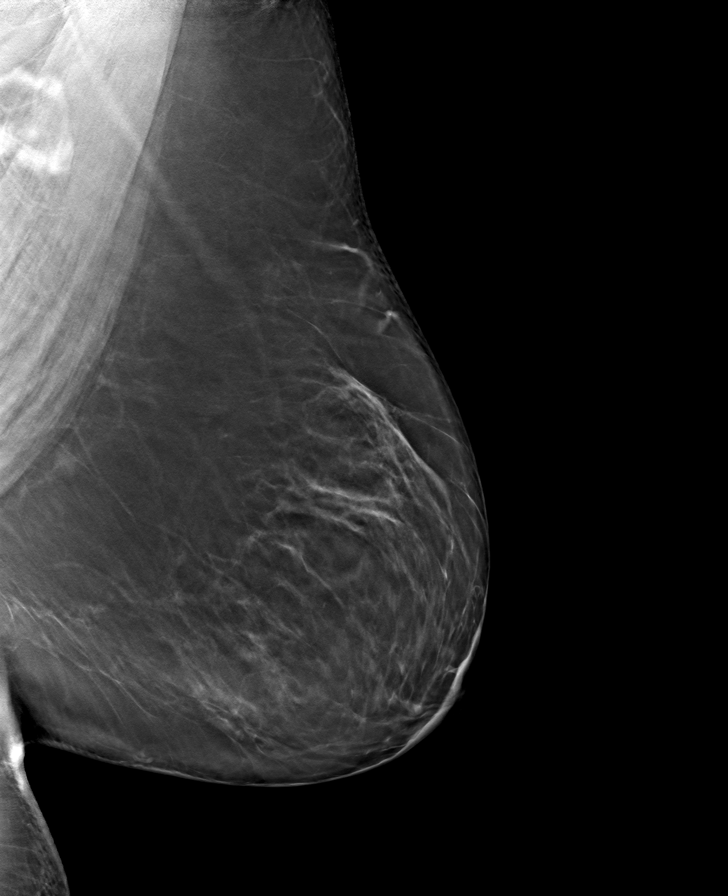

[R CC tomo · tomo slice 38/75.0]
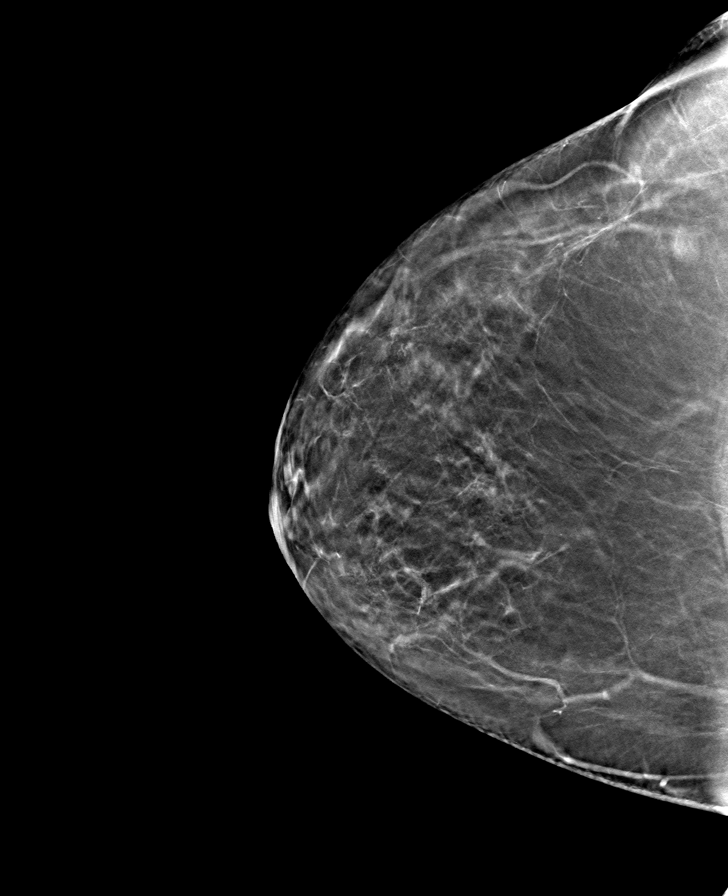

[8 of 24 positions shown; findings below may reference images not displayed]

ACR Breast Density Category b: There are scattered areas of
fibroglandular density.
FINDINGS: There are no findings suspicious for malignancy.
IMPRESSION: No mammographic evidence of malignancy. A result letter of this
screening mammogram will be mailed directly to the patient.

RECOMMENDATION:
Screening mammogram in one year. (Code:51-O-LD2)

BI-RADS CATEGORY  1: Negative.

## 2023-05-08 DIAGNOSIS — F422 Mixed obsessional thoughts and acts: Secondary | ICD-10-CM | POA: Diagnosis not present

## 2023-05-11 DIAGNOSIS — H6122 Impacted cerumen, left ear: Secondary | ICD-10-CM | POA: Diagnosis not present

## 2023-05-11 DIAGNOSIS — H6061 Unspecified chronic otitis externa, right ear: Secondary | ICD-10-CM | POA: Diagnosis not present

## 2023-05-16 ENCOUNTER — Ambulatory Visit: Payer: 59 | Admitting: Dermatology

## 2023-05-16 ENCOUNTER — Encounter: Payer: Self-pay | Admitting: Dermatology

## 2023-05-16 DIAGNOSIS — E559 Vitamin D deficiency, unspecified: Secondary | ICD-10-CM | POA: Diagnosis not present

## 2023-05-16 DIAGNOSIS — B351 Tinea unguium: Secondary | ICD-10-CM | POA: Diagnosis not present

## 2023-05-16 DIAGNOSIS — L650 Telogen effluvium: Secondary | ICD-10-CM

## 2023-05-16 DIAGNOSIS — L219 Seborrheic dermatitis, unspecified: Secondary | ICD-10-CM | POA: Diagnosis not present

## 2023-05-16 DIAGNOSIS — L249 Irritant contact dermatitis, unspecified cause: Secondary | ICD-10-CM

## 2023-05-16 MED ORDER — KETOCONAZOLE 2 % EX SHAM: MEDICATED_SHAMPOO | CUTANEOUS | 11 refills | Status: DC

## 2023-05-16 MED ORDER — TRIAMCINOLONE ACETONIDE 0.1 % EX CREA: TOPICAL_CREAM | CUTANEOUS | 2 refills | Status: AC

## 2023-05-16 MED ORDER — CICLOPIROX 8 % EX SOLN: CUTANEOUS | 11 refills | Status: AC

## 2023-05-16 MED ORDER — FLUCONAZOLE 200 MG PO TABS: ORAL_TABLET | ORAL | 0 refills | Status: AC

## 2023-05-16 NOTE — Progress Notes (Signed)
 Follow-Up Visit   Subjective  Victoria Patterson is a 63 y.o. female who presents for the following: Toenail issues. R-3 toenail. Thickened, discolored. Concerned could be a fungus. States she was diagnosed with a fungus in her ear by ENT.  Would like to follow up with Dr. Roseanne Reno regarding hair loss. Saw Dr. Katrinka Blazing 12/19/2022. No treatment performed at that time. States she took OTC iron supplement but it did not help. Has lost 60 lbs since 06/2022. Lost a lot of weight after being diagnosed with gout. States she was afraid to eat. Does not eat meat or eggs. Is currently seeing a dietician. Is taking prenatal vitamins. Taking Premier Protein powder 30 gm daily.    The following portions of the chart were reviewed this encounter and updated as appropriate: medications, allergies, medical history  Review of Systems:  No other skin or systemic complaints except as noted in HPI or Assessment and Plan.  Objective  Well appearing patient in no apparent distress; mood and affect are within normal limits.  A focused examination was performed of the following areas: Scalp, left 3rd toenail  Relevant exam findings are noted in the Assessment and Plan.         Assessment & Plan   SEBORRHEIC DERMATITIS Exam: Pink patches with greasy scale at scalp  Chronic and persistent condition with duration or expected duration over one year. Condition is bothersome/symptomatic for patient. Currently flared.   Seborrheic Dermatitis is a chronic persistent rash characterized by pinkness and scaling most commonly of the mid face but also can occur on the scalp (dandruff), ears; mid chest, mid back and groin.  It tends to be exacerbated by stress and cooler weather.  People who have neurologic disease may experience new onset or exacerbation of existing seborrheic dermatitis.  The condition is not curable but treatable and can be controlled.  Treatment Plan:  Start Ketoconazole 2% shampoo 2-3 times a week  lather on scalp, leave on 5-8 minutes, rinse out.    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 Marked amount of shorter hairs at temporal hair line c/w regrowth.   Chronic and persistent condition with duration or expected duration over one year. Condition is symptomatic/ bothersome to patient. Not currently at goal. Hair loss likely due to large amount of weight loss and possible nutritional deficiencies due to restricted diet.  Telogen effluvium is a benign, self-limited condition causing increased hair shedding usually for several months. It does not progress to baldness, and the hair eventually grows back on its own. It can be triggered by recent illness, recent surgery, thyroid disease, low iron stores, vitamin D deficiency, fad diets or rapid weight loss, hormonal changes such as pregnancy or birth control pills, and some medication. Usually the hair loss starts 2-3 months after the illness or health change. Rarely, it can continue for longer than a year. Treatments options may include oral or topical Minoxidil; Red Light scalp treatments; Biotin 2.5 mg daily and other options.  Treatment Plan: Will discuss treatment options pending lab results.  Check ferritin, Vit D, pt had 02/13/23 TSH normal  Recommend healthy diet. Do not skip meals. Make sure you are eating enough protein.  Can try Vital Proteins to add more protein to diet to help hair growth. Discussed adding Viviscal hair growth vitamins.  Review with dietician.  ONYCHOMYCOSIS Exam: Thickened R-3 toenail with white discoloration, onycholysis and subungal debris c/w onychomycosis  Chronic and persistent condition with duration or expected  duration over one year. Condition is symptomatic/ bothersome to patient. Not currently at goal.  Treatment Plan: Start Ciclopirox 8% to affected toenail and surrounding skin every night at bedtime. Remove with nail polish remover weekly then repeat  process.  02/13/23 LFTs normal Start Fluconazole 200 mg take 1 tablet once a week until finished.  #30 (dispensed as once daily, but pt instructed to take once weekly)  Side effects of fluconazole (diflucan) include nausea, diarrhea, headache, dizziness, taste changes, rare risk of irritation of the liver, allergy, or decreased blood counts (which could show up as infection or tiredness).    HAND DERMATITIS, Irritant  Exam Scaly pink patches, severe xerosis BL hands/fingers, pt washes hands a lot and uses soft soap.  Chronic and persistent condition with duration or expected duration over one year. Condition is bothersome/symptomatic for patient. Currently flared.  Hand Dermatitis is a chronic type of eczema that can come and go on the hands and fingers.  While there is no cure, the rash and symptoms can be managed with topical prescription medications, and for more severe cases, with systemic medications.  Recommend mild soap and routine use of moisturizing cream after handwashing.  Minimize soap/water exposure when possible.    Treatment Plan Start Triamcinolone 0.1% cream twice a day up to 2 weeks as needed on hands for rash/itching.  Avoid washing hands as frequently. Avoid using Soft-Soap, use more gentle soap like Dove. Recommend mild soap and moisturizing cream with hand washing.  Moisturizing cream samples given today (Eucerin, Neutrogena, Cicaplast Balm, Cetaphil, CeraVe). CLN hand-wash sample given today.  TELOGEN EFFLUVIUM   Related Procedures Vitamin D, 25-hydroxy Ferritin VITAMIN D DEFICIENCY   Related Procedures Vitamin D, 25-hydroxy Ferritin  Return for toenail follow up, check moles in 2-3 months.  I, Lawson Radar, CMA, am acting as scribe for Willeen Niece, MD.   Documentation: I have reviewed the above documentation for accuracy and completeness, and I agree with the above.  Willeen Niece, MD

## 2023-05-16 NOTE — Patient Instructions (Addendum)
 Toenail: Start Ciclopirox 8% to affected toenail and surrounding skin every night at bedtime. Remove with alcohol the 7 night then repeat process.  Start Fluconazole 200 mg take 1 tablet once a week until finished.      Hands: Start Triamcinolone 0.1% cream twice a day up to 2 weeks as needed on hands for rash/itching.  Avoid washing hands as frequently. Avoid using Soft-Soap, use more gentle soap. Recommend mild soap and moisturizing cream with hand washing.    Scalp: Start Ketoconazole 2% shampoo 2-3 times a week lather on scalp, leave on 5-8 minutes, rinse out.     Can alternate with OTC medicated shampoos for best results.    Hair Thinning: Will discuss treatment options pending lab results.    Recommend healthy diet. Do not skip meals. Make sure you are eating enough proteins.   Can try Vital Proteins to add more protein to diet.      Discuss adding Viviscal with doctor who follows gout and with dietician.      Gentle Skin Care Guide  1. Bathe no more than once a day.  2. Avoid bathing in hot water  3. Use a mild soap like Dove, Vanicream, Cetaphil, CeraVe. Can use Lever 2000 or Cetaphil antibacterial soap  4. Use soap only where you need it. On most days, use it under your arms, between your legs, and on your feet. Let the water rinse other areas unless visibly dirty.  5. When you get out of the bath/shower, use a towel to gently blot your skin dry, don't rub it.  6. While your skin is still a little damp, apply a moisturizing cream such as Vanicream, CeraVe, Cetaphil, Eucerin, Sarna lotion or plain Vaseline Jelly. For hands apply Neutrogena [place] Hand Cream or Excipial Hand Cream.  7. Reapply moisturizer any time you start to itch or feel dry.  8. Sometimes using free and clear laundry detergents can be helpful. Fabric softener sheets should be avoided. Downy Free & Gentle liquid, or any liquid fabric softener that is free of dyes and perfumes, it  acceptable to use  9. If your doctor has given you prescription creams you may apply moisturizers over them        Due to recent changes in healthcare laws, you may see results of your pathology and/or laboratory studies on MyChart before the doctors have had a chance to review them. We understand that in some cases there may be results that are confusing or concerning to you. Please understand that not all results are received at the same time and often the doctors may need to interpret multiple results in order to provide you with the best plan of care or course of treatment. Therefore, we ask that you please give [place] 2 business days to thoroughly review all your results before contacting the office for clarification. Should we see a critical lab result, you will be contacted sooner.   If You Need Anything After Your Visit  If you have any questions or concerns for your doctor, please call our main line at [phone] and press option 4 to reach your doctor's medical assistant. If no one answers, please leave a voicemail as directed and we will return your call as soon as possible. Messages left after 4 pm will be answered the following business day.   You may also send [place] a message via MyChart. We typically respond to MyChart messages within 1-2 business days.  For prescription refills, please ask your pharmacy to  contact our office. Our fax number is [phone].  If you have an urgent issue when the clinic is closed that cannot wait until the next business day, you can page your doctor at the number below.    Please note that while we do our best to be available for urgent issues outside of office hours, we are not available 24/7.   If you have an urgent issue and are unable to reach [place], you may choose to seek medical care at your doctor's office, retail clinic, urgent care center, or emergency room.  If you have a medical emergency, please immediately call 911 or go to the emergency  department.  Pager Numbers  - Dr. Gwen Pounds: [phone]  - Dr. Roseanne Reno: [phone]  - Dr. Katrinka Blazing: [phone]   In the event of inclement weather, please call our main line at [phone] for an update on the status of any delays or closures.  Dermatology Medication Tips: Please keep the boxes that topical medications come in in order to help keep track of the instructions about where and how to use these. Pharmacies typically print the medication instructions only on the boxes and not directly on the medication tubes.   If your medication is too expensive, please contact our office at [phone] option 4 or send [place] a message through MyChart.   We are unable to tell what your co-pay for medications will be in advance as this is different depending on your insurance coverage. However, we may be able to find a substitute medication at lower cost or fill out paperwork to get insurance to cover a needed medication.   If a prior authorization is required to get your medication covered by your insurance company, please allow [place] 1-2 business days to complete this process.  Drug prices often vary depending on where the prescription is filled and some pharmacies may offer cheaper prices.  The website www.goodrx.com contains coupons for medications through different pharmacies. The prices here do not account for what the cost may be with help from insurance (it may be cheaper with your insurance), but the website can give you the price if you did not use any insurance.  - You can print the associated coupon and take it with your prescription to the pharmacy.  - You may also stop by our office during regular business hours and pick up a GoodRx coupon card.  - If you need your prescription sent electronically to a different pharmacy, notify our office through [facility] or by phone at [phone] option 4.     Si Usted Necesita Algo Despus de Su Visita  Tambin puede enviarnos  un mensaje a travs de Clinical cytogeneticist. Por lo general respondemos a los mensajes de MyChart en el transcurso de 1 a 2 das hbiles.  Para renovar recetas, por favor pida a su farmacia que se ponga en contacto con nuestra oficina. Annie Sable de fax es [place] [phone].  Si tiene un asunto urgente cuando la clnica est cerrada y que no puede esperar hasta el siguiente da hbil, puede llamar/localizar a su doctor(a) al nmero que aparece a continuacin.   Por favor, tenga en cuenta que aunque hacemos todo lo posible para estar disponibles para asuntos urgentes fuera del horario de [place], no estamos disponibles las 24 horas del da, los 7 [address]  [address] de la [place].   Si tiene un problema urgente y no puede comunicarse con nosotros, puede optar por buscar atencin mdica  en el consultorio de su doctor(a), en [place]  clnica privada, en un centro de atencin urgente o en una sala de emergencias.  Si tiene Engineer, drilling, por favor llame inmediatamente al 911 o vaya a la sala de emergencias.  Nmeros de bper  - Dr. Gwen Pounds: [phone]  - Dra. Roseanne Reno: [phone]  - Dr. Katrinka Blazing: [phone]   En caso de inclemencias del tiempo, por favor llame a Lacy Duverney principal al [phone] para una actualizacin sobre el [place] de cualquier retraso o cierre.  Consejos para la medicacin en dermatologa: Por favor, guarde las cajas en las que vienen los medicamentos de uso tpico para ayudarle a seguir las instrucciones sobre dnde y cmo usarlos. Las farmacias generalmente imprimen las instrucciones del medicamento slo en las cajas y no directamente en los tubos del [place].   Si su medicamento es muy caro, por favor, pngase en contacto con Rolm Gala llamando al [phone] y presione la opcin 4 o envenos un mensaje a travs de Clinical cytogeneticist.   No podemos decirle cul ser su copago por los medicamentos por adelantado ya que esto es diferente dependiendo de la cobertura de su seguro. Sin  embargo, es posible que podamos encontrar un medicamento sustituto a Audiological scientist un formulario para que el seguro cubra el medicamento que se considera necesario.   Si se requiere una autorizacin previa para que su compaa de seguros [place] su medicamento, por favor permtanos de 1 a 2 das hbiles para completar [address]  Los precios de los medicamentos varan con frecuencia dependiendo del Environmental consultant de dnde se surte la receta y alguna farmacias pueden ofrecer precios ms baratos.  El sitio web www.goodrx.com tiene cupones para medicamentos de Health and safety inspector. Los precios aqu no tienen en cuenta lo que podra costar con la ayuda del seguro (puede ser ms barato con su seguro), pero el sitio web puede darle el precio si no utiliz Tourist information centre manager.  - Puede imprimir el cupn correspondiente y llevarlo con su receta a la farmacia.  - Tambin puede pasar por nuestra oficina durante el horario de atencin regular y Education officer, museum una tarjeta de cupones de GoodRx.  - Si necesita que su receta se enve electrnicamente a una farmacia diferente, informe a nuestra oficina a travs de MyChart de [place] o por telfono llamando al [phone] y presione la opcin 4.

## 2023-05-17 ENCOUNTER — Ambulatory Visit: Payer: Self-pay | Admitting: Licensed Clinical Social Worker

## 2023-05-17 LAB — VITAMIN D 25 HYDROXY (VIT D DEFICIENCY, FRACTURES): Vit D, 25-Hydroxy: 24.2 ng/mL — ABNORMAL LOW (ref 30.0–100.0)

## 2023-05-17 LAB — FERRITIN: Ferritin: 101 ng/mL (ref 15–150)

## 2023-05-21 ENCOUNTER — Telehealth: Payer: Self-pay

## 2023-05-21 NOTE — Telephone Encounter (Signed)
-----   Message from Willeen Niece sent at 05/21/2023 10:17 AM EDT ----- Ferritin (iron stores) is normal, Vit D is low and she should start a Vit D supplement 5,000 international units daily - please call patient

## 2023-05-21 NOTE — Telephone Encounter (Signed)
 Left message for patient to call for lab results.

## 2023-05-22 ENCOUNTER — Telehealth: Payer: Self-pay

## 2023-05-22 NOTE — Telephone Encounter (Signed)
 Advised pt of lab results.  Advised pt Dr. Roseanne Reno recommends her starting Vitamin D 5,000 international units daily./sh

## 2023-05-22 NOTE — Telephone Encounter (Signed)
-----   Message from Willeen Niece sent at 05/21/2023 10:17 AM EDT ----- Ferritin (iron stores) is normal, Vit D is low and she should start a Vit D supplement 5,000 international units daily - please call patient

## 2023-05-31 ENCOUNTER — Ambulatory Visit: Payer: 59 | Admitting: Dermatology

## 2023-06-05 ENCOUNTER — Ambulatory Visit (INDEPENDENT_AMBULATORY_CARE_PROVIDER_SITE_OTHER): Payer: Self-pay | Admitting: Professional Counselor

## 2023-06-05 DIAGNOSIS — F429 Obsessive-compulsive disorder, unspecified: Secondary | ICD-10-CM
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
## 2023-06-06 DIAGNOSIS — E782 Mixed hyperlipidemia: Secondary | ICD-10-CM | POA: Diagnosis not present

## 2023-06-06 DIAGNOSIS — E119 Type 2 diabetes mellitus without complications: Secondary | ICD-10-CM | POA: Diagnosis not present

## 2023-06-06 DIAGNOSIS — E039 Hypothyroidism, unspecified: Secondary | ICD-10-CM | POA: Diagnosis not present

## 2023-06-06 DIAGNOSIS — R634 Abnormal weight loss: Secondary | ICD-10-CM | POA: Diagnosis not present

## 2023-06-06 DIAGNOSIS — I10 Essential (primary) hypertension: Secondary | ICD-10-CM | POA: Diagnosis not present

## 2023-06-06 DIAGNOSIS — Z79899 Other long term (current) drug therapy: Secondary | ICD-10-CM | POA: Diagnosis not present

## 2023-06-07 ENCOUNTER — Other Ambulatory Visit: Payer: Self-pay | Admitting: Internal Medicine

## 2023-06-07 DIAGNOSIS — R634 Abnormal weight loss: Secondary | ICD-10-CM

## 2023-06-07 DIAGNOSIS — I10 Essential (primary) hypertension: Secondary | ICD-10-CM | POA: Diagnosis not present

## 2023-06-21 ENCOUNTER — Ambulatory Visit: Payer: 59 | Admitting: Urology

## 2023-06-27 ENCOUNTER — Encounter: Payer: 59 | Attending: Physician Assistant | Admitting: Dietician

## 2023-06-27 ENCOUNTER — Encounter: Payer: Self-pay | Admitting: Dietician

## 2023-06-27 DIAGNOSIS — E119 Type 2 diabetes mellitus without complications: Secondary | ICD-10-CM | POA: Diagnosis not present

## 2023-06-27 NOTE — Progress Notes (Signed)
 Medical Nutrition Therapy  Appointment Start time:  15  Appointment End time:  1235  Primary concerns today: Weight Loss, Gout flares  Referral diagnosis: I10 - HTN, E11.8 - T2DM Preferred learning style: No preference indicated Learning readiness: Change in Progress   NUTRITION ASSESSMENT   Clinical Medical Hx: T2DM, HTN, Gout, Weight loss, UTIs Medications: Allopurinol, Duloxetine, Ezetimibe, Levothyroxine, Lamictal Labs: (NEW 06/06/2023) Cholesterol - 275, LDL - 202, A1c - 6.1%, BUN - 25, RBC - 3.71, Hgb - 11.7, HCT - 34.9, Vit D - 24.2 Notable Signs/Symptoms: Slight muscle wasting (interosseous, deltoid), ridges in fingernails, hair loss   Lifestyle & Dietary Hx Pt reports continued fear of eating, continues to eat a very limited diet out of fear of gout flare up. Pt reports increasing their portion sizes of peanut butter and cashews, eating an extra PB sandwich during the day at times. Pt reports continuing to drink a chocolate Premier Protein shake daily as well. Pt reports trying to increase vegetable intake, but has been struggling to find new options that they like. Pt reports walking again daily, walks for ~30 minutes (2.5 miles), states this is all they can do before knee soreness.   Estimated daily fluid intake: 48-64 oz Supplements: Prenatal MVI, Cranberry, NEW: Vit D Sleep: No issues Stress / self-care: Mild Current average weekly physical activity: ADLs, Knee pain hinders most activity   24-Hr Dietary Recall First Meal: 2 slices of Wheat bread w/ peanut butter, 1/2 cup grape juice Snack: Apple, PB sandwich Snack: Protein Shake Second Meal: 2 slices of cheese on oat nut bread, 1/4 pickle Snack: Beverages: Grape juice, Water w/ Sunkist flavor pack   NUTRITION DIAGNOSIS  Gratis-3.2 Unintentional weight loss As related to restrictive eating.  As evidenced by weight loss for 50 lbs x 5 months, fear of eating, muscle wasting.   NUTRITION INTERVENTION  Nutrition  education (E-1) on the following topics:  Educated patient on importance of adequate protein intake daily. Educated patient on signs/symptoms of insufficient protein intake (hair loss, grooves in nails, recurrent infections, weakness, muscle wasting, etc.). Educated patient on sources of plant-based protein, as well as appropriate high protein ONS. Educated patient on importance of whole grains, and whole fruits to maximize nutrition with sources of carbs. NEW: Educated patient on dietary sources of iron while following a meatless diet.   Handouts Provided Include  Low Purine Nutrition Therapy Power Plate Fruits list NEW: Iron Rich Nutrition Therapy  Learning Style & Readiness for Change Teaching method utilized: Visual & Auditory  Demonstrated degree of understanding via: Teach Back  Barriers to learning/adherence to lifestyle change: Hx restrictive eating/Fear of food   Goals Established by Pt Incorporate beans and/or lentils into your diet for additional iron and protein!  Ask your son to make you a few different recipes using beans and lentils to see if any of them are to your liking!  Try adding a protein bar as a snack during the day ([employer], [employer], [employer], [employer] One Protein). Work on weight maintenance around 150-155 lbs.    MONITORING & EVALUATION Dietary intake, weekly physical activity, and protein intake in 2 months.  Next Steps  Patient is to follow up with RD.

## 2023-06-27 NOTE — Patient Instructions (Addendum)
 Incorporate beans and/or lentils into your diet for additional iron and protein!   Ask your son to make you a few different recipes using beans and lentils to see if any of them are to your liking!   Try adding a protein bar as a snack during the day (Quest, [employer], [employer], [employer] One Protein).

## 2023-06-28 ENCOUNTER — Inpatient Hospital Stay: Admission: RE | Admit: 2023-06-28 | Source: Ambulatory Visit
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
## 2023-07-09 ENCOUNTER — Encounter: Payer: Self-pay | Admitting: Psychiatry

## 2023-07-09 ENCOUNTER — Ambulatory Visit: Payer: Self-pay | Admitting: Psychiatry

## 2023-07-09 VITALS — BP 112/78 | HR 77 | Temp 98.7°F | Ht 63.0 in | Wt 154.4 lb

## 2023-07-09 DIAGNOSIS — F429 Obsessive-compulsive disorder, unspecified: Secondary | ICD-10-CM | POA: Diagnosis not present

## 2023-07-09 DIAGNOSIS — F3181 Bipolar II disorder: Secondary | ICD-10-CM | POA: Diagnosis not present

## 2023-07-09 DIAGNOSIS — G4700 Insomnia, unspecified: Secondary | ICD-10-CM | POA: Diagnosis not present

## 2023-07-09 MED ORDER — DULOXETINE HCL 30 MG PO CPEP: 30.0000 mg | ORAL_CAPSULE | Freq: Every day | ORAL | 0 refills | Status: DC

## 2023-07-09 NOTE — Patient Instructions (Signed)
 Continue duloxetine  30 mg daily  Continue lamotrigine  200 mg daily Next appointment: 6/16 at 3:30

## 2023-07-23 ENCOUNTER — Ambulatory Visit: Admitting: Professional Counselor

## 2023-07-24 ENCOUNTER — Ambulatory Visit: Admitting: Urology

## 2023-07-24 ENCOUNTER — Telehealth: Payer: Self-pay

## 2023-07-24 ENCOUNTER — Encounter: Payer: Self-pay | Admitting: Internal Medicine

## 2023-07-24 ENCOUNTER — Encounter: Payer: Self-pay | Admitting: Urology

## 2023-07-24 VITALS — BP 120/77 | HR 69 | Ht 63.0 in | Wt 155.0 lb

## 2023-07-24 DIAGNOSIS — N3281 Overactive bladder: Secondary | ICD-10-CM

## 2023-07-24 DIAGNOSIS — R319 Hematuria, unspecified: Secondary | ICD-10-CM

## 2023-07-24 DIAGNOSIS — R634 Abnormal weight loss: Secondary | ICD-10-CM

## 2023-07-24 DIAGNOSIS — I10 Essential (primary) hypertension: Secondary | ICD-10-CM

## 2023-07-24 LAB — BLADDER SCAN AMB NON-IMAGING

## 2023-07-24 NOTE — Telephone Encounter (Deleted)
 pt called states that when he called the pharmacy about his meds, states that they could not fill because the provider did not ok.

## 2023-07-24 NOTE — Telephone Encounter (Signed)
 pt was notified- told to call pharmacy for refill on the 23rd for refill and if she has any more issues to calll office back.

## 2023-07-24 NOTE — Telephone Encounter (Signed)
 left message to call our office back explained issues - pending

## 2023-07-24 NOTE — Telephone Encounter (Signed)
 called states that the insurance told her that provider needed to do a 90 day supply pt was told that rx was written for a 90 day supply. she states that the bottle she got was only 30 day. i told her i would call the pharmacy and find out what is going

## 2023-07-24 NOTE — Progress Notes (Signed)
 07/24/2023 9:51 AM   [name] 26-Jun-1960 [id number]  Referring provider: Yehuda Helms, MD [address] [facility] [place],  [place] [address]  Urological history: 1.  Cystitis - November 09, 2022-mixed urogenital flora - November 03, 2022-less than 10,000 colonies - September 28, 2022-Klebsiella pneumoniae - Aug 10, 2022-mixed urogenital flora - Jul 26, 2022-no growth - Vaginal estrogen cream  2.  High risk hematuria - Non-smoker - CTU (2024) - NED - cysto (2024) - NED  Chief Complaint  Patient presents with   Follow-up   HPI: Victoria Patterson is a 63 y.o. woman who presents today for 2 month follow up.   Previous records reviewed.   She is having 8 or more daytime voids, 1-2 episodes of nocturia with a mild to strong urge to urinate.  She does not have urinary leakage.  She does not limit fluid intake.  She does not engage in toilet mapping.  Patient denies any modifying or aggravating factors.  Patient denies any recent UTI's, gross hematuria, dysuria or suprapubic/flank pain.  Patient denies any fevers, chills, nausea or vomiting.    PVR 7 mL   She is drinking a lot of water and she contributes her urinary frequency to this.  She did not take the Gemtesa  prior because her insurance would not cover it.  PMH: Past Medical History:  Diagnosis Date   ADHD (attention deficit hyperactivity disorder)    Anxiety    Arthritis    Asthma    controlled for many years   Depression    Diabetes mellitus, type II (HCC)    Fatty liver    Hypertension    Hypothyroidism    Obsessive-compulsive disorder     Surgical History: Past Surgical History:  Procedure Laterality Date   BREAST CYST ASPIRATION Left 1992   neg   CHOLECYSTECTOMY     dilation and evacuation     x2   HYSTEROSCOPY WITH D & C N/A 02/04/2018   Procedure: DILATATION AND CURETTAGE Lorraine Roses, fractional;  Surgeon: Schermerhorn, Joselyn Nicely, MD;  Location: [facility] ORS;  Service: Gynecology;   Laterality: N/A;    Home Medications:  Allergies as of 07/24/2023       Reactions   Bactrim [sulfamethoxazole-trimethoprim] Other (See Comments)   hypoglycemia   Ciprofloxacin  Other (See Comments)   She experienced hypertension and pedal edema which caused her to seek treatment in the ED.   Penicillins Hives   Has patient had a PCN reaction causing immediate rash, facial/tongue/throat swelling, SOB or lightheadedness with hypotension: No Has patient had a PCN reaction causing severe rash involving mucus membranes or skin necrosis: Yes Has patient had a PCN reaction that required hospitalization: No Has patient had a PCN reaction occurring within the last 10 years: No If all of the above answers are "NO", then may proceed with Cephalosporin use.   Stadol [butorphanol] Anxiety        Medication List        Accurate as of Jul 24, 2023  9:51 AM. If you have any questions, ask your nurse or doctor.          Accu-Chek Softclix Lancets lancets 1 each 3 (three) times daily.   allopurinol  300 MG tablet Commonly known as: ZYLOPRIM  Take 300 mg by mouth daily.   ciclopirox  8 % solution Commonly known as: PENLAC  Apply over nail and surrounding skin every night at bedtime. Apply daily over previous coat. After seven (7) days, may remove with  alcohol and continue cycle.   clotrimazole 1 % external solution Commonly known as: LOTRIMIN   DULoxetine  30 MG capsule Commonly known as: CYMBALTA  Take 1 capsule (30 mg total) by mouth daily.   ezetimibe  10 MG tablet Commonly known as: ZETIA  Take 10 mg by mouth daily.   fluconazole  200 MG tablet Commonly known as: DIFLUCAN  Take 1 tablet once daily until finished.   ketoconazole  2 % shampoo Commonly known as: NIZORAL  2-3 times a week lather on scalp, leave on 5-8 minutes, rinse out   lamoTRIgine  200 MG tablet Commonly known as: LAMICTAL  Take 1 tablet (200 mg total) by mouth daily.   levothyroxine  75 MCG tablet Commonly known  as: SYNTHROID  Take 75 mcg by mouth daily before breakfast.   Precision QID Test test strip Generic drug: glucose blood 2 (two) times daily at 10 AM and 5 PM.   Premarin  vaginal cream Generic drug: conjugated estrogens  Apply 0.5mg  (pea-sized amount)  just inside the vaginal introitus with a finger-tip on  Monday, Wednesday and Friday nights.   PRENATAL 19 PO Take by mouth.   RA Cranberry 500 MG Caps Generic drug: Cranberry Take by mouth once.   triamcinolone  cream 0.1 % Commonly known as: KENALOG  Apply twice daily to hands up to 2 weeks as needed for rash/itching. Avoid applying to face, groin, and axilla.        Allergies:  Allergies  Allergen Reactions   Bactrim [Sulfamethoxazole-Trimethoprim] Other (See Comments)    hypoglycemia   Ciprofloxacin  Other (See Comments)    She experienced hypertension and pedal edema which caused her to seek treatment in the ED.   Penicillins Hives    Has patient had a PCN reaction causing immediate rash, facial/tongue/throat swelling, SOB or lightheadedness with hypotension: No Has patient had a PCN reaction causing severe rash involving mucus membranes or skin necrosis: Yes Has patient had a PCN reaction that required hospitalization: No Has patient had a PCN reaction occurring within the last 10 years: No If all of the above answers are "NO", then may proceed with Cephalosporin use.    Stadol [Butorphanol] Anxiety    Family History: Family History  Problem Relation Age of Onset   Ovarian cancer Mother 91   Bipolar disorder Mother    Breast cancer Paternal Aunt 40   Breast cancer Cousin        maternal   Alcohol abuse Father     Social History:  reports that she has never smoked. She has never been exposed to tobacco smoke. She has never used smokeless tobacco. She reports that she does not drink alcohol and does not use drugs.  ROS: Pertinent ROS in HPI  Physical Exam: BP 120/77   Pulse 69   Ht 5\' 3"  (1.6 m)   Wt 155 lb  (70.3 kg)   BMI 27.46 kg/m   Constitutional:  Well nourished. Alert and oriented, No acute distress. HEENT: [place] AT, moist mucus membranes.  Trachea midline Cardiovascular: No clubbing, cyanosis, or edema. Respiratory: Normal respiratory effort, no increased work of breathing. Neurologic: Grossly intact, no focal deficits, moving all 4 extremities. Psychiatric: Normal mood and affect.  Laboratory Data: Contains abnormal data CBC w/auto Differential (5 Part) Order: [id number] Component Ref Range & Units 1 mo ago  WBC (White Blood Cell Count) 4.1 - 10.2 10^3/uL 4.3  RBC (Red Blood Cell Count) 4.04 - 5.48 10^6/uL 3.71 Low   Hemoglobin 12.0 - 15.0 gm/dL 40.9 Low   Hematocrit 81.1 - 47.0 % 34.9 Low  MCV (Mean Corpuscular Volume) 80.0 - 100.0 fl 94.1  MCH (Mean Corpuscular Hemoglobin) 27.0 - 31.2 pg 31.5 High   MCHC (Mean Corpuscular Hemoglobin Concentration) 32.0 - 36.0 gm/dL 16.1  Platelet Count 096 - 450 10^3/uL 236  RDW-CV (Red Cell Distribution Width) 11.6 - 14.8 % 12.3  MPV (Mean Platelet Volume) 9.4 - 12.4 fl 9.8  Neutrophils 1.50 - 7.80 10^3/uL 2.07  Lymphocytes 1.00 - 3.60 10^3/uL 1.75  Monocytes 0.00 - 1.50 10^3/uL 0.36  Eosinophils 0.00 - 0.55 10^3/uL 0.11  Basophils 0.00 - 0.09 10^3/uL 0.01  Neutrophil % 32.0 - 70.0 % 48.1  Lymphocyte % 10.0 - 50.0 % 40.7  Monocyte % 4.0 - 13.0 % 8.4  Eosinophil % 1.0 - 5.0 % 2.6  Basophil% 0.0 - 2.0 % 0.2  Immature Granulocyte % <=0.7 % 0  Immature Granulocyte Count <=0.06 10^3/L 0  Resulting Agency [facility]   Specimen Collected: 06/06/23 14:36   Performed by: [facility] Last Resulted: 06/06/23 15:13  Received From: [facility]  Result Received: 06/07/23 10:04   Comprehensive Metabolic Panel (CMP) Order: [id number] Component Ref Range & Units 1 mo ago  Glucose 70 - 110 mg/dL 914  Sodium 782 - 956 mmol/L 137  Potassium 3.6 - 5.1 mmol/L 4.4  Chloride 97  - 109 mmol/L 102  Carbon Dioxide (CO2) 22.0 - 32.0 mmol/L 29.5  Urea Nitrogen (BUN) 7 - 25 mg/dL 25  Creatinine 0.6 - 1.1 mg/dL 1  Glomerular Filtration Rate (eGFR) >60 mL/min/1.73sq m 64  Comment: CKD-EPI (2021) does not include patient's race in the calculation of eGFR.  Monitoring changes of plasma creatinine and eGFR over time is useful for monitoring kidney function.  Interpretive Ranges for eGFR (CKD-EPI 2021):  eGFR:       >60 mL/min/1.73 sq. m - Normal eGFR:       30-59 mL/min/1.73 sq. m - Moderately Decreased eGFR:       15-29 mL/min/1.73 sq. m  - Severely Decreased eGFR:       < 15 mL/min/1.73 sq. m  - Kidney Failure   Note: These eGFR calculations do not apply in acute situations when eGFR is changing rapidly or patients on dialysis.  Calcium  8.7 - 10.3 mg/dL 9.5  AST 8 - 39 U/L 20  ALT 5 - 38 U/L 17  Alk Phos (alkaline Phosphatase) 34 - 104 U/L 87  Albumin 3.5 - 4.8 g/dL 4.4  Bilirubin, Total 0.3 - 1.2 mg/dL 0.4  Protein, Total 6.1 - 7.9 g/dL 6.5  A/G Ratio 1.0 - 5.0 gm/dL 2.1  Resulting Agency [facility]   Specimen Collected: 06/06/23 14:36   Performed by: [facility] Last Resulted: 06/06/23 17:14  Received From: [facility]  Result Received: 06/07/23 10:04   Hemoglobin A1C Order: [id number] Component Ref Range & Units 1 mo ago  Hemoglobin A1C 4.2 - 5.6 % 6.1 High   Average Blood Glucose (Calc) mg/dL 469  Resulting Agency [facility]  Narrative Performed by [facility] Normal Range:    4.2 - 5.6% Increased Risk:  5.7 - 6.4% Diabetes:        >= 6.5% Glycemic Control for adults with diabetes:  <7%    Specimen Collected: 06/06/23 14:36   Performed by: [facility] Last Resulted: 06/06/23 16:58  Received From: [facility]  Result Received: 06/07/23 10:04   Urinalysis  Urinalysis w/Microscopic Order: [id number] Component Ref Range &  Units 1 mo ago  Color Colorless, Straw, Light Yellow, Yellow, Dark Yellow Light Yellow  Clarity Clear Clear  Specific Gravity 1.005 - 1.030 1.011  pH, Urine 5.0 - 8.0 5  Protein, Urinalysis Negative mg/dL Negative  Glucose, Urinalysis Negative mg/dL Negative  Ketones, Urinalysis Negative mg/dL Negative  Blood, Urinalysis Negative Negative  Nitrite, Urinalysis Negative Negative  Leukocyte Esterase, Urinalysis Negative Negative  Bilirubin, Urinalysis Negative Negative  Urobilinogen, Urinalysis 0.2 - 1.0 mg/dL 0.2  WBC, UA <=5 /hpf <1  Red Blood Cells, Urinalysis <=3 /hpf <1  Bacteria, Urinalysis 0 - 5 /hpf 0-5  Squamous Epithelial Cells, Urinalysis /hpf 0  Resulting Agency [facility]   Specimen Collected: 06/07/23 13:08   Performed by: [facility] Last Resulted: 06/07/23 13:48  Received From: [facility]  Result Received: 06/11/23 15:11  I have reviewed the labs.   Pertinent Imaging:  07/24/23 09:22  Scan Result 7ml   Assessment & Plan:    1. OAB (overactive bladder) (Primary) - BLADDER SCAN AMB NON-IMAGING, demonstrates adequate bladder emptying - Symptoms are likely aggravated by increased fluid intake - I did research regarding her Gemtesa  prescription and it looks like her new insurance will not cover the medication either, she would like to hold on any medications until her symptoms worsen  2. High risk hematuria - UA with PCP in March was negative for micro heme - She will report any gross hematuria   Return in about 1 year (around 07/23/2024) for UA, OAB questionnaire and PVR .  These notes generated with voice recognition software. I apologize for typographical errors.  Briant Camper  [facility] [address]  [address] [place], [place] [address] [phone]

## 2023-07-24 NOTE — Telephone Encounter (Signed)
 called back spoke with phar. she states she did not know why rx was filled for just 30 day instead of 90 . she states that insurance will not pay until the 23rd, she stated she made a note in their system about the error

## 2023-08-07 ENCOUNTER — Ambulatory Visit: Admitting: Professional Counselor

## 2023-08-10 ENCOUNTER — Telehealth: Payer: Self-pay

## 2023-08-10 ENCOUNTER — Other Ambulatory Visit: Payer: Self-pay | Admitting: Psychiatry

## 2023-08-10 MED ORDER — HYDROXYZINE HCL 25 MG PO TABS: 25.0000 mg | ORAL_TABLET | Freq: Every day | ORAL | 0 refills | Status: AC | PRN

## 2023-08-10 NOTE — Telephone Encounter (Signed)
 left message that rx for hydroxyzine  25mg  has been sent to the pharmacy and that if anxiety continues to please call our office back for a sooner appt.

## 2023-08-10 NOTE — Telephone Encounter (Signed)
 pt called very upset and crying she going to have to return her dog and she is very upset can you call in something to help her calm down. pt was told that our office would be closing at 12:00 and to check with her pharmacy later this afternoon to see if anything was sent to pharmacy. Pt understood.

## 2023-08-10 NOTE — Telephone Encounter (Signed)
 Hydroxyzine  25 mg daily as needed has been prescribed to help manage her anxiety. If she finds herself struggling more over the next several days, please advise her to reach out so we can schedule a sooner follow-up and support her as needed

## 2023-08-17 ENCOUNTER — Encounter: Payer: Self-pay | Admitting: Internal Medicine

## 2023-08-20 ENCOUNTER — Ambulatory Visit: Admitting: Dermatology

## 2023-08-20 DIAGNOSIS — L219 Seborrheic dermatitis, unspecified: Secondary | ICD-10-CM | POA: Diagnosis not present

## 2023-08-20 DIAGNOSIS — L650 Telogen effluvium: Secondary | ICD-10-CM | POA: Diagnosis not present

## 2023-08-20 DIAGNOSIS — L249 Irritant contact dermatitis, unspecified cause: Secondary | ICD-10-CM | POA: Diagnosis not present

## 2023-08-20 DIAGNOSIS — B351 Tinea unguium: Secondary | ICD-10-CM | POA: Diagnosis not present

## 2023-08-20 MED ORDER — FLUOCINOLONE ACETONIDE 0.01 % OT OIL: TOPICAL_OIL | OTIC | 5 refills | Status: DC

## 2023-08-20 NOTE — Patient Instructions (Addendum)
 Toenail - Start Fluconazole  200 mg take 1 tablet once a week until finished.  (dispensed as once daily, but pt instructed to take once weekly)  Side effects of fluconazole  (diflucan ) include nausea, diarrhea, headache, dizziness, taste changes, rare risk of irritation of the liver, allergy, or decreased blood counts (which could show up as infection or tiredness).  May continue or stop using ciclopirox  solution to toenail.  Hand Dermatitis - Triamcinolone  0.1% cream twice a day up to 2 weeks as needed on hands for rash/itching. Avoid applying to face, groin, and axilla. Use as directed. Long-term use can cause thinning of the skin. Topical steroids (such as triamcinolone , fluocinolone, fluocinonide, mometasone, clobetasol, halobetasol, betamethasone , hydrocortisone) can cause thinning and lightening of the skin if they are used for too long in the same area. Your physician has selected the right strength medicine for your problem and area affected on the body. Please use your medication only as directed by your physician to prevent side effects.    Seborrheic Dermatitis (scalp and ears) - continue ketoconazole  2% shampoo. Start fluocinolone oil - apply 1-2 drops to ears once to twice daily as needed for itch.  Due to recent changes in healthcare laws, you may see results of your pathology and/or laboratory studies on MyChart before the doctors have had a chance to review them. We understand that in some cases there may be results that are confusing or concerning to you. Please understand that not all results are received at the same time and often the doctors may need to interpret multiple results in order to provide you with the best plan of care or course of treatment. Therefore, we ask that you please give us  2 business days to thoroughly review all your results before contacting the office for clarification. Should we see a critical lab result, you will be contacted sooner.   If You Need Anything  After Your Visit  If you have any questions or concerns for your doctor, please call our main line at [phone] and press option 4 to reach your doctor's medical assistant. If no one answers, please leave a voicemail as directed and we will return your call as soon as possible. Messages left after 4 pm will be answered the following business day.   You may also send us  a message via MyChart. We typically respond to MyChart messages within 1-2 business days.  For prescription refills, please ask your pharmacy to contact our office. Our fax number is [phone].  If you have an urgent issue when the clinic is closed that cannot wait until the next business day, you can page your doctor at the number below.    Please note that while we do our best to be available for urgent issues outside of office hours, we are not available 24/7.   If you have an urgent issue and are unable to reach us , you may choose to seek medical care at your doctor's office, retail clinic, urgent care center, or emergency room.  If you have a medical emergency, please immediately call 911 or go to the emergency department.  Pager Numbers  - Dr. Bary Likes: [phone]  - Dr. Annette Barters: [phone]  - Dr. Felipe Horton: [phone]   In the event of inclement weather, please call our main line at [phone] for an update on the status of any delays or closures.  Dermatology Medication Tips: Please keep the boxes that topical medications come in in order to help keep track of the instructions about where and  how to use these. Pharmacies typically print the medication instructions only on the boxes and not directly on the medication tubes.   If your medication is too expensive, please contact our office at [phone] option 4 or send us  a message through MyChart.   We are unable to tell what your co-pay for medications will be in advance as this is different depending on your insurance coverage. However, we may be  able to find a substitute medication at lower cost or fill out paperwork to get insurance to cover a needed medication.   If a prior authorization is required to get your medication covered by your insurance company, please allow us  1-2 business days to complete this process.  Drug prices often vary depending on where the prescription is filled and some pharmacies may offer cheaper prices.  The website www.goodrx.com contains coupons for medications through different pharmacies. The prices here do not account for what the cost may be with help from insurance (it may be cheaper with your insurance), but the website can give you the price if you did not use any insurance.  - You can print the associated coupon and take it with your prescription to the pharmacy.  - You may also stop by our office during regular business hours and pick up a GoodRx coupon card.  - If you need your prescription sent electronically to a different pharmacy, notify our office through [facility] or by phone at [phone] option 4.     Si Usted Necesita Algo Despus de Su Visita  Tambin puede enviarnos un mensaje a travs de Clinical cytogeneticist. Por lo general respondemos a los mensajes de MyChart en el transcurso de 1 a 2 das hbiles.  Para renovar recetas, por favor pida a su farmacia que se ponga en contacto con nuestra oficina. Franz Jacks de fax es [place] [phone].  Si tiene un asunto urgente cuando la clnica est cerrada y que no puede esperar hasta el siguiente da hbil, puede llamar/localizar a su doctor(a) al nmero que aparece a continuacin.   Por favor, tenga en cuenta que aunque hacemos todo lo posible para estar disponibles para asuntos urgentes fuera del horario de [place], no estamos disponibles las 24 horas del da, los 7 [address]  [address] de la [place].   Si tiene un problema urgente y no puede comunicarse con nosotros, puede optar por buscar atencin mdica  en el consultorio de su doctor(a), en una clnica  privada, en un centro de atencin urgente o en una sala de emergencias.  Si tiene Engineer, drilling, por favor llame inmediatamente al 911 o vaya a la sala de emergencias.  Nmeros de bper  - Dr. Bary Likes: [phone]  - Dra. Annette Barters: [phone]  - Dr. Felipe Horton: [phone]   En caso de inclemencias del tiempo, por favor llame a [place] Pila principal al [phone] para una actualizacin sobre el [place] de cualquier retraso o cierre.  Consejos para la medicacin en dermatologa: Por favor, guarde las cajas en las que vienen los medicamentos de uso tpico para ayudarle a seguir las instrucciones sobre dnde y cmo usarlos. Las farmacias generalmente imprimen las instrucciones del medicamento slo en las cajas y no directamente en los tubos del [place].   Si su medicamento es muy caro, por favor, pngase en contacto con Bettyjane Brunet llamando al [phone] y presione la opcin 4 o envenos un mensaje a travs de Clinical cytogeneticist.   No podemos decirle cul ser su copago por los medicamentos por adelantado ya que esto es  diferente dependiendo de la cobertura de su seguro. Sin embargo, es posible que podamos encontrar un medicamento sustituto a Audiological scientist un formulario para que el seguro cubra el medicamento que se considera necesario.   Si se requiere una autorizacin previa para que su compaa de seguros [place] su medicamento, por favor permtanos de 1 a 2 das hbiles para completar este proceso.  Los precios de los medicamentos varan con frecuencia dependiendo del Environmental consultant de dnde se surte la receta y alguna farmacias pueden ofrecer precios ms baratos.  El sitio web www.goodrx.com tiene cupones para medicamentos de Health and safety inspector. Los precios aqu no tienen en cuenta lo que podra costar con la ayuda del seguro (puede ser ms barato con su seguro), pero el sitio web puede darle el precio si no utiliz Tourist information centre manager.  - Puede imprimir el cupn correspondiente y  llevarlo con su receta a la farmacia.  - Tambin puede pasar por nuestra oficina durante el horario de atencin regular y Education officer, museum una tarjeta de cupones de GoodRx.  - Si necesita que su receta se enve electrnicamente a una farmacia diferente, informe a nuestra oficina a travs de MyChart de [place] o por telfono llamando al [phone] y presione la opcin 4.

## 2023-08-20 NOTE — Progress Notes (Signed)
 Follow-Up Visit   Subjective  Victoria Patterson is a 63 y.o. female who presents for the following: 3 month follow-up Onychomycosis of the R-3 toenail. She stopped using Ciclopirox  solution a couple of weeks ago since she saw improvement. She never started Fluconazole  (pt unsure if she received).  She is using ketoconazole  2% shampoo for seborrheic dermatitis, improving. She also has flaky, itchy ears.  She has Irritant Hand dermatitis from hand-washing. She used TMC cream, improved. Telogen Effluvium has improved with some hair regrowth. She is taking Vitamin D  after labs showed low.   The following portions of the chart were reviewed this encounter and updated as appropriate: medications, allergies, medical history  Review of Systems:  No other skin or systemic complaints except as noted in HPI or Assessment and Plan.  Objective  Well appearing patient in no apparent distress; mood and affect are within normal limits.  A focused examination was performed of the following areas: Face, scalp, toenails  Relevant physical exam findings are noted in the Assessment and Plan.    Assessment & Plan  SEBORRHEIC DERMATITIS Exam: Erythema/scale at ear canals, scalp clear.  Chronic and persistent condition with duration or expected duration over one year. Condition is symptomatic/ bothersome to patient. Not currently at goal.   Seborrheic Dermatitis is a chronic persistent rash characterized by pinkness and scaling most commonly of the mid face but also can occur on the scalp (dandruff), ears; mid chest, mid back and groin.  It tends to be exacerbated by stress and cooler weather.  People who have neurologic disease may experience new onset or exacerbation of existing seborrheic dermatitis.  The condition is not curable but treatable and can be controlled.  Treatment Plan: Continue Ketoconazole  2% shampoo 2-3 times a week, let sit several minutes before rinsing.     Start Dermotic Drops  apply 1-2 drops to ears once to twice daily as needed for itch dsp 20mL 5Rf.  ONYCHOMYCOSIS Exam: Nail thickening with onycholysis, 60% of nail L-3 toenail, slightly improved from baseline photos with topical treatment only   Chronic and persistent condition with duration or expected duration over one year. Condition is symptomatic/ bothersome to patient. Not currently at goal, but some improvement.   Treatment Plan:  May continue or d/c ciclopirox  solution to toenail. Patient has refills.   Start Fluconazole  200 mg take 1 tablet once a week until finished.  #30 (dispensed as once daily, but pt instructed to take once weekly - prescribed at last visit, but patient never started)   Side effects of fluconazole  (diflucan ) include nausea, diarrhea, headache, dizziness, taste changes, rare risk of irritation of the liver, allergy, or decreased blood counts (which could show up as infection or tiredness).   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 Condition is improving with treatment but not currently at goal.   Telogen effluvium is a benign, self-limited condition causing increased hair shedding usually for several months. It does not progress to baldness, and the hair eventually grows back on its own. It can be triggered by recent illness, recent surgery, thyroid disease, low iron stores, vitamin D  deficiency, fad diets or rapid weight loss, hormonal changes such as pregnancy or birth control pills, and some medication. Usually the hair loss starts 2-3 months after the illness or health change. Rarely, it can continue for longer than a year. Treatments options may  include oral or topical Minoxidil; Red Light scalp treatments; Biotin 2.5 mg daily and other options.  Treatment Plan: 02/13/23 TSH normal ,Ferritin 05/21/23 normal Labs from 05/21/23 showed Vit D is low  and she started a Vit D supplement 5,000 international units daily. Patient will continue daily. Discussed repeat labs to check Vitamin D . Patient defers today and will have checked with PCP at appt in September 2025.   Recommend healthy diet. Do not skip meals. Make sure you are eating enough protein.  Can try Vital Proteins to add more protein to diet to help hair growth. Discussed adding Viviscal hair growth vitamins.  Review with dietician.  HAND DERMATITIS, Irritant Contact Exam: Erythema with scale at MCPs, fingers.  Chronic and persistent condition with duration or expected duration over one year. Condition is bothersome/symptomatic for patient. Currently flared.   Hand Dermatitis is a chronic type of eczema that can come and go on the hands and fingers.  While there is no cure, the rash and symptoms can be managed with topical prescription medications, and for more severe cases, with systemic medications.  Recommend mild soap and routine use of moisturizing cream after handwashing.  Minimize soap/water exposure when possible.     Treatment Plan Restart Triamcinolone  0.1% cream twice a day up to 2 weeks as needed on hands for rash/itching. Patient has refills at pharmacy.   Avoid washing hands as frequently. Avoid using Soft-Soap, use more gentle soap like Dove. Recommend mild soap and moisturizing cream with hand washing. Samples given  Return in about 6 months (around 02/19/2024) for Onychomycosis, telogen effluvium, seb derm, hand derm.  IBernardine Bridegroom, CMA, am acting as scribe for Artemio Larry, MD .   Documentation: I have reviewed the above documentation for accuracy and completeness, and I agree with the above.  Artemio Larry, MD

## 2023-08-21 ENCOUNTER — Ambulatory Visit
Admission: RE | Admit: 2023-08-21 | Discharge: 2023-08-21 | Discharge status: Home / Self Care | Source: Ambulatory Visit | Attending: Internal Medicine | Admitting: Internal Medicine

## 2023-08-21 ENCOUNTER — Inpatient Hospital Stay: Admission: RE | Admit: 2023-08-21 | Source: Ambulatory Visit

## 2023-08-21 DIAGNOSIS — Z9049 Acquired absence of other specified parts of digestive tract: Secondary | ICD-10-CM | POA: Diagnosis not present

## 2023-08-21 DIAGNOSIS — R634 Abnormal weight loss: Secondary | ICD-10-CM

## 2023-08-21 DIAGNOSIS — K838 Other specified diseases of biliary tract: Secondary | ICD-10-CM | POA: Diagnosis not present

## 2023-08-21 DIAGNOSIS — I10 Essential (primary) hypertension: Secondary | ICD-10-CM

## 2023-08-21 MED ORDER — IOPAMIDOL (ISOVUE-370) INJECTION 76%
80.0000 mL | Freq: Once | INTRAVENOUS | Status: AC | PRN
  Administered 2023-08-21: 80 mL via INTRAVENOUS

## 2023-08-23 ENCOUNTER — Ambulatory Visit: Admitting: Professional Counselor

## 2023-08-23 DIAGNOSIS — F429 Obsessive-compulsive disorder, unspecified: Secondary | ICD-10-CM | POA: Diagnosis not present

## 2023-08-23 NOTE — Progress Notes (Signed)
  THERAPIST PROGRESS NOTE  Session Time: 8:03 AM - 8:50 AM   Participation Level: Active  Behavioral Response: Well Groomed, Alert, Euthymic  Type of Therapy: Individual Therapy  Treatment Goals addressed: Active Obsessions Compulsions  LTG: To be able to get rid of the gloves and the part with the germs. That's the most overwhelming part to me. I can't go to the beach because I can't sleep in the beds.    Start:  08/23/23    Expected End:  08/21/24     STG: Danetra will reduce time involved with obsessions and compulsions AEB self-report.    STG: Majesta will understand underlying sources of obsessions and/or compulsions AEB evidence of restructured thinking around OCD thinking/behaviors   STG: Amyla will reduce anxiety around driving AEB driving longer distances and riding on the highway.    ProgressTowards Goals: Initial  Interventions: Motivational Interviewing and Supportive  Summary: [name] is a 63 y.o. female who presents with a history of bipolar disorder, claustrophobia, and OCD. She appeared alert and oriented x5. She provided updates since her initial CCA, including her son's admissions to [facility] in the fall and some health concerns. She has been unable to turn her dog over to foster care and is hoping she can work up to this or get him trained. [name] shared more information about her previous therapy work. She engaged in developing her treatment plan. She was receptive to homework assignment.   Therapist Response: Conducted session with Anner. Began session with check-in/update since previous session. Utilized empathetic and reflective listening. Used open-ended questions to facilitate discussion and summarized [name]'s thoughts/feelings. Developed treatment plan with input from [name] on current strengths, needs, and progress towards goals. Assigned homework to create exposure hierarchy. Scheduled additional appointment and concluded session.    Suicidal/Homicidal: No  Plan: Return again in 2 weeks.  Diagnosis: Obsessive-compulsive disorder, unspecified type  Collaboration of Care: Medication Management AEB chart review  Patient/Guardian was advised Release of Information must be obtained prior to any record release in order to collaborate their care with an outside provider. Patient/Guardian was advised if they have not already done so to contact the registration department to sign all necessary forms in order for us  to release information regarding their care.   Consent: Patient/Guardian gives verbal consent for treatment and assignment of benefits for services provided during this visit. Patient/Guardian expressed understanding and agreed to proceed.   [name], [facility] 08/23/2023
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
## 2023-08-27 ENCOUNTER — Other Ambulatory Visit: Payer: Self-pay

## 2023-08-27 ENCOUNTER — Ambulatory Visit: Admitting: Psychiatry

## 2023-08-27 ENCOUNTER — Encounter: Payer: Self-pay | Admitting: Psychiatry

## 2023-08-27 VITALS — BP 108/71 | HR 62 | Temp 97.4°F | Ht 63.0 in | Wt 151.0 lb

## 2023-08-27 DIAGNOSIS — F429 Obsessive-compulsive disorder, unspecified: Secondary | ICD-10-CM

## 2023-08-27 DIAGNOSIS — F3181 Bipolar II disorder: Secondary | ICD-10-CM

## 2023-08-27 MED ORDER — LAMOTRIGINE 200 MG PO TABS: 200.0000 mg | ORAL_TABLET | Freq: Every day | ORAL | 1 refills | Status: AC
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
## 2023-08-27 NOTE — Patient Instructions (Signed)
 Continue duloxetine  30 mg daily  Continue lamotrigine  200 mg daily Next appointment: 8/11 at 8:30

## 2023-08-31 ENCOUNTER — Other Ambulatory Visit: Payer: Self-pay | Admitting: Internal Medicine

## 2023-08-31 DIAGNOSIS — K838 Other specified diseases of biliary tract: Secondary | ICD-10-CM

## 2023-08-31 DIAGNOSIS — R634 Abnormal weight loss: Secondary | ICD-10-CM

## 2023-09-03 ENCOUNTER — Ambulatory Visit (INDEPENDENT_AMBULATORY_CARE_PROVIDER_SITE_OTHER): Admitting: Professional Counselor

## 2023-09-03 DIAGNOSIS — F429 Obsessive-compulsive disorder, unspecified: Secondary | ICD-10-CM | POA: Diagnosis not present

## 2023-09-03 NOTE — Progress Notes (Signed)
 THERAPIST PROGRESS NOTE  Virtual Visit via Video Note  I connected with Sarit A Falin on 09/03/23 at  1:00 PM EDT by a video enabled telemedicine application and verified that I am speaking with the correct person using two identifiers.  Location: Patient: Home Provider: Office   I discussed the limitations of evaluation and management by telemedicine and the availability of in person appointments. The patient expressed understanding and agreed to proceed.  I discussed the assessment and treatment plan with the patient. The patient was provided an opportunity to ask questions and all were answered. The patient agreed with the plan and demonstrated an understanding of the instructions.   The patient was advised to call back or seek an in-person evaluation if the symptoms worsen or if the condition fails to improve as anticipated.  I provided 58 minutes of non-face-to-face time during this encounter. [name], [facility]  Session Time: 1:02 PM - 2:00 PM  Participation Level: Active  Behavioral Response: Well Groomed, Alert, Anxious  Type of Therapy: Individual Therapy  Treatment Goals addressed:  Active Obsessions Compulsions  LTG: To be able to get rid of the gloves and the part with the germs. That's the most overwhelming part to me. I can't go to the beach because I can't sleep in the beds.                Start:  08/23/23    Expected End:  08/21/24      STG: Jode will reduce time involved with obsessions and compulsions AEB self-report.     STG: Janya will understand underlying sources of obsessions and/or compulsions AEB evidence of restructured thinking around OCD thinking/behaviors    STG: Shaheen will reduce anxiety around driving AEB driving longer distances and riding on the highway.    ProgressTowards Goals: Progressing  Interventions: CBT, Motivational Interviewing, and Supportive  Summary: NYASIA BAXLEY is a 63 y.o. female who presents with a history of  bipolar disorder, claustrophobia, and OCD. She appeared alert and oriented x5. Kaymarie reported she hurt her knee while walking and needed to switch to virtual since she doesn't ride elevators. She reported her doctor ordered a MRI due to possible concerns. She noted she is still losing weight without trying. Sitlali completed her homework assignment and shared the hierarchy (germs, claustrophobia, finances, being unable to control things, running out of items). She engaged in writing exercise to identify things within and outside of her control. Sagan was receptive to coping skills to help with grounding/regulation.   Therapist Response: Conducted session with Jayli. Began session with check-in/update since previous session. Utilized empathetic and reflective listening. Used open-ended questions to facilitate discussion and gain more knowledge about Tymira's homework/hierarchy. Engaged Lainee in writing exercise - donut/circles of control. Assisted with identifying things within and outside of her control. Explained coping skills for regulation (breathing exercises, 5-4-3-2-1, and TIP skill). Encouraged Aubriee to practice consistently. Scheduled additional appointment and concluded session.   Suicidal/Homicidal: No  Plan: Return again in 2 weeks.  Diagnosis: Obsessive-compulsive disorder, unspecified type  Collaboration of Care: Medication Management AEB chart review  Patient/Guardian was advised Release of Information must be obtained prior to any record release in order to collaborate their care with an outside provider. Patient/Guardian was advised if they have not already done so to contact the registration department to sign all necessary forms in order for us  to release information regarding their care.   Consent: Patient/Guardian gives verbal consent for treatment and assignment of benefits  for services provided during this visit. Patient/Guardian expressed understanding and agreed to proceed.    [name], [facility] 09/03/2023

## 2023-09-06 ENCOUNTER — Ambulatory Visit
Admission: RE | Admit: 2023-09-06 | Discharge: 2023-09-06 | Disposition: A | Discharge status: Home / Self Care | Source: Ambulatory Visit | Attending: Internal Medicine | Admitting: Internal Medicine

## 2023-09-06 DIAGNOSIS — K838 Other specified diseases of biliary tract: Secondary | ICD-10-CM

## 2023-09-06 DIAGNOSIS — R634 Abnormal weight loss: Secondary | ICD-10-CM

## 2023-09-10 ENCOUNTER — Telehealth: Payer: Self-pay

## 2023-09-10 NOTE — Telephone Encounter (Signed)
 pt left a message that she i going to have a MRI tomorrow and that Dr. Margurette gave her a diazepam and she wanted to make sure that it will not mess up her current medications.   Pt last seen on 6-16 next appt 8-11

## 2023-09-10 NOTE — Telephone Encounter (Signed)
 pt was notified she said thank you.

## 2023-09-10 NOTE — Telephone Encounter (Signed)
 Thank you for reaching out. Yes, it is fine to take diazepam along with her other medications.

## 2023-09-12 ENCOUNTER — Ambulatory Visit: Admission: RE | Admit: 2023-09-12 | Source: Ambulatory Visit

## 2023-09-13 ENCOUNTER — Ambulatory Visit

## 2023-09-17 ENCOUNTER — Ambulatory Visit (INDEPENDENT_AMBULATORY_CARE_PROVIDER_SITE_OTHER): Admitting: Professional Counselor

## 2023-09-17 DIAGNOSIS — F3181 Bipolar II disorder: Secondary | ICD-10-CM | POA: Diagnosis not present

## 2023-09-17 DIAGNOSIS — F429 Obsessive-compulsive disorder, unspecified: Secondary | ICD-10-CM | POA: Diagnosis not present

## 2023-09-17 DIAGNOSIS — F40240 Claustrophobia: Secondary | ICD-10-CM

## 2023-09-17 NOTE — Progress Notes (Signed)
 THERAPIST PROGRESS NOTE  Virtual Visit via Video Note  I connected with Victoria Patterson on 09/17/23 at  1:00 PM EDT by a video enabled telemedicine application and verified that I am speaking with the correct person using two identifiers.  Location: Patient: Home Provider: Office   I discussed the limitations of evaluation and management by telemedicine and the availability of in person appointments. The patient expressed understanding and agreed to proceed.   I discussed the assessment and treatment plan with the patient. The patient was provided an opportunity to ask questions and all were answered. The patient agreed with the plan and demonstrated an understanding of the instructions.   The patient was advised to call back or seek an in-person evaluation if the symptoms worsen or if the condition fails to improve as anticipated.  I provided 48 minutes of non-face-to-face time during this encounter. Victoria Patterson, [facility]  Session Time: 1:05 PM - 1:53 PM  Participation Level: Active  Behavioral Response: Casual, Alert, Anxious  Type of Therapy: Individual Therapy  Treatment Goals addressed: Active Obsessions Compulsions  LTG: To be able to get rid of the gloves and the part with the germs. That's the most overwhelming part to me. I can't go to the beach because I can't sleep in the beds.                Start:  08/23/23    Expected End:  08/21/24      STG: Victoria Patterson will reduce time involved with obsessions and compulsions AEB self-report.     STG: Victoria Patterson will understand underlying sources of obsessions and/or compulsions AEB evidence of restructured thinking around OCD thinking/behaviors    STG: Victoria Patterson will reduce anxiety around driving AEB driving longer distances and riding on the highway.  ProgressTowards Goals: Progressing  Interventions: CBT, Supportive, and Other: ERP  Summary: Victoria Patterson is a 62 y.o. female who presents with a history of bipolar disorder,  claustrophobia, and OCD. She appeared anxious but oriented x5. She reported she needs an MRI to confirm possible health issues but twice she's been unable to follow thru with the test. Victoria Patterson engaged in completing ABC worksheet, identifying thoughts and challenging them. She was receptive to Socratic questioning. She was able to identify alternate thoughts/affirmations to help build new beliefs. Victoria Patterson also engaged in exposure exercise, standing in a closet. She reported it wasn't very stressful but it would be worse if someone was holding the door closed. She noted her son might be able to help her practice this. Victoria Patterson engaged in review and practice of breathing exercises. She hopes this will be helpful. She also has medication (Valium) her doctor prescribed but she was scared to take it before. She will try with a half of the pill to see if that is helpful as well.   Therapist Response: Conducted session with Victoria Patterson. Began session with check-in/update since previous session. Utilized empathetic and reflective listening. Used open-ended questions to facilitate discussion and summarized thoughts/feelings. Explained ABC model and completed worksheet with MRI scenario. Reviewed coping skills and breathing exercises for anxiety/panic. Explained urge surfing. Engaged in exposure exercise - standing in closet. Scheduled additional appointment and concluded session.   Suicidal/Homicidal: No  Plan: Return again in 2 weeks.  Diagnosis: Obsessive-compulsive disorder, unspecified type  Bipolar II disorder (HCC)  Claustrophobia  Collaboration of Care: Medication Management AEB chart review  Patient/Guardian was advised Release of Information must be obtained prior to any record release in order to collaborate their  care with an outside provider. Patient/Guardian was advised if they have not already done so to contact the registration department to sign all necessary forms in order for us  to release information  regarding their care.   Consent: Patient/Guardian gives verbal consent for treatment and assignment of benefits for services provided during this visit. Patient/Guardian expressed understanding and agreed to proceed.   Victoria Patterson, [facility] 09/17/2023

## 2023-09-18 ENCOUNTER — Ambulatory Visit
Admission: RE | Admit: 2023-09-18 | Discharge: 2023-09-18 | Disposition: A | Discharge status: Home / Self Care | Source: Ambulatory Visit | Attending: Internal Medicine | Admitting: Internal Medicine

## 2023-09-18 ENCOUNTER — Other Ambulatory Visit: Payer: Self-pay | Admitting: Internal Medicine

## 2023-09-18 DIAGNOSIS — Z9049 Acquired absence of other specified parts of digestive tract: Secondary | ICD-10-CM | POA: Diagnosis not present

## 2023-09-18 DIAGNOSIS — R634 Abnormal weight loss: Secondary | ICD-10-CM | POA: Diagnosis not present

## 2023-09-18 DIAGNOSIS — K838 Other specified diseases of biliary tract: Secondary | ICD-10-CM | POA: Insufficient documentation

## 2023-09-18 MED ORDER — GADOBUTROL 1 MMOL/ML IV SOLN
7.5000 mL | Freq: Once | INTRAVENOUS | Status: AC | PRN
  Administered 2023-09-18: 7.5 mL via INTRAVENOUS

## 2023-09-28 ENCOUNTER — Ambulatory Visit: Admitting: Dietician

## 2023-10-02 ENCOUNTER — Ambulatory Visit (INDEPENDENT_AMBULATORY_CARE_PROVIDER_SITE_OTHER): Admitting: Professional Counselor

## 2023-10-02 DIAGNOSIS — F40240 Claustrophobia: Secondary | ICD-10-CM

## 2023-10-02 DIAGNOSIS — F429 Obsessive-compulsive disorder, unspecified: Secondary | ICD-10-CM | POA: Diagnosis not present

## 2023-10-02 DIAGNOSIS — F3181 Bipolar II disorder: Secondary | ICD-10-CM

## 2023-10-02 NOTE — Progress Notes (Signed)
  THERAPIST PROGRESS NOTE  Session Time: 10:00 AM - 10:55 AM  Participation Level: Active  Behavioral Response: Well Groomed, Alert, Anxious  Type of Therapy: Individual Therapy  Treatment Goals addressed: Active Obsessions Compulsions  LTG: To be able to get rid of the gloves and the part with the germs. That's the most overwhelming part to me. I can't go to the beach because I can't sleep in the beds.                Start:  08/23/23    Expected End:  08/21/24      STG: Eldena will reduce time involved with obsessions and compulsions AEB self-report.     STG: Karl will understand underlying sources of obsessions and/or compulsions AEB evidence of restructured thinking around OCD thinking/behaviors    STG: Mykayla will reduce anxiety around driving AEB driving longer distances and riding on the highway.  ProgressTowards Goals: Progressing  Interventions: Motivational Interviewing, Supportive, and Other: ERP  Summary: ANNAJULIA LEWING is a 63 y.o. female who presents with a history of bipolar disorder, claustrophobia, and OCD. She appeared alert and oriented x5. She stated she was able to get her MRI completed but feels the hospital employee helped because she stayed in the room with her and left her hands on her leg. Maryuri identified a fear of feeling trapped and was able to identify other situations she has experienced this, including her first marriage. She discussed updates with her son and dog. She noted additional worries fears about her son going to college next month. Ridhima appeared receptive to focusing on the facts other what if scenarios. She reviewed exposure hierarchy and helped identify tasks to begin practicing exposure with.   Therapist Response: Conducted session with Lysandra. Began session with check-in/update since previous session. Utilized empathetic and reflective listening. Used open-ended questions to facilitate discussion and summarized thoughts/feelings. Used  Socratic questioning and check the facts to assist with emotion regulation. Reviewed exposure hierarchy and identified exposure exercises for Pearlena to practice at home. Shared plan on exposure exercises to practice in the office next session. Scheduled additional appointment and concluded session.   Suicidal/Homicidal: No  Plan: Return again in 2 weeks.  Diagnosis: Obsessive-compulsive disorder, unspecified type  Bipolar II disorder (HCC)  Claustrophobia  Collaboration of Care: Medication Management AEB chart review  Patient/Guardian was advised Release of Information must be obtained prior to any record release in order to collaborate their care with an outside provider. Patient/Guardian was advised if they have not already done so to contact the registration department to sign all necessary forms in order for us  to release information regarding their care.   Consent: Patient/Guardian gives verbal consent for treatment and assignment of benefits for services provided during this visit. Patient/Guardian expressed understanding and agreed to proceed.   [name], [facility] 10/02/2023

## 2023-10-09 ENCOUNTER — Other Ambulatory Visit: Payer: Self-pay | Admitting: Psychiatry

## 2023-10-17 NOTE — Progress Notes (Deleted)
 BH MD/PA/NP OP Progress Note  10/17/2023 5:19 PM Victoria Patterson  MRN:  [id number]  Chief Complaint: No chief complaint on file.  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  She was spanked every day.  She remembers it vividly, although she denies any PTSD symptoms.  Her father was alcoholic, and died from toxic shock syndrome (he had lung cancer)  Visit Diagnosis: No diagnosis found.  Past Psychiatric History: Please see initial evaluation for full details. I have reviewed the history. No updates at this time.     Past Medical History:  Past Medical History:  Diagnosis Date   ADHD (attention deficit hyperactivity disorder)    Anxiety    Arthritis    Asthma    controlled for many years   Depression    Diabetes mellitus, type II (HCC)    Fatty liver    Hypertension    Hypothyroidism    Obsessive-compulsive disorder     Past Surgical History:  Procedure Laterality Date   BREAST CYST ASPIRATION Left 1992   neg   CHOLECYSTECTOMY     dilation and evacuation     x2   HYSTEROSCOPY WITH D & C N/A 02/04/2018   Procedure: DILATATION AND CURETTAGE LELDON, fractional;  Surgeon: Schermerhorn, Debby PARAS, MD;  Location: [facility] ORS;  Service: Gynecology;  Laterality: N/A;    Family Psychiatric History: Please see initial evaluation for full details. I have reviewed the history. No updates at this time.     Family History:  Family History  Problem Relation Age of Onset   Ovarian cancer Mother 5   Bipolar disorder Mother    Breast cancer Paternal Aunt 1   Breast cancer Cousin        maternal   Alcohol abuse Father     Social History:  Social History    Socioeconomic History   Marital status: Married    Spouse name: michael   Number of children: 1   Years of education: Not on file   Highest education level: Master's degree (e.g., MA, MS, MEng, MEd, MSW, MBA)  Occupational History   Not on file  Tobacco Use   Smoking status: Never    Passive exposure: Never   Smokeless tobacco: Never  Vaping Use   Vaping status: Never Used  Substance and Sexual Activity   Alcohol use: No   Drug use: No   Sexual activity: Not Currently  Other Topics Concern   Not on file  Social History Narrative   Not on file   Social Drivers of Health   Financial Resource Strain: Medium Risk (06/05/2023)   Overall Financial Resource Strain (CARDIA)    Difficulty of Paying Living Expenses: Somewhat hard  Food Insecurity: No Food Insecurity (06/05/2023)   Hunger Vital Sign    Worried About Running Out of Food in the Last Year: Never true    Ran Out of Food in the Last Year: Never true  Transportation Needs: No Transportation Needs (06/05/2023)   PRAPARE - Administrator, Civil Service (Medical): No    Lack of Transportation (Non-Medical): No  Physical Activity: Insufficiently Active (06/05/2023)   Exercise Vital Sign  Days of Exercise per Week: 4 days    Minutes of Exercise per Session: 30 min  Stress: Stress Concern Present (06/05/2023)   Victoria Patterson [employer] - Occupational Stress Questionnaire    Feeling of Stress : Very much  Social Connections: Socially Isolated (06/05/2023)   Social Connection and Isolation Panel    Frequency of Communication with Friends and Family: Once a week    Frequency of Social Gatherings with Friends and Family: Never    Attends Religious Services: Never    Database administrator or Organizations: No    Attends Banker Meetings: Never    Marital Status: Married    Allergies:  Allergies  Allergen Reactions   Bactrim [Sulfamethoxazole-Trimethoprim] Other (See Comments)     hypoglycemia   Ciprofloxacin  Other (See Comments)    She experienced hypertension and pedal edema which caused her to seek treatment in the ED.   Penicillins Hives    Has patient had a PCN reaction causing immediate rash, facial/tongue/throat swelling, SOB or lightheadedness with hypotension: No Has patient had a PCN reaction causing severe rash involving mucus membranes or skin necrosis: Yes Has patient had a PCN reaction that required hospitalization: No Has patient had a PCN reaction occurring within the last 10 years: No If all of the above answers are NO, then may proceed with Cephalosporin use.    Stadol [Butorphanol] Anxiety    Metabolic Disorder Labs: Lab Results  Component Value Date   HGBA1C 5.5 10/03/2022   MPG 111.15 10/03/2022   No results found for: PROLACTIN No results found for: CHOL, TRIG, HDL, CHOLHDL, VLDL, LDLCALC No results found for: TSH  Therapeutic Level Labs: No results found for: LITHIUM No results found for: VALPROATE No results found for: CBMZ  Current Medications: Current Outpatient Medications  Medication Sig Dispense Refill   Accu-Chek Softclix Lancets lancets 1 each 3 (three) times daily.     allopurinol  (ZYLOPRIM ) 300 MG tablet Take 300 mg by mouth daily.     ciclopirox  (PENLAC ) 8 % solution Apply over nail and surrounding skin every night at bedtime. Apply daily over previous coat. After seven (7) days, may remove with alcohol and continue cycle. 6.6 mL 11   clotrimazole (LOTRIMIN) 1 % external solution      conjugated estrogens  (PREMARIN ) vaginal cream Apply 0.5mg  (pea-sized amount)  just inside the vaginal introitus with a finger-tip on  Monday, Wednesday and Friday nights. 30 g 12   Cranberry (RA CRANBERRY) 500 MG CAPS Take by mouth once.     DULoxetine  (CYMBALTA ) 30 MG capsule Take 1 capsule (30 mg total) by mouth daily. 90 capsule 0   ezetimibe  (ZETIA ) 10 MG tablet Take 10 mg by mouth daily.     fluconazole   (DIFLUCAN ) 200 MG tablet Take 1 tablet once daily until finished. 30 tablet 0   Fluocinolone  Acetonide 0.01 % OIL Apply 1-2 drops to ears once to twice daily as needed for itch. 20 mL 5   ketoconazole  (NIZORAL ) 2 % shampoo 2-3 times a week lather on scalp, leave on 5-8 minutes, rinse out 120 mL 11   lamoTRIgine  (LAMICTAL ) 200 MG tablet Take 1 tablet (200 mg total) by mouth daily. 90 tablet 1   levothyroxine  (SYNTHROID , LEVOTHROID) 75 MCG tablet Take 75 mcg by mouth daily before breakfast.     PRECISION QID TEST test strip 2 (two) times daily at 10 AM and 5 PM.     Prenatal Vit-Fe Fumarate-FA (PRENATAL 19 PO) Take by mouth.  triamcinolone  cream (KENALOG ) 0.1 % Apply twice daily to hands up to 2 weeks as needed for rash/itching. Avoid applying to face, groin, and axilla. 30 g 2   No current facility-administered medications for this visit.     Musculoskeletal: Strength & Muscle Tone: within normal limits Gait & Station: normal Patient leans: N/A  Psychiatric Specialty Exam: Review of Systems  There were no vitals taken for this visit.There is no height or weight on file to calculate BMI.  General Appearance: {Appearance:22683}  Eye Contact:  {BHH EYE CONTACT:22684}  Speech:  Clear and Coherent  Volume:  Normal  Mood:  {BHH MOOD:22306}  Affect:  {Affect (PAA):22687}  Thought Process:  Coherent  Orientation:  Full (Time, Place, and Person)  Thought Content: Logical   Suicidal Thoughts:  {ST/HT (PAA):22692}  Homicidal Thoughts:  {ST/HT (PAA):22692}  Memory:  Immediate;   Good  Judgement:  {Judgement (PAA):22694}  Insight:  {Insight (PAA):22695}  Psychomotor Activity:  Normal  Concentration:  Concentration: Good and Attention Span: Good  Recall:  Good  Fund of Knowledge: Good  Language: Good  Akathisia:  No  Handed:  Right  AIMS (if indicated): not done  Assets:  Communication Skills Desire for Improvement  ADL's:  Intact  Cognition: WNL  Sleep:  {BHH GOOD/FAIR/POOR:22877}    Screenings: GAD-7    Advertising copywriter from 06/05/2023 in [facility] Visit from 04/16/2023 in [facility] Visit from 02/22/2023 in [facility] Visit from 10/10/2022 in [facility] Visit from 06/08/2022 in [facility]  Total GAD-7 Score 13 11 16 14 14    PHQ2-9    Flowsheet Row Counselor from 06/05/2023 in [facility] Visit from 04/16/2023 in [facility] Visit from 02[facility]22/2023 in [facility] Visit from 10/10/2022 in [facility] Office Visit from 06/08/2022 in [facility]  PHQ-2 Total Score 4 2 4 4 5   PHQ-9 Total Score 13 10 16 15 16    Flowsheet Row Counselor from 06/05/2023 in [facility] Office Visit from 04/16/2023 in [facility] ED from 11/05/2022 in [facility] Emergency Department at [facility]  C-SSRS RISK CATEGORY Low Risk No Risk No Risk     Assessment and Plan:  KAMEKA WHAN is a 64 y.o. year old female with a history of bipolar II disorder, OCD (germ phobia), ADHD, history of eating disorder both anorexia and bulimia,  hypertension, diabetes, gout, hypothyroidism. bilateral primary. The patient presents for follow up appointment for below.     1. Obsessive-compulsive disorder, unspecified type 2. Bipolar II disorder (HCC) Acute stressors include:  financial strain Other stressors include: unemployment since Sept 2022 (on disability), absence of nurturing from her mother/financial strain when she was a child     History: seen by Dr. Chipper for many  years, subthreshold hypomanic symptoms in response to her husband's infidelity. Originally on duloxetine  30 mg daily, lamotrigine  200 mg daily The exam is notable for bright affect.  She continues to experience OCD symptoms  (repetitive handwashing due to germ phobia, and obsessive thoughts, being worried about her son) , although her depressive symptoms are manageable since the previous visit.  Although the transition from duloxetine  to venlafaxine  was frequently discussed and a prescription was  provided for cross-tapering, she ultimately remained on duloxetine  without attempting the switch due to concerns about a potential worsening of her mood symptoms.  Will respect her preference for now, especially given she has started a therapy.  Will continue current dose of duloxetine  to target OCD, depression.  Will continue lamotrigine  for bipolar disorder.    3. Insomnia, unspecified type Stable.  She reports good benefit from light therapy during the winter season.  Previously discussed referral for evaluation of sleep apnea. She expressed  understanding to discuss this when she is interested.       # weight loss She is followed by Dr. Auston.  Recent abdominal CT without any significant abnormality.  She continues to have weight loss despite overall stable mood despite chronic OCD symptoms.  She has an upcoming follow-up.  Will continue to monitor this.    Plan Continue duloxetine  30 mg daily  Continue lamotrigine  200 mg daily Next appointment: 8/11 at 8:30, IP      Past trials of medication: fluoxetine, sertraline, lexapro, Vraylar, Ability,   The patient demonstrates the following risk factors for suicide: Chronic risk factors for suicide include: psychiatric disorder of OCD, bipolar disorder . Acute risk factors for suicide include: unemployment. Protective factors for this patient include: positive social support, responsibility to others (children, family), coping skills, and hope for the future.  Considering these factors, the overall suicide risk at this point appears to be low. Patient is appropriate for outpatient follow up.     Collaboration of Care: Collaboration of Care: {BH OP Collaboration of Care:21014065}  Patient/Guardian was advised Release of Information must be obtained prior to any record release in order to collaborate their care with an outside provider. Patient/Guardian was advised if they have not already done so to contact the registration department to sign all necessary forms in order for us  to release information regarding their care.   Consent: Patient/Guardian gives verbal consent for treatment and assignment of benefits for services provided during this visit. Patient/Guardian expressed understanding and agreed to proceed.    Katheren Sleet, MD 10/17/2023, 5:19 PM

## 2023-10-18 ENCOUNTER — Ambulatory Visit: Admitting: Professional Counselor

## 2023-10-22 ENCOUNTER — Ambulatory Visit: Admitting: Psychiatry

## 2023-10-23 DIAGNOSIS — E039 Hypothyroidism, unspecified: Secondary | ICD-10-CM | POA: Diagnosis not present

## 2023-10-23 DIAGNOSIS — E782 Mixed hyperlipidemia: Secondary | ICD-10-CM | POA: Diagnosis not present

## 2023-10-23 DIAGNOSIS — Z79899 Other long term (current) drug therapy: Secondary | ICD-10-CM | POA: Diagnosis not present

## 2023-10-30 ENCOUNTER — Ambulatory Visit (INDEPENDENT_AMBULATORY_CARE_PROVIDER_SITE_OTHER): Admitting: Professional Counselor

## 2023-10-30 DIAGNOSIS — E079 Disorder of thyroid, unspecified: Secondary | ICD-10-CM | POA: Diagnosis not present

## 2023-10-30 DIAGNOSIS — I10 Essential (primary) hypertension: Secondary | ICD-10-CM | POA: Diagnosis not present

## 2023-10-30 DIAGNOSIS — F429 Obsessive-compulsive disorder, unspecified: Secondary | ICD-10-CM | POA: Diagnosis not present

## 2023-10-30 DIAGNOSIS — F40240 Claustrophobia: Secondary | ICD-10-CM

## 2023-10-30 DIAGNOSIS — Z0000 Encounter for general adult medical examination without abnormal findings: Secondary | ICD-10-CM | POA: Diagnosis not present

## 2023-10-30 DIAGNOSIS — F3181 Bipolar II disorder: Secondary | ICD-10-CM | POA: Diagnosis not present

## 2023-10-30 DIAGNOSIS — E119 Type 2 diabetes mellitus without complications: Secondary | ICD-10-CM | POA: Diagnosis not present

## 2023-10-30 DIAGNOSIS — E785 Hyperlipidemia, unspecified: Secondary | ICD-10-CM | POA: Diagnosis not present

## 2023-10-30 NOTE — Progress Notes (Signed)
  THERAPIST PROGRESS NOTE  Session Time: 9:00 - 10:01 AM   Participation Level: Active  Behavioral Response: Well Groomed, Alert, Anxious  Type of Therapy: Individual Therapy  Treatment Goals addressed: Active Obsessions Compulsions  LTG: To be able to get rid of the gloves and the part with the germs. That's the most overwhelming part to me. I can't go to the beach because I can't sleep in the beds.                Start:  08/23/23    Expected End:  08/21/24      STG: Calise will reduce time involved with obsessions and compulsions AEB self-report.     STG: Shrika will understand underlying sources of obsessions and/or compulsions AEB evidence of restructured thinking around OCD thinking/behaviors    STG: Terez will reduce anxiety around driving AEB driving longer distances and riding on the highway.  ProgressTowards Goals: Progressing  Interventions: CBT, Motivational Interviewing, Supportive, and Other: ERP  Summary: ALEGRA ROST is a 63 y.o. female who presents with a history of bipolar disorder, claustrophobia, and OCD. She appeared anxious but oriented x5. She reported they moved her son into college this past weekend. Merian discussed the events and the impact it has had on her mental health. She was able to obtain a sedative from her PCP to help during this time. Kassiah reported prior to him leaving, she was able to practice EPR exercises and shared her experiences. There were some exercises she was unable to do. Lamya was receptive to coping skills to reduce her anxiety. She was given additional ERP practice sheets and ABC worksheets to help challenge her thoughts. She was receptive to natural vs manufactured emotions. Lexie appeared responsive to this writer's self-disclosure about experiencing similar emotions with dropping her son off to college.   Therapist Response: Conducted session with Jaden. Began session with check-in/update since previous session. Utilized empathetic and  reflective listening. Used open-ended questions to facilitate discussion and summarized Timberlyn's thoughts/feelings. Reviewed coping skills to reduce anxiety (TIP, breathing exercises, 5-4-3-2-1, and urge surfing). Discussed Brentney's ERP practice and provided additional worksheets for practice. Explained ABC worksheets again and modeled with current thoughts Janika has about son/college. Noted difference between natural vs manufactured emotions and provided self-disclosure about my son moving into college. Scheduled additional appointment and concluded session.   Suicidal/Homicidal: No  Plan: Return again in 2 weeks.  Diagnosis: Obsessive-compulsive disorder, unspecified type  Bipolar II disorder (HCC)  Claustrophobia  Collaboration of Care: Medication Management AEB chart review  Patient/Guardian was advised Release of Information must be obtained prior to any record release in order to collaborate their care with an outside provider. Patient/Guardian was advised if they have not already done so to contact the registration department to sign all necessary forms in order for us  to release information regarding their care.   Consent: Patient/Guardian gives verbal consent for treatment and assignment of benefits for services provided during this visit. Patient/Guardian expressed understanding and agreed to proceed.   [name], [facility] 10/30/2023

## 2023-10-31 ENCOUNTER — Telehealth (INDEPENDENT_AMBULATORY_CARE_PROVIDER_SITE_OTHER): Admitting: Psychiatry

## 2023-10-31 ENCOUNTER — Encounter: Payer: Self-pay | Admitting: Psychiatry

## 2023-10-31 DIAGNOSIS — F3181 Bipolar II disorder: Secondary | ICD-10-CM

## 2023-10-31 DIAGNOSIS — F429 Obsessive-compulsive disorder, unspecified: Secondary | ICD-10-CM

## 2023-10-31 DIAGNOSIS — F410 Panic disorder [episodic paroxysmal anxiety] without agoraphobia: Secondary | ICD-10-CM

## 2023-10-31 MED ORDER — DULOXETINE HCL 60 MG PO CPEP: 60.0000 mg | ORAL_CAPSULE | Freq: Every day | ORAL | 0 refills | Status: DC

## 2023-10-31 MED ORDER — DULOXETINE HCL 20 MG PO CPEP: 40.0000 mg | ORAL_CAPSULE | Freq: Every day | ORAL | 0 refills | Status: DC

## 2023-10-31 NOTE — Patient Instructions (Signed)
 Increase duloxetine  40 mg daily for 2 weeks, then 60 mg daily Continue lamotrigine  200 mg daily Next appointment: 9/25 at 4 pm
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
## 2023-11-06 ENCOUNTER — Ambulatory Visit: Admitting: Dietician

## 2023-11-11 ENCOUNTER — Other Ambulatory Visit: Payer: Self-pay | Admitting: Psychiatry

## 2023-11-14 ENCOUNTER — Ambulatory Visit: Admitting: Professional Counselor

## 2023-11-15 ENCOUNTER — Ambulatory Visit: Admitting: Psychiatry

## 2023-11-15 ENCOUNTER — Encounter: Payer: Self-pay | Admitting: Psychiatry

## 2023-11-15 VITALS — BP 110/68 | HR 74 | Temp 98.1°F | Ht 63.0 in | Wt 143.0 lb

## 2023-11-15 DIAGNOSIS — G4700 Insomnia, unspecified: Secondary | ICD-10-CM

## 2023-11-15 DIAGNOSIS — F410 Panic disorder [episodic paroxysmal anxiety] without agoraphobia: Secondary | ICD-10-CM

## 2023-11-15 DIAGNOSIS — F429 Obsessive-compulsive disorder, unspecified: Secondary | ICD-10-CM

## 2023-11-15 DIAGNOSIS — F3181 Bipolar II disorder: Secondary | ICD-10-CM

## 2023-11-15 NOTE — Patient Instructions (Signed)
 Increase duloxetine  40 mg daily for 2 weeks, then 60 mg daily Continue lamotrigine  200 mg daily Next appointment: 9/25 at 4 pm
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
## 2023-11-21 ENCOUNTER — Ambulatory Visit: Admitting: Professional Counselor

## 2023-11-21 ENCOUNTER — Telehealth: Payer: Self-pay

## 2023-11-21 ENCOUNTER — Other Ambulatory Visit: Payer: Self-pay | Admitting: Psychiatry

## 2023-11-21 DIAGNOSIS — F3181 Bipolar II disorder: Secondary | ICD-10-CM

## 2023-11-21 DIAGNOSIS — F410 Panic disorder [episodic paroxysmal anxiety] without agoraphobia: Secondary | ICD-10-CM

## 2023-11-21 DIAGNOSIS — F40240 Claustrophobia: Secondary | ICD-10-CM

## 2023-11-21 DIAGNOSIS — F429 Obsessive-compulsive disorder, unspecified: Secondary | ICD-10-CM | POA: Diagnosis not present

## 2023-11-21 MED ORDER — DULOXETINE HCL 20 MG PO CPEP: 40.0000 mg | ORAL_CAPSULE | Freq: Every day | ORAL | 0 refills | Status: DC

## 2023-11-21 NOTE — Progress Notes (Unsigned)
  THERAPIST PROGRESS NOTE  Session Time: 9:05 AM - 9:50 AM   Participation Level: Active  Behavioral Response: Well Groomed, Alert, Anxious  Type of Therapy: Individual Therapy  Treatment Goals addressed: Active Obsessions Compulsions  LTG: To be able to get rid of the gloves and the part with the germs. That's the most overwhelming part to me. I can't go to the beach because I can't sleep in the beds.                Start:  08/23/23    Expected End:  08/21/24      STG: Agueda will reduce time involved with obsessions and compulsions AEB self-report.     STG: Anyla will understand underlying sources of obsessions and/or compulsions AEB evidence of restructured thinking around OCD thinking/behaviors    STG: Evany will reduce anxiety around driving AEB driving longer distances and riding on the highway.  ProgressTowards Goals: Progressing  Interventions: CBT, DBT, and Supportive  Summary: [name] is a 63 y.o. female who presents with a history of bipolar disorder, claustrophobia, and OCD. She appeared anxious but oriented x5. She reported she really struggled the last couple of weeks. Her son came home for Labor Day weekend and when he returned to school her anxiety and panic spiraled out of control. She was approved for an increase in her medication but struggled to take the new dose. Leona was able to finally take the increased dosage and reported it was helpful. She also reported coping skills that were helpful. Shajuan was receptive to additional coping skills that might benefit her. She engaged in discussion to reframe negative thoughts (catastrophizing) and checking the facts to help reduce anxiety/worry.   Therapist Response: Conducted session with . Began session with check-in/update since previous session. Utilized empathetic and reflective listening. Used open-ended questions to facilitate discussion and summarized thoughts/feelings. Scheduled additional appointment and  concluded session.   Suicidal/Homicidal: No  Plan: Return again in 1 week.  Diagnosis: Obsessive-compulsive disorder, unspecified type  Bipolar II disorder (HCC)  Panic attack  Claustrophobia  Collaboration of Care: Medication Management AEB chart review  Patient/Guardian was advised Release of Information must be obtained prior to any record release in order to collaborate their care with an outside provider. Patient/Guardian was advised if they have not already done so to contact the registration department to sign all necessary forms in order for us  to release information regarding their care.   Consent: Patient/Guardian gives verbal consent for treatment and assignment of benefits for services provided during this visit. Patient/Guardian expressed understanding and agreed to proceed.   [name], [facility] 11/22/2023

## 2023-11-21 NOTE — Telephone Encounter (Signed)
 Called patient to make aware that her Duloxetine  has been sent to the pharmacy patient voiced understanding

## 2023-11-27 ENCOUNTER — Ambulatory Visit (INDEPENDENT_AMBULATORY_CARE_PROVIDER_SITE_OTHER): Admitting: Professional Counselor

## 2023-11-27 DIAGNOSIS — F3181 Bipolar II disorder: Secondary | ICD-10-CM | POA: Diagnosis not present

## 2023-11-27 DIAGNOSIS — F410 Panic disorder [episodic paroxysmal anxiety] without agoraphobia: Secondary | ICD-10-CM | POA: Diagnosis not present

## 2023-11-27 DIAGNOSIS — F429 Obsessive-compulsive disorder, unspecified: Secondary | ICD-10-CM

## 2023-11-27 DIAGNOSIS — F40240 Claustrophobia: Secondary | ICD-10-CM | POA: Diagnosis not present

## 2023-11-27 NOTE — Progress Notes (Signed)
 THERAPIST PROGRESS NOTE  Virtual Visit via Video Note  I connected with Victoria Patterson on 11/27/23 at 10:00 AM EDT by a video enabled telemedicine application and verified that I am speaking with the correct person using two identifiers.  Location: Patient: Home Provider: Remote office   I discussed the limitations of evaluation and management by telemedicine and the availability of in person appointments. The patient expressed understanding and agreed to proceed.   I discussed the assessment and treatment plan with the patient. The patient was provided an opportunity to ask questions and all were answered. The patient agreed with the plan and demonstrated an understanding of the instructions.   The patient was advised to call back or seek an in-person evaluation if the symptoms worsen or if the condition fails to improve as anticipated.  I provided 54 minutes of non-face-to-face time during this encounter. [name], [facility]  Session Time: 10:00 AM - 10:54 AM   Participation Level: Active  Behavioral Response: Casual, Alert, Anxious  Type of Therapy: Individual Therapy  Treatment Goals addressed: Active Obsessions Compulsions  LTG: To be able to get rid of the gloves and the part with the germs. That's the most overwhelming part to me. I can't go to the beach because I can't sleep in the beds.                Start:  08/23/23    Expected End:  08/21/24      STG: Ramaya will reduce time involved with obsessions and compulsions AEB self-report.     STG: Karlynn will understand underlying sources of obsessions and/or compulsions AEB evidence of restructured thinking around OCD thinking/behaviors    STG: Katyana will reduce anxiety around driving AEB driving longer distances and riding on the highway.  ProgressTowards Goals: Progressing  Interventions: CBT, Motivational Interviewing, and Supportive  Summary: Victoria Patterson is a 63 y.o. female who presents with a history of  bipolar disorder, OCD, claustrophobia, and panic attacks. She appeared alert and oriented x5. She reported significant anxiety/panic over the weekend. She has since resumed her old medication dosage and feels that she is doing better. She noted possible triggers for increased anxiety over the weekend and will test those triggers with how she responds to them this coming week. Myonna noted skills that have been helpful but agreed she was at a skills breakdown point this past weekend. She was receptive to sharing some of these skills with her husband so he might help her when she gets to that level of distress. She was also receptive to writing down answers to anxiety questions she has so she can practice self-reassurance rather than seeking it from other people.   Therapist Response: Conducted session with Daliya. Began session with check-in/update since previous session. Utilized empathetic and reflective listening. Used open-ended questions to facilitate discussion and summarized Yoko's thoughts/feelings. Explored ways to reduce triggers and attached anxiety/panic by using supports within husband and writing exercises to help Solenne practice self-assurance. Scheduled additional appointment and concluded session.   Suicidal/Homicidal: No  Plan: Return again in 2 weeks.  Diagnosis: Obsessive-compulsive disorder, unspecified type  Bipolar II disorder (HCC)  Panic attack  Claustrophobia  Collaboration of Care: Medication Management AEB chart review  Patient/Guardian was advised Release of Information must be obtained prior to any record release in order to collaborate their care with an outside provider. Patient/Guardian was advised if they have not already done so to contact the registration department to sign all necessary  forms in order for us  to release information regarding their care.   Consent: Patient/Guardian gives verbal consent for treatment and assignment of benefits for services provided  during this visit. Patient/Guardian expressed understanding and agreed to proceed.   [name], [facility] 11/27/2023

## 2023-11-29 ENCOUNTER — Ambulatory Visit: Admitting: Professional Counselor

## 2023-12-05 ENCOUNTER — Telehealth (INDEPENDENT_AMBULATORY_CARE_PROVIDER_SITE_OTHER): Admitting: Psychiatry

## 2023-12-05 ENCOUNTER — Encounter: Payer: Self-pay | Admitting: Psychiatry

## 2023-12-05 DIAGNOSIS — F410 Panic disorder [episodic paroxysmal anxiety] without agoraphobia: Secondary | ICD-10-CM | POA: Diagnosis not present

## 2023-12-05 DIAGNOSIS — F429 Obsessive-compulsive disorder, unspecified: Secondary | ICD-10-CM | POA: Diagnosis not present

## 2023-12-05 DIAGNOSIS — F3181 Bipolar II disorder: Secondary | ICD-10-CM

## 2023-12-05 MED ORDER — FLUVOXAMINE MALEATE 25 MG PO TABS: ORAL_TABLET | ORAL | 1 refills | Status: DC
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
## 2023-12-06 ENCOUNTER — Ambulatory Visit: Admitting: Psychiatry

## 2023-12-10 ENCOUNTER — Telehealth: Payer: Self-pay

## 2023-12-10 NOTE — Telephone Encounter (Signed)
 Medication management - Called patient back to inform Dr. Vickey approved patient to use her Alprazolam when needed but only when experiencing extreme anxiety. Patient stated she understood and just wanted to make sure approved taking with new Luvox  order.

## 2023-12-10 NOTE — Telephone Encounter (Signed)
 Medication management - Patient left a message she has started Fluvoxamine  to help with panic attacks and was to stop us  of Alprazolam. Patient questions on days she has severe panic attacks could she still take a forth, a half, or a whole Xanax up to no more than 2 a day ever.  Patient requesting to know if this is something Dr. Vickey would support, understanding this would not be daily use.

## 2023-12-10 NOTE — Telephone Encounter (Signed)
 It is fine for now, but please advise her to use it only for extreme anxiety.

## 2023-12-11 ENCOUNTER — Telehealth: Payer: Self-pay | Admitting: Psychiatry

## 2023-12-11 NOTE — Telephone Encounter (Signed)
 Called patient she previously had allergic reaction to a different medication which made her nervous about taking the Luvox  she asked questions about how long it would take if she had taken the medication would she have the reaction I advised patient that everyone responds differently with medication and that was something that I could not answer advised patient that if she did take the medication and had an reaction to go to the nearest emergency department or urgent care patient voiced understanding

## 2023-12-11 NOTE — Telephone Encounter (Signed)
 Please advise her to go to the emergency room or urgent care if there are any imminent concerns of an allergic reaction, such as difficulty breathing or severe diarrhea. If symptoms appear to be mild, she may contact the office for further guidance. Let me know if there are any additional questions or concerns.

## 2023-12-12 ENCOUNTER — Ambulatory Visit: Admitting: Professional Counselor

## 2023-12-12 DIAGNOSIS — F3181 Bipolar II disorder: Secondary | ICD-10-CM | POA: Diagnosis not present

## 2023-12-12 DIAGNOSIS — F429 Obsessive-compulsive disorder, unspecified: Secondary | ICD-10-CM

## 2023-12-12 DIAGNOSIS — F410 Panic disorder [episodic paroxysmal anxiety] without agoraphobia: Secondary | ICD-10-CM | POA: Diagnosis not present

## 2023-12-12 NOTE — Progress Notes (Unsigned)
  THERAPIST PROGRESS NOTE  Virtual Visit via Video Note  I connected with Victoria Patterson on 12/12/23 at 10:00 AM EDT by a video enabled telemedicine application and verified that I am speaking with the correct person using two identifiers.  Location: Patient: Home Provider: Office   I discussed the limitations of evaluation and management by telemedicine and the availability of in person appointments. The patient expressed understanding and agreed to proceed.  I discussed the assessment and treatment plan with the patient. The patient was provided an opportunity to ask questions and all were answered. The patient agreed with the plan and demonstrated an understanding of the instructions.   The patient was advised to call back or seek an in-person evaluation if the symptoms worsen or if the condition fails to improve as anticipated.  I provided 56 minutes of non-face-to-face time during this encounter. Victoria Patterson, [facility]  Session Time: 10:05 AM - 11:01 AM  Participation Level: Active  Behavioral Response: CasualAlertAnxious  Type of Therapy: Individual Therapy  Treatment Goals addressed: ***  ProgressTowards Goals: Not Progressing  Interventions: CBT, Motivational Interviewing, Solution Focused, Supportive, and Other: Coping skills  Summary: Victoria Patterson is a 63 y.o. female who presents with ***.   Therapist Response: Conducted session with . Began session with check-in/update since previous session. Utilized empathetic and reflective listening. Used open-ended questions to facilitate discussion and summarized thoughts/feelings. Scheduled additional appointment and concluded session.   Suicidal/Homicidal: No  Plan: Return again in 1 week.  Diagnosis: Panic disorder  Obsessive-compulsive disorder, unspecified type  Bipolar II disorder (HCC)  Collaboration of Care: Medication Management AEB chart review, staff with Dr. Hisada about medications  Patient/Guardian  was advised Release of Information must be obtained prior to any record release in order to collaborate their care with an outside provider. Patient/Guardian was advised if they have not already done so to contact the registration department to sign all necessary forms in order for us  to release information regarding their care.   Consent: Patient/Guardian gives verbal consent for treatment and assignment of benefits for services provided during this visit. Patient/Guardian expressed understanding and agreed to proceed.   Victoria Patterson, [facility] 12/12/2023

## 2023-12-13 ENCOUNTER — Telehealth: Payer: Self-pay | Admitting: Professional Counselor

## 2023-12-13 NOTE — Telephone Encounter (Signed)
 Returned call to pt. Encouraged Victoria Patterson to identify answers to questions we previously discussed, rather than seeking reassurance. Reminded Victoria Patterson of the cycle of avoidance that is reinforcing her anxiety. Reminded Victoria Patterson of coping skill (notebook with questions and answers listed to review rather than seeking reassurance) and encouraged her to take time today to complete this. Provided positive reinforcement for taking medication. Confirmed next weeks appointment and ended call.

## 2023-12-14 ENCOUNTER — Other Ambulatory Visit: Payer: Self-pay

## 2023-12-14 ENCOUNTER — Telehealth: Payer: Self-pay

## 2023-12-14 ENCOUNTER — Emergency Department
Admission: EM | Admit: 2023-12-14 | Discharge: 2023-12-15 | Disposition: A | Discharge status: Home / Self Care | Attending: Emergency Medicine | Admitting: Emergency Medicine

## 2023-12-14 DIAGNOSIS — R251 Tremor, unspecified: Secondary | ICD-10-CM | POA: Insufficient documentation

## 2023-12-14 DIAGNOSIS — I10 Essential (primary) hypertension: Secondary | ICD-10-CM | POA: Insufficient documentation

## 2023-12-14 DIAGNOSIS — F419 Anxiety disorder, unspecified: Secondary | ICD-10-CM | POA: Insufficient documentation

## 2023-12-14 NOTE — ED Triage Notes (Signed)
 Patient brought in by husband for hypertension, blood pressure at home 150/120 and 177/119. Patient has been taking all meds as prescribed. Pt denies any s/s. Pt AxOx4, ambulatory with steady gait. Hx anxiety.

## 2023-12-14 NOTE — Telephone Encounter (Signed)
 She may restart duloxetine  if she feels comfortable doing so, as she previously discontinued it on her own. Recommend restarting at 30 mg daily. Please let me know if she needs a refill.

## 2023-12-14 NOTE — Telephone Encounter (Signed)
 Patient called to report that since starting the fluvoxaMINE  (LUVOX ) 25 MG tablet  she is experiencing tremors she states that her lips and mouth are shaking along with her arms,hands which she feels her hands are shaking more and her legs are moving also please advise

## 2023-12-14 NOTE — Telephone Encounter (Signed)
 Please advise her to discontinue fluvoxamine  to see if her symptoms improve. If symptoms persist, she should follow up with her primary care provider for further evaluation. Once her symptoms have resolved, if she is interested in starting Buspar for anxiety, that can be considered. Please let me know if she would like to try this option or prefers not to start any new medication at this time.

## 2023-12-14 NOTE — Telephone Encounter (Signed)
 Patient would like to know if you would like for her to go back to taking the duloxetine  please advise

## 2023-12-14 NOTE — Telephone Encounter (Signed)
 Called patient she stated she does feel comfortable restarting the Duloxetine  30 mg she has about 20 pills remaining and will call the office when she needs a refill

## 2023-12-15 NOTE — ED Provider Notes (Signed)
 [facility] Provider Note    Event Date/Time   First MD Initiated Contact with Patient 12/14/23 2317     (approximate)   History   Hypertension   HPI Victoria Patterson is a 63 y.o. female who presents with concerns about hypertension.  She takes an antihypertensive prescribed by her PCP, Dr. Auston, and is also on multiple medications for anxiety.  She states she has been having some side effects (tremors) secondary to just recently starting a new anxiety medicine so she has stopped taking it as of yesterday and has only had 2 doses.  She took her usual alprazolam tonight but noticed that her blood pressure was higher than usual and the more she checked at the higher it would go.  She has no other symptoms other than feeling anxious but she also said that she is very concerned about her son who is away and him being away from home is weighing on her mind.  She is having no chest pain or shortness of breath and does not have a headache or any visual changes.     Physical Exam   Triage Vital Signs: ED Triage Vitals  Encounter Vitals Group     BP 12/14/23 2254 (!) 185/95     Girls Systolic BP Percentile --      Girls Diastolic BP Percentile --      Boys Systolic BP Percentile --      Boys Diastolic BP Percentile --      Pulse Rate 12/14/23 2254 70     Resp 12/14/23 2254 18     Temp 12/14/23 2254 98.2 F (36.8 C)     Temp Source 12/14/23 2254 Oral     SpO2 12/14/23 2254 100 %     Weight 12/14/23 2255 63.5 kg (140 lb)     Height 12/14/23 2255 1.626 m (5' 4)     Head Circumference --      Peak Flow --      Pain Score 12/14/23 2255 0     Pain Loc --      Pain Education --      Exclude from Growth Chart --     Most recent vital signs: Vitals:   12/15/23 0023 12/15/23 0024  BP:  (!) 161/93  Pulse: 61   Resp:    Temp:    SpO2: 100%     General: Awake, no distress.  Well-appearing, pleasant, conversational, oriented. CV:  Good peripheral perfusion.   Resp:  Normal effort. Speaking easily and comfortably, no accessory muscle usage nor intercostal retractions.   Abd:  No distention.    ED Results / Procedures / Treatments   Labs (all labs ordered are listed, but only abnormal results are displayed) Labs Reviewed - No data to display     PROCEDURES:  Critical Care performed: No  Procedures    IMPRESSION / MDM / ASSESSMENT AND PLAN / ED COURSE  I reviewed the triage vital signs and the nursing notes.                              Differential diagnosis includes, but is not limited to, essential hypertension, anxiety, renal dysfunction, medication or drug side effect.  Patient's presentation is most consistent with exacerbation of chronic illness.    Patient is very pleasant and is having some issues with labile blood pressure.  Blood pressure came down substantially while she was here  and then would come back up on rechecks.  However she is asymptomatic.  I reviewed her medical record including PCP clinic notes written by Dr. Auston.  Her blood pressure is generally well-controlled.  I had my usual and customary asymptomatic hypertension discussion with the patient and explained why I do not feel that intervention or further evaluation or workup is necessary or appropriate at this time.  She and her husband were very understanding and agree with the plan to continue her on her regular medications and follow-up with Dr. Auston.  The patient's medical screening exam is reassuring with no indication of an emergent medical condition requiring hospitalization or additional evaluation at this point.  The patient is safe and appropriate for discharge and outpatient follow up.         FINAL CLINICAL IMPRESSION(S) / ED DIAGNOSES   Final diagnoses:  Primary hypertension     Rx / DC Orders   ED Discharge Orders     None        Note:  This document was prepared using Dragon voice recognition software and may include  unintentional dictation errors.   Gordan Huxley, MD 12/15/23 [phone]

## 2023-12-15 NOTE — Discharge Instructions (Signed)
As we discussed, though you do have high blood pressure (hypertension), fortunately it is not immediately dangerous at this time and does not need emergency intervention or admission to the hospital.  If we add to or change your regular medications, we may cause more harm than good - it is more appropriate for your primary care doctor to evaluate you in clinic and decide if any medication changes are needed.  Please follow up in clinic as recommended in these papers.   ° °Return to the Emergency Department (ED) if you experience any worsening chest pain/pressure/tightness, difficulty breathing, or sudden sweating, or other symptoms that concern you. °

## 2023-12-16 ENCOUNTER — Other Ambulatory Visit: Payer: Self-pay | Admitting: Psychiatry

## 2023-12-17 ENCOUNTER — Telehealth: Payer: Self-pay | Admitting: Psychiatry

## 2023-12-19 ENCOUNTER — Ambulatory Visit: Admitting: Professional Counselor

## 2023-12-19 DIAGNOSIS — F3181 Bipolar II disorder: Secondary | ICD-10-CM

## 2023-12-19 DIAGNOSIS — F410 Panic disorder [episodic paroxysmal anxiety] without agoraphobia: Secondary | ICD-10-CM | POA: Diagnosis not present

## 2023-12-19 DIAGNOSIS — F429 Obsessive-compulsive disorder, unspecified: Secondary | ICD-10-CM | POA: Diagnosis not present

## 2023-12-19 NOTE — Progress Notes (Unsigned)
 THERAPIST PROGRESS NOTE  Virtual Visit via Video Note  I connected with Victoria Patterson on 12/20/23 at  2:00 PM EDT by a video enabled telemedicine application and verified that I am speaking with the correct person using two identifiers.  Location: Patient: Home Provider: Office   I discussed the limitations of evaluation and management by telemedicine and the availability of in person appointments. The patient expressed understanding and agreed to proceed.  I discussed the assessment and treatment plan with the patient. The patient was provided an opportunity to ask questions and all were answered. The patient agreed with the plan and demonstrated an understanding of the instructions.   The patient was advised to call back or seek an in-person evaluation if the symptoms worsen or if the condition fails to improve as anticipated.  I provided 55 minutes of non-face-to-face time during this encounter. Victoria Patterson, [facility]  Session Time: 2:00 PM - 2:55 PM   Participation Level: Active  Behavioral Response: Casual, Alert, Anxious  Type of Therapy: Individual Therapy  Treatment Goals addressed: Active Obsessions Compulsions  LTG: To be able to get rid of the gloves and the part with the germs. That's the most overwhelming part to me. I can't go to the beach because I can't sleep in the beds.                Start:  08/23/23    Expected End:  08/21/24      STG: Victoria Patterson will reduce time involved with obsessions and compulsions AEB self-report.     STG: Victoria Patterson will understand underlying sources of obsessions and/or compulsions AEB evidence of restructured thinking around OCD thinking/behaviors    STG: Victoria Patterson will reduce anxiety around driving AEB driving longer distances and riding on the highway.  ProgressTowards Goals: Progressing  Interventions: CBT and Supportive  Summary: Victoria Patterson is a 63 y.o. female who presents with a history of bipolar and panic disorder and OCD.  She appeared anxious but oriented x5. She stated she hasn't been doing well. She reported a side effect of tremors from the new medication, which she has quit taking. She also quit taking Alprazolam after learning it can be addictive. Victoria Patterson reported increased anxiety/panic and behaviors such as restlessness and paranoia. She has been sleeping downstairs due to some of these thoughts. She noted issues with sleep lately which may be related to benzo withdrawal. Victoria Patterson was receptive to skills discussed in session and will attempt to implement those to help. She shared that her son is coming home and she fears this will cause her to have more struggles when he leaves again. She noted ways to cope ahead with this and affirmations to practice to build new beliefs about her ability to tolerate/cope.   Therapist Response: Conducted session with Victoria Patterson. Began session with check-in/update since previous session. Utilized empathetic and reflective listening. Used open-ended questions to facilitate discussion and summarized Victoria Patterson's thoughts/feelings. Reminded Victoria Patterson of grounding techniques and encouraged use of those over walking, as walking has become compulsive and creates more energy. Explored ways to cope ahead with son's visit/departure. Identified affirmations to help build tolerance and beliefs about ability to cope. Scheduled additional appointment and concluded session.   Suicidal/Homicidal: No  Plan: Return again in 1 week.  Diagnosis: Obsessive-compulsive disorder, unspecified type  Bipolar II disorder (HCC)  Panic disorder  Collaboration of Care: Medication Management AEB chart review  Patient/Guardian was advised Release of Information must be obtained prior to any record release in  order to collaborate their care with an outside provider. Patient/Guardian was advised if they have not already done so to contact the registration department to sign all necessary forms in order for us  to release  information regarding their care.   Consent: Patient/Guardian gives verbal consent for treatment and assignment of benefits for services provided during this visit. Patient/Guardian expressed understanding and agreed to proceed.   Victoria [name], [facility] 12/20/2023

## 2023-12-25 ENCOUNTER — Ambulatory Visit: Payer: 59 | Admitting: Dermatology

## 2023-12-25 ENCOUNTER — Encounter: Attending: Internal Medicine | Admitting: Dietician

## 2023-12-25 ENCOUNTER — Encounter: Payer: Self-pay | Admitting: Dietician

## 2023-12-25 ENCOUNTER — Ambulatory Visit: Admitting: Professional Counselor

## 2023-12-25 DIAGNOSIS — I10 Essential (primary) hypertension: Secondary | ICD-10-CM | POA: Diagnosis not present

## 2023-12-25 DIAGNOSIS — E118 Type 2 diabetes mellitus with unspecified complications: Secondary | ICD-10-CM | POA: Insufficient documentation

## 2023-12-25 DIAGNOSIS — M6250 Muscle wasting and atrophy, not elsewhere classified, unspecified site: Secondary | ICD-10-CM | POA: Insufficient documentation

## 2023-12-25 DIAGNOSIS — R634 Abnormal weight loss: Secondary | ICD-10-CM | POA: Insufficient documentation

## 2023-12-25 DIAGNOSIS — Z713 Dietary counseling and surveillance: Secondary | ICD-10-CM | POA: Insufficient documentation

## 2023-12-25 NOTE — Patient Instructions (Addendum)
 Try having 1 new food each week. Start with having a bagel with peanut butter in the morning.  Take a small bite of your cousins food when going to eat out at [employer].  Switch to Boost High Protein for a higher calorie supplement!  Add in a black bean and cheese burrito as a meal 1-2 times a week.

## 2023-12-25 NOTE — Progress Notes (Signed)
 Medical Nutrition Therapy  Appointment Start time:  1620  Appointment End time:  1700  Primary concerns today: Weight Loss, Gout flares  Referral diagnosis: I10 - HTN, E11.8 - T2DM Preferred learning style: No preference indicated Learning readiness: Contemplating   NUTRITION ASSESSMENT   Clinical Medical Hx: T2DM, HTN, Gout, Weight loss, UTIs Medications: Allopurinol , Duloxetine , Ezetimibe , Levothyroxine , Lamictal , NEW: Bisoprolol Labs: (NEW 10/23/2023) Cholesterol - 299, LDL - 227, BUN - 32, RBC - 3.64, Hgb - 11.5, HCT - 34.4,  Notable Signs/Symptoms: Slight muscle wasting (interosseous, deltoid), ridges in fingernails, hair loss   Lifestyle & Dietary Hx Pt reports continuing to restrict intake out of fear of gout flare up, states they  Pt reports doing well with eating over the summer, states their son moved to [facility] and they haven't been able to eat as well since then. Pt reports seeing therapist for separation anxiety, finds them to be helpful. Pt reports weight loss of ~15 lbs over the past 5 months. Pt reports going out to eat at [facility] with their cousin once a week now, has been trying a couple new foods. Pt reports continuing to drink Premier protein shake daily.    Estimated daily fluid intake: 48-64 oz Supplements: Prenatal MVI, Cranberry, Vit D Sleep: No issues Stress / self-care: Mild Current average weekly physical activity: ADLs, Walking daily for ~10-15 minutes   24-Hr Dietary Recall First Meal: 2 slices of Wheat bread w/ peanut butter, grape juice Snack: Grapes  Snack: Protein Shake Second Meal: Cheese, pecans Third Meal: Cheese sandwich on oat nut bread Snack: Beverages: Grape juice, Water w/ Sunkist flavor pack   NUTRITION DIAGNOSIS  [place]-3.2 Unintentional weight loss As related to restrictive eating.  As evidenced by weight loss for 50 lbs x 5 months, fear of eating, muscle wasting.   NUTRITION INTERVENTION  Nutrition education (E-1) on the  following topics:  Educated patient on importance of adequate protein intake daily. Educated patient on signs/symptoms of insufficient protein intake (hair loss, grooves in nails, recurrent infections, weakness, muscle wasting, etc.). Educated patient on sources of plant-based protein, as well as appropriate high protein ONS. Educated patient on importance of whole grains, and whole fruits to maximize nutrition with sources of carbs. Educated patient on dietary sources of iron while following a meatless diet.   Handouts Provided Include  Low Purine Nutrition Therapy Power Plate Fruits list Iron Rich Nutrition Therapy NEW: Plant Based Protein List  Learning Style & Readiness for Change Teaching method utilized: Visual & Auditory  Demonstrated degree of understanding via: Teach Back  Barriers to learning/adherence to lifestyle change: Restrictive eating/Fear of food   Goals Established by Pt Try having 1 new food each week. Start with having a bagel with peanut butter in the morning. Take a small bite of your cousins food when going to eat out at [employer]. Switch to Boost High Protein for a higher calorie supplement! Add in a black bean and cheese burrito as a meal 1-2 times a week.   MONITORING & EVALUATION Dietary intake, weekly physical activity, and protein intake in 6 weeks.  Next Steps  Patient is to follow up with RD.

## 2023-12-27 ENCOUNTER — Ambulatory Visit: Admitting: Professional Counselor

## 2023-12-27 DIAGNOSIS — F410 Panic disorder [episodic paroxysmal anxiety] without agoraphobia: Secondary | ICD-10-CM | POA: Diagnosis not present

## 2023-12-27 DIAGNOSIS — F3181 Bipolar II disorder: Secondary | ICD-10-CM | POA: Diagnosis not present

## 2023-12-27 DIAGNOSIS — F429 Obsessive-compulsive disorder, unspecified: Secondary | ICD-10-CM

## 2023-12-27 NOTE — Progress Notes (Signed)
 THERAPIST PROGRESS NOTE  Virtual Visit via Video Note  I connected with Catheryn A Rorrer on 12/29/23 at  2:00 PM EDT by a video enabled telemedicine application and verified that I am speaking with the correct person using two identifiers.  Location: Patient: Home Provider: Remote office   I discussed the limitations of evaluation and management by telemedicine and the availability of in person appointments. The patient expressed understanding and agreed to proceed.   I discussed the assessment and treatment plan with the patient. The patient was provided an opportunity to ask questions and all were answered. The patient agreed with the plan and demonstrated an understanding of the instructions.   The patient was advised to call back or seek an in-person evaluation if the symptoms worsen or if the condition fails to improve as anticipated.  I provided 54 minutes of non-face-to-face time during this encounter. [name], [facility]  Session Time: 2:01 PM - 2:55 PM   Participation Level: Active  Behavioral Response: Casual, Alert, Anxious  Type of Therapy: Individual Therapy  Treatment Goals addressed: Active Obsessions Compulsions  LTG: To be able to get rid of the gloves and the part with the germs. That's the most overwhelming part to me. I can't go to the beach because I can't sleep in the beds.                Start:  08/23/23    Expected End:  08/21/24      STG: Arshiya will reduce time involved with obsessions and compulsions AEB self-report.     STG: Donnielle will understand underlying sources of obsessions and/or compulsions AEB evidence of restructured thinking around OCD thinking/behaviors    STG: Dawson will reduce anxiety around driving AEB driving longer distances and riding on the highway.  ProgressTowards Goals: Progressing  Interventions: CBT, Motivational Interviewing, Solution Focused, and Supportive  Summary: CARLENE BICKLEY is a 63 y.o. female who presents  with a history of panic and bipolar disorder and OCD. She appeared alert and oriented x5. She stated things have continued to be rough but she's noticed improvement since her son came home for fall break. Mirabel would like to find activities to help occupy her time to reduce anxiety and attached behaviors like walking for restlessness. She has been doing some things with her cousin and returning to church. She took notes of additional options for social engagement. Julie shared that she was able to eat at a church function which is a major improvement. She explored other ways to build exposure and was reminded of grounding/coping skills.   Therapist Response: Conducted session with Enola. Began session with check-in/update since previous session. Utilized empathetic and reflective listening. Used open-ended questions to facilitate discussion and summarized Jimma's thoughts/feelings. Explored activities for distraction/social engagement. Praised Mystie on completing exposure exercise. Noted other ways for exposure and reminded Elener of grounding/coping skills to manage anxiety. Scheduled additional appointment and concluded session.   Suicidal/Homicidal: No  Plan: Return again in 1 week.  Diagnosis: Obsessive-compulsive disorder, unspecified type  Bipolar II disorder (HCC)  Panic disorder  Collaboration of Care: Medication Management AEB chart review  Patient/Guardian was advised Release of Information must be obtained prior to any record release in order to collaborate their care with an outside provider. Patient/Guardian was advised if they have not already done so to contact the registration department to sign all necessary forms in order for us  to release information regarding their care.   Consent: Patient/Guardian gives verbal consent  for treatment and assignment of benefits for services provided during this visit. Patient/Guardian expressed understanding and agreed to proceed.   [name], [facility] 12/29/2023

## 2024-01-03 ENCOUNTER — Ambulatory Visit: Admitting: Professional Counselor

## 2024-01-06 ENCOUNTER — Other Ambulatory Visit: Payer: Self-pay | Admitting: Psychiatry

## 2024-01-10 ENCOUNTER — Ambulatory Visit (INDEPENDENT_AMBULATORY_CARE_PROVIDER_SITE_OTHER): Admitting: Professional Counselor

## 2024-01-10 DIAGNOSIS — F40240 Claustrophobia: Secondary | ICD-10-CM

## 2024-01-10 DIAGNOSIS — F410 Panic disorder [episodic paroxysmal anxiety] without agoraphobia: Secondary | ICD-10-CM

## 2024-01-10 DIAGNOSIS — F3181 Bipolar II disorder: Secondary | ICD-10-CM

## 2024-01-10 DIAGNOSIS — F429 Obsessive-compulsive disorder, unspecified: Secondary | ICD-10-CM

## 2024-01-10 NOTE — Progress Notes (Signed)
  THERAPIST PROGRESS NOTE  Session Time: 9:08 AM - 10:01 AM   Participation Level: Active  Behavioral Response: Casual, Alert, Anxious  Type of Therapy: Individual Therapy  Treatment Goals addressed: Active Obsessions Compulsions  LTG: To be able to get rid of the gloves and the part with the germs. That's the most overwhelming part to me. I can't go to the beach because I can't sleep in the beds. (Progressing)    Start:  08/23/23    Expected End:  08/21/24    Goal Note Reviewed 01/10/24 - Still wearing gloves at times and fear of germs, has been able to hug people, eat at functions.   STG: Halima will reduce time involved with obsessions and compulsions AEB self-report.  (Progressing)  Goal Note Reviewed 01/10/24 - Some good and not so good.  STG: Chasmine will understand underlying sources of obsessions and/or compulsions AEB evidence of restructured thinking around OCD thinking/behaviors (Progressing)  Goal Note Reviewed 01/10/24 - I think I'm doing okay on that. I'm doing a little better since I started working with you. Now, I don't know if I can stop it, but I kind of understand why I'm doing it.  STG: Rodney will reduce anxiety around driving AEB driving longer distances and riding on the highway.  (Progressing)  Goal Note Reviewed 01/10/24 - Well I've been on 49 so that's progress. It's a back road but it's something. Probably I'm not doing as well on that.   ProgressTowards Goals: Progressing  Interventions: CBT, Motivational Interviewing, and Supportive  Summary: CALDONIA LEAP is a 63 y.o. female who presents with a history of panic and bipolar disorder, claustrophobia, and OCD. She appeared anxious but oriented x5. She reported she has continued to struggle with anxiety/panic but was also able to note some ways she has been able to improve. Alaysia doesn't feel ready to attempt ERP with her phobias, including riding the elevator. She is open to driving/riding more as being  busy has helped with her anxiety. She was receptive to [employer] since she is unsure of her ability to walk at the zoo. Jihan noted progress on goals and areas for continued improvement.   Therapist Response: Conducted session with Shelvy. Began session with check-in/update since previous session. Utilized empathetic and reflective listening. Used open-ended questions to facilitate discussion and summarized Raeanne's thoughts/feelings. Reinforced exposure to anxiety triggers and reminded Ayliana to continue using coping skills. Brainstormed ways to get out of the house and shared about [address], which has a drive thru zoo. Reviewed treatment plan with input from Oliana on current strengths, needs, and progress towards goals. Scheduled additional appointment and concluded session.   Suicidal/Homicidal: No  Plan: Return again in 1 week.  Diagnosis: Obsessive-compulsive disorder, unspecified type  Panic disorder  Bipolar II disorder (HCC)  Claustrophobia  Collaboration of Care: Medication Management AEB chart review  Patient/Guardian was advised Release of Information must be obtained prior to any record release in order to collaborate their care with an outside provider. Patient/Guardian was advised if they have not already done so to contact the registration department to sign all necessary forms in order for us  to release information regarding their care.   Consent: Patient/Guardian gives verbal consent for treatment and assignment of benefits for services provided during this visit. Patient/Guardian expressed understanding and agreed to proceed.   [name], [facility] 01/10/2024

## 2024-01-17 ENCOUNTER — Ambulatory Visit: Admitting: Professional Counselor

## 2024-01-17 DIAGNOSIS — F429 Obsessive-compulsive disorder, unspecified: Secondary | ICD-10-CM

## 2024-01-17 DIAGNOSIS — F40240 Claustrophobia: Secondary | ICD-10-CM | POA: Diagnosis not present

## 2024-01-17 DIAGNOSIS — F410 Panic disorder [episodic paroxysmal anxiety] without agoraphobia: Secondary | ICD-10-CM

## 2024-01-17 DIAGNOSIS — F3181 Bipolar II disorder: Secondary | ICD-10-CM | POA: Diagnosis not present

## 2024-01-17 NOTE — Progress Notes (Signed)
  THERAPIST PROGRESS NOTE  Session Time: 2:02 PM - 2:57 AM   Participation Level: Active  Behavioral Response: Well Groomed, Alert, Anxious  Type of Therapy: Individual Therapy  Treatment Goals addressed: Active Obsessions Compulsions  LTG: To be able to get rid of the gloves and the part with the germs. That's the most overwhelming part to me. I can't go to the beach because I can't sleep in the beds. (Progressing)                Start:  08/23/23    Expected End:  08/21/24    Goal Note Reviewed 01/10/24 - Still wearing gloves at times and fear of germs, has been able to hug people, eat at functions.    STG: Victoria Patterson will reduce time involved with obsessions and compulsions AEB self-report.  (Progressing)  Goal Note Reviewed 01/10/24 - Some good and not so good.   STG: Victoria Patterson will understand underlying sources of obsessions and/or compulsions AEB evidence of restructured thinking around OCD thinking/behaviors (Progressing)  Goal Note Reviewed 01/10/24 - I think I'm doing okay on that. I'm doing a little better since I started working with you. Now, I don't know if I can stop it, but I kind of understand why I'm doing it.   STG: Victoria Patterson will reduce anxiety around driving AEB driving longer distances and riding on the highway.  (Progressing)  Goal Note Reviewed 01/10/24 - Well I've been on 49 so that's progress. It's a back road but it's something. Probably I'm not doing as well on that.   ProgressTowards Goals: Progressing  Interventions: CBT, Motivational Interviewing, and Supportive  Summary: Victoria Patterson is a 63 y.o. female who presents with a history of bipolar disorder, claustrophobia, panic disorder, and OCD. She appeared anxious but oriented x5. She stated she has continued to make improvements on some things, eating in public, hugging people, etc. However, she continues to struggle with racing thoughts and anxiety/panic attacks. Victoria Patterson was receptive to coping Patterson and cycle of  anxiety. She expressed understanding about safety behaviors reinforcing anxiety. She was in agreement to work on changing thought patterns, I can't, to It's hard for me. Victoria Patterson.   Therapist Response: Conducted session with Victoria Patterson. Began session with check-in/update since previous session. Utilized empathetic and reflective listening. Used open-ended questions to facilitate discussion and summarized Victoria Patterson's thoughts/feelings. Praised Victoria Patterson for continued progress on eating in public. Provided psychoeducation on coping skills, cycle of anxiety, and safety behaviors. Highlighted ongoing statements of I can't and encouraged Victoria Patterson to restructure those thoughts to It's hard for me. Provided handouts to practice skills more consistently at home. Scheduled additional appointment and concluded session.   Suicidal/Homicidal: No  Plan: Return again in 2 weeks.  Diagnosis: Panic disorder  Claustrophobia  Obsessive-compulsive disorder, unspecified type  Bipolar II disorder (HCC)  Collaboration of Care: Medication Management AEB chart review  Patient/Guardian was advised Release of Information must be obtained prior to any record release in order to collaborate their care with an outside provider. Patient/Guardian was advised if they have not already done so to contact the registration department to sign all necessary forms in order for us  to release information regarding their care.   Consent: Patient/Guardian gives verbal consent for treatment and assignment of benefits for services provided during this visit. Patient/Guardian expressed understanding and agreed to proceed.   Victoria Patterson, [facility] 01/17/2024

## 2024-01-20 NOTE — Progress Notes (Unsigned)
 BH MD/PA/NP OP Progress Note  01/20/2024 12:08 PM YELINA SARRATT  MRN:  [id number]  Chief Complaint: No chief complaint on file.  HPI: ***  Back to duloxetine  Luvox - tremor?  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  She was spanked every day.  She remembers it vividly, although she denies any PTSD symptoms.  Her father was alcoholic, and died from toxic shock syndrome (he had lung cancer)  Visit Diagnosis: No diagnosis found.  Past Psychiatric History: Please see initial evaluation for full details. I have reviewed the history. No updates at this time.     Past Medical History:  Past Medical History:  Diagnosis Date   ADHD (attention deficit hyperactivity disorder)    Anxiety    Arthritis    Asthma    controlled for many years   Depression    Diabetes mellitus, type II (HCC)    Fatty liver    Hypertension    Hypothyroidism    Obsessive-compulsive disorder     Past Surgical History:  Procedure Laterality Date   BREAST CYST ASPIRATION Left 1992   neg   CHOLECYSTECTOMY     dilation and evacuation     x2   HYSTEROSCOPY WITH D & C N/A 02/04/2018   Procedure: DILATATION AND CURETTAGE LELDON, fractional;  Surgeon: Schermerhorn, Debby PARAS, MD;  Location: [facility] ORS;  Service: Gynecology;  Laterality: N/A;    Family Psychiatric History: Please see initial evaluation for full details. I have reviewed the history. No updates at this time.     Family History:  Family History  Problem Relation Age of Onset   Ovarian cancer Mother 58   Bipolar disorder Mother    Breast cancer Paternal Aunt 58   Breast cancer Cousin        maternal   Alcohol abuse Father     Social  History:  Social History   Socioeconomic History   Marital status: Married    Spouse name: michael   Number of children: 1   Years of education: Not on file   Highest education level: Master's degree (e.g., MA, MS, MEng, MEd, MSW, MBA)  Occupational History   Not on file  Tobacco Use   Smoking status: Never    Passive exposure: Never   Smokeless tobacco: Never  Vaping Use   Vaping status: Never Used  Substance and Sexual Activity   Alcohol use: No   Drug use: No   Sexual activity: Not Currently  Other Topics Concern   Not on file  Social History Narrative   Not on file   Social Drivers of Health   Financial Resource Strain: Medium Risk (06/05/2023)   Overall Financial Resource Strain (CARDIA)    Difficulty of Paying Living Expenses: Somewhat hard  Food Insecurity: No Food Insecurity (06/05/2023)   Hunger Vital Sign    Worried About Running Out of Food in the Last Year: Never true    Ran Out of Food in the Last Year: Never true  Transportation Needs: No Transportation Needs (06/05/2023)   PRAPARE - Administrator, Civil Service (Medical): No    Lack of Transportation (Non-Medical): No  Physical Activity: Insufficiently Active (06/05/2023)   Exercise Vital  Sign    Days of Exercise per Week: 4 days    Minutes of Exercise per Session: 30 min  Stress: Stress Concern Present (06/05/2023)   [employer] of Occupational Health - Occupational Stress Questionnaire    Feeling of Stress : Very much  Social Connections: Socially Isolated (06/05/2023)   Social Connection and Isolation Panel    Frequency of Communication with Friends and Family: Once a week    Frequency of Social Gatherings with Friends and Family: Never    Attends Religious Services: Never    Database Administrator or Organizations: No    Attends Banker Meetings: Never    Marital Status: Married    Allergies:  Allergies  Allergen Reactions   Bactrim [Sulfamethoxazole-Trimethoprim]  Other (See Comments)    hypoglycemia   Ciprofloxacin  Other (See Comments)    She experienced hypertension and pedal edema which caused her to seek treatment in the ED.   Penicillins Hives    Has patient had a PCN reaction causing immediate rash, facial/tongue/throat swelling, SOB or lightheadedness with hypotension: No Has patient had a PCN reaction causing severe rash involving mucus membranes or skin necrosis: Yes Has patient had a PCN reaction that required hospitalization: No Has patient had a PCN reaction occurring within the last 10 years: No If all of the above answers are NO, then may proceed with Cephalosporin use.    Stadol [Butorphanol] Anxiety    Metabolic Disorder Labs: Lab Results  Component Value Date   HGBA1C 5.5 10/03/2022   MPG 111.15 10/03/2022   No results found for: PROLACTIN No results found for: CHOL, TRIG, HDL, CHOLHDL, VLDL, LDLCALC No results found for: TSH  Therapeutic Level Labs: No results found for: LITHIUM No results found for: VALPROATE No results found for: CBMZ  Current Medications: Current Outpatient Medications  Medication Sig Dispense Refill   Accu-Chek Softclix Lancets lancets 1 each 3 (three) times daily.     allopurinol  (ZYLOPRIM ) 300 MG tablet Take 300 mg by mouth daily.     bisoprolol (ZEBETA) 5 MG tablet Take 5 mg by mouth.     ciclopirox  (PENLAC ) 8 % solution Apply over nail and surrounding skin every night at bedtime. Apply daily over previous coat. After seven (7) days, may remove with alcohol and continue cycle. 6.6 mL 11   clotrimazole (LOTRIMIN) 1 % external solution      conjugated estrogens  (PREMARIN ) vaginal cream Apply 0.5mg  (pea-sized amount)  just inside the vaginal introitus with a finger-tip on  Monday, Wednesday and Friday nights. 30 g 12   Cranberry (RA CRANBERRY) 500 MG CAPS Take by mouth once.     DULoxetine  (CYMBALTA ) 30 MG capsule Take 1 capsule (30 mg total) by mouth daily. 30 capsule 0    Evolocumab 140 MG/ML SOAJ Inject 140 mg into the skin every 14 (fourteen) days.     ezetimibe  (ZETIA ) 10 MG tablet Take 10 mg by mouth daily.     fluconazole  (DIFLUCAN ) 200 MG tablet Take 1 tablet once daily until finished. 30 tablet 0   Fluocinolone  Acetonide 0.01 % OIL Apply 1-2 drops to ears once to twice daily as needed for itch. 20 mL 5   fluvoxaMINE  (LUVOX ) 25 MG tablet 25 mg at night for two weeks, then 50 mg at night 60 tablet 1   ketoconazole  (NIZORAL ) 2 % shampoo 2-3 times a week lather on scalp, leave on 5-8 minutes, rinse out 120 mL 11   lamoTRIgine  (LAMICTAL ) 200 MG tablet Take 1  tablet (200 mg total) by mouth daily. 90 tablet 1   levothyroxine  (SYNTHROID , LEVOTHROID) 75 MCG tablet Take 75 mcg by mouth daily before breakfast.     PRECISION QID TEST test strip 2 (two) times daily at 10 AM and 5 PM.     Prenatal Vit-Fe Fumarate-FA (PRENATAL 19 PO) Take by mouth.     triamcinolone  cream (KENALOG ) 0.1 % Apply twice daily to hands up to 2 weeks as needed for rash/itching. Avoid applying to face, groin, and axilla. 30 g 2   No current facility-administered medications for this visit.     Musculoskeletal: Strength & Muscle Tone: normal Gait & Station: normal Patient leans: N/A  Psychiatric Specialty Exam: Review of Systems  There were no vitals taken for this visit.There is no height or weight on file to calculate BMI.  General Appearance: {Appearance:22683}  Eye Contact:  {BHH EYE CONTACT:22684}  Speech:  Clear and Coherent  Volume:  Normal  Mood:  {BHH MOOD:22306}  Affect:  {Affect (PAA):22687}  Thought Process:  Coherent  Orientation:  Full (Time, Place, and Person)  Thought Content: Logical   Suicidal Thoughts:  {ST/HT (PAA):22692}  Homicidal Thoughts:  {ST/HT (PAA):22692}  Memory:  Immediate;   Good  Judgement:  {Judgement (PAA):22694}  Insight:  {Insight (PAA):22695}  Psychomotor Activity:  Normal  Concentration:  Concentration: Good and Attention Span: Good   Recall:  Good  Fund of Knowledge: Good  Language: Good  Akathisia:  No  Handed:  Right  AIMS (if indicated): not done  Assets:  Communication Skills Desire for Improvement  ADL's:  Intact  Cognition: WNL  Sleep:  {BHH GOOD/FAIR/POOR:22877}   Screenings: GAD-7    Advertising Copywriter from 06/05/2023 in [facility] Visit from 04/16/2023 in [facility] Visit from 02/22/2023 in [facility] Visit from 10/10/2022 in [facility] Visit from 06/08/2022 in [facility]  Total GAD-7 Score 13 11 16 14 14    PHQ2-9    Flowsheet Row Counselor from 06/05/2023 in [facility] Visit from 04/16/2023 in [facility] Visit from 02/22/2023 in [facility] Visit from 10/10/2022 in [facility] Visit from 06/08/2022 in [facility]  PHQ-2 Total Score 4 2 4 4 5   PHQ-9 Total Score 13 10 16 15 16    Flowsheet Row ED from 12/14/2023 in [facility] at [facility] Counselor from 06/05/2023 in [facility] Office Visit from 04/16/2023 in [facility]  C-SSRS RISK CATEGORY No Risk Low Risk No Risk     Assessment and Plan:  HALEI HANOVER is a 63 y.o. year old female with a history of bipolar II disorder, OCD (germ phobia), ADHD, history of eating disorder both anorexia and bulimia,  hypertension, diabetes, gout, hypothyroidism. bilateral primary. The patient presents for follow up appointment for below.     1. Obsessive-compulsive disorder, unspecified type 2. Bipolar  II disorder (HCC) 3. Panic disorder She is struggling with separation anxiety related to her son, who goes to college. Psychologically, she experienced absence of nurturing from her mother, and struggled with financial strain. She has been unemployed since Sept 2022 and has been on disability.  History: seen by Dr. Chipper for many years, subthreshold  hypomanic symptoms in response to her husband's infidelity. Originally on duloxetine  30 mg daily, lamotrigine  200 mg daily   She continues to experience panic attacks, and worsening in anxiety during the day in the context of her son, who have left home for college.  She has been taking Xanax as needed, which she has been prescribed by her primary care provider.  She reports adverse reaction of feeling uneasy from higher dose of duloxetine , and self discontinued this medication.  Will start fluvoxamine  to target OCD, panic disorder while monitoring any medication induced mania.  Will continue lamotrigine  for bipolar disorder.    # insomnia Slight worsening in the context of worsening in and anxiety.  Of note,  she reports good benefit from light therapy during the winter season.  Previously discussed referral for evaluation of sleep apnea. She expressed  understanding to discuss this when she is interested.       # weight loss She is followed by Dr. Auston.  Recent abdominal CT without any significant abnormality.  She continues to have weight loss despite overall stable mood despite chronic OCD symptoms.  She has an upcoming follow-up.  Will continue to monitor this.    Plan Start fluvoxamine  25 mg at night for two weeks, then 50 mg at night  (Self discontinued duloxetine ) Continue lamotrigine  200 mg daily Next appointment: 11/18 at 8 30 for 30 mins, IP. Waitlist for sooner visit in late Oct - on xanax 0.25 mg up to twice a day    Past trials of medication: fluoxetine, sertraline, lexapro, duloxetine , Vraylar, Ability,   The patient demonstrates the  following risk factors for suicide: Chronic risk factors for suicide include: psychiatric disorder of OCD, bipolar disorder . Acute risk factors for suicide include: unemployment. Protective factors for this patient include: positive social support, responsibility to others (children, family), coping skills, and hope for the future. Considering these factors, the overall suicide risk at this point appears to be low. Patient is appropriate for outpatient follow up.     Collaboration of Care: Collaboration of Care: {BH OP Collaboration of Care:21014065}  Patient/Guardian was advised Release of Information must be obtained prior to any record release in order to collaborate their care with an outside provider. Patient/Guardian was advised if they have not already done so to contact the registration department to sign all necessary forms in order for us  to release information regarding their care.   Consent: Patient/Guardian gives verbal consent for treatment and assignment of benefits for services provided during this visit. Patient/Guardian expressed understanding and agreed to proceed.    Katheren Sleet, MD 01/20/2024, 12:08 PM

## 2024-01-23 ENCOUNTER — Other Ambulatory Visit: Payer: Self-pay | Admitting: Internal Medicine

## 2024-01-23 DIAGNOSIS — Z1231 Encounter for screening mammogram for malignant neoplasm of breast: Secondary | ICD-10-CM

## 2024-01-24 ENCOUNTER — Ambulatory Visit: Admitting: Professional Counselor

## 2024-01-24 ENCOUNTER — Ambulatory Visit: Admitting: Psychiatry

## 2024-01-24 ENCOUNTER — Encounter: Payer: Self-pay | Admitting: Psychiatry

## 2024-01-24 ENCOUNTER — Other Ambulatory Visit: Payer: Self-pay

## 2024-01-24 VITALS — BP 132/89 | HR 96 | Temp 98.1°F | Ht 64.0 in | Wt 136.4 lb

## 2024-01-24 DIAGNOSIS — F429 Obsessive-compulsive disorder, unspecified: Secondary | ICD-10-CM | POA: Diagnosis not present

## 2024-01-24 DIAGNOSIS — F410 Panic disorder [episodic paroxysmal anxiety] without agoraphobia: Secondary | ICD-10-CM | POA: Diagnosis not present

## 2024-01-24 DIAGNOSIS — F3181 Bipolar II disorder: Secondary | ICD-10-CM | POA: Diagnosis not present

## 2024-01-24 MED ORDER — DULOXETINE HCL 30 MG PO CPEP: 30.0000 mg | ORAL_CAPSULE | Freq: Every day | ORAL | 0 refills | Status: DC

## 2024-01-24 NOTE — Patient Instructions (Signed)
 Continue duloxetine  30 mg daily  Hold fluvoxamine  Continue lamotrigine  200 mg daily Next appointment: 1/8 at 8:30

## 2024-01-28 ENCOUNTER — Ambulatory Visit: Admitting: Professional Counselor

## 2024-01-28 DIAGNOSIS — F3181 Bipolar II disorder: Secondary | ICD-10-CM | POA: Diagnosis not present

## 2024-01-28 DIAGNOSIS — F410 Panic disorder [episodic paroxysmal anxiety] without agoraphobia: Secondary | ICD-10-CM

## 2024-01-28 DIAGNOSIS — F40240 Claustrophobia: Secondary | ICD-10-CM | POA: Diagnosis not present

## 2024-01-28 DIAGNOSIS — F429 Obsessive-compulsive disorder, unspecified: Secondary | ICD-10-CM | POA: Diagnosis not present

## 2024-01-28 NOTE — Progress Notes (Signed)
 THERAPIST PROGRESS NOTE  Virtual Visit via Video Note  I connected with Fendi A Allender on 01/28/24 at 10:00 AM EST by a video enabled telemedicine application and verified that I am speaking with the correct person using two identifiers.  Location: Patient: Home Provider: Remote office   I discussed the limitations of evaluation and management by telemedicine and the availability of in person appointments. The patient expressed understanding and agreed to proceed.   I discussed the assessment and treatment plan with the patient. The patient was provided an opportunity to ask questions and all were answered. The patient agreed with the plan and demonstrated an understanding of the instructions.   The patient was advised to call back or seek an in-person evaluation if the symptoms worsen or if the condition fails to improve as anticipated.  I provided 44 minutes of non-face-to-face time during this encounter. [name], [facility]  Session Time: 10:00 AM - 10:44 AM  Participation Level: Active  Behavioral Response: Casual, Alert, Anxious  Type of Therapy: Individual Therapy  Treatment Goals addressed:  Active Obsessions Compulsions  LTG: To be able to get rid of the gloves and the part with the germs. That's the most overwhelming part to me. I can't go to the beach because I can't sleep in the beds. (Progressing)                Start:  08/23/23    Expected End:  08/21/24    Goal Note Reviewed 01/10/24 - Still wearing gloves at times and fear of germs, has been able to hug people, eat at functions.    STG: Laveda will reduce time involved with obsessions and compulsions AEB self-report.  (Progressing)  Goal Note Reviewed 01/10/24 - Some good and not so good.   STG: Jazmine will understand underlying sources of obsessions and/or compulsions AEB evidence of restructured thinking around OCD thinking/behaviors (Progressing)  Goal Note Reviewed 01/10/24 - I think I'm doing okay  on that. I'm doing a little better since I started working with you. Now, I don't know if I can stop it, but I kind of understand why I'm doing it.   STG: Remingtyn will reduce anxiety around driving AEB driving longer distances and riding on the highway.  (Progressing)  Goal Note Reviewed 01/10/24 - Well I've been on 49 so that's progress. It's a back road but it's something. Probably I'm not doing as well on that.   ProgressTowards Goals: Progressing  Interventions: CBT, Motivational Interviewing, and Supportive  Summary: ADEA GEISEL is a 63 y.o. female who presents with a history of OCD, claustrophobia, panic, and bipolar disorder. She appeared alert and oriented x5. She stated she's been doing okay lately. Sherronda has continued to occupy her time with family, friends, and church activities. She shared some anxiety/panic around her television service going out but noted some ways she was able to accommodate for this. [place] stated her son accepted a internship in [place], which she prefers for him to be there over the job in [place]. She noted some ways she has engaged in exposure exercises and agreed with plan to continue working on those exposures. She shared hope about one day being able to travel again.   Therapist Response: Conducted session with Miarose. Began session with check-in/update since previous session. Utilized empathetic and reflective listening. Used open-ended questions to facilitate discussion and summarized Annarose's thoughts/feelings. Provided ways to reframe thinking around exposure exercises and progress in therapy. Identified ways to continue practicing exposure.  Scheduled additional appointment and concluded session.   Suicidal/Homicidal: No  Plan: Return again in 3 weeks.  Diagnosis: Obsessive-compulsive disorder, unspecified type  Bipolar II disorder (HCC)  Claustrophobia  Panic disorder  Collaboration of Care: Medication Management AEB chart  review  Patient/Guardian was advised Release of Information must be obtained prior to any record release in order to collaborate their care with an outside provider. Patient/Guardian was advised if they have not already done so to contact the registration department to sign all necessary forms in order for us  to release information regarding their care.   Consent: Patient/Guardian gives verbal consent for treatment and assignment of benefits for services provided during this visit. Patient/Guardian expressed understanding and agreed to proceed.   [name], [facility] 01/28/2024

## 2024-01-29 ENCOUNTER — Ambulatory Visit: Admitting: Psychiatry

## 2024-02-04 ENCOUNTER — Ambulatory Visit: Admitting: Professional Counselor

## 2024-02-06 ENCOUNTER — Encounter: Admitting: Dietician

## 2024-02-20 ENCOUNTER — Ambulatory Visit: Admission: RE | Admit: 2024-02-20 | Discharge: 2024-02-20 | Attending: Internal Medicine | Admitting: Internal Medicine

## 2024-02-20 DIAGNOSIS — Z1231 Encounter for screening mammogram for malignant neoplasm of breast: Secondary | ICD-10-CM | POA: Insufficient documentation

## 2024-02-21 ENCOUNTER — Ambulatory Visit: Admitting: Professional Counselor

## 2024-02-21 DIAGNOSIS — F3181 Bipolar II disorder: Secondary | ICD-10-CM | POA: Diagnosis not present

## 2024-02-21 DIAGNOSIS — F410 Panic disorder [episodic paroxysmal anxiety] without agoraphobia: Secondary | ICD-10-CM

## 2024-02-21 DIAGNOSIS — F40240 Claustrophobia: Secondary | ICD-10-CM

## 2024-02-21 DIAGNOSIS — F429 Obsessive-compulsive disorder, unspecified: Secondary | ICD-10-CM | POA: Diagnosis not present

## 2024-02-21 NOTE — Progress Notes (Signed)
 THERAPIST PROGRESS NOTE  Session Time: 9:01 AM - 9:55 AM   Participation Level: Active  Behavioral Response: Casual, Alert, Anxious  Type of Therapy: Individual Therapy  Treatment Goals addressed: Active Obsessions Compulsions  LTG: To be able to get rid of the gloves and the part with the germs. That's the most overwhelming part to me. I can't go to the beach because I can't sleep in the beds. (Progressing)                Start:  08/23/23    Expected End:  08/21/24    Goal Note Reviewed 01/10/24 - Still wearing gloves at times and fear of germs, has been able to hug people, eat at functions.    STG: Victoria Patterson will reduce time involved with obsessions and compulsions AEB self-report.  (Progressing)  Goal Note Reviewed 01/10/24 - Some good and not so good.   STG: Victoria Patterson will understand underlying sources of obsessions and/or compulsions AEB evidence of restructured thinking around OCD thinking/behaviors (Progressing)  Goal Note Reviewed 01/10/24 - I think I'm doing okay on that. I'm doing a little better since I started working with you. Now, I don't know if I can stop it, but I kind of understand why I'm doing it.   STG: Victoria Patterson will reduce anxiety around driving AEB driving longer distances and riding on the highway.  (Progressing)  Goal Note Reviewed 01/10/24 - Well I've been on 49 so that's progress. It's a back road but it's something. Probably I'm not doing as well on that.   ProgressTowards Goals: Progressing  Interventions: CBT, Motivational Interviewing, and Supportive  Summary: Victoria Patterson is a 63 y.o. female who presents with a history of OCD, claustrophobia, panic, and bipolar disorder. She appeared anxious but oriented x5. She stated she is having a hard time still. Victoria Patterson reported updates since her last session. She continues to panic over things and is having a hard time with rest/relaxation. She reported she cannot just sit at home and would like to work if she could.  She was receptive to resources through [employer]. Victoria Patterson noted updates with her son and fears around his upcoming internship, which has switched back to [place]. She was receptive to feedback about how her thinking patterns are not helpful and increasing her anxiety. Victoria Patterson was excited to share some ways she has continued to expose herself to OCD triggers, such as eating out in a restaurant.    Therapist Response: Conducted session with Victoria Patterson. Began session with check-in/update since previous session. Utilized empathetic and reflective listening. Used open-ended questions to facilitate discussion and summarized Victoria Patterson's thoughts/feelings. Highlighted cognitive distortions and encouraged Victoria Patterson to focus on balanced thinking and factual scenarios rather than what if's. Provided positive reinforcement for exposure exercises and reminded Victoria Patterson this is how the brain rewires itself. Scheduled additional appointment and concluded session.   Suicidal/Homicidal: No  Plan: Return again in 2 weeks.  Diagnosis: Panic disorder  Obsessive-compulsive disorder, unspecified type  Bipolar II disorder (HCC)  Claustrophobia  Collaboration of Care: Medication Management AEB chart review  Patient/Guardian was advised Release of Information must be obtained prior to any record release in order to collaborate their care with an outside provider. Patient/Guardian was advised if they have not already done so to contact the registration department to sign all necessary forms in order for us  to release information regarding their care.   Consent: Patient/Guardian gives verbal consent for treatment and assignment of benefits for services provided during  this visit. Patient/Guardian expressed understanding and agreed to proceed.   Victoria Patterson, [facility] 02/21/2024

## 2024-02-26 ENCOUNTER — Ambulatory Visit: Admitting: Dermatology

## 2024-02-26 ENCOUNTER — Encounter: Payer: Self-pay | Admitting: Dermatology

## 2024-02-26 DIAGNOSIS — B351 Tinea unguium: Secondary | ICD-10-CM

## 2024-02-26 DIAGNOSIS — L249 Irritant contact dermatitis, unspecified cause: Secondary | ICD-10-CM

## 2024-02-26 DIAGNOSIS — L219 Seborrheic dermatitis, unspecified: Secondary | ICD-10-CM

## 2024-02-26 MED ORDER — KETOCONAZOLE 2 % EX SHAM: MEDICATED_SHAMPOO | CUTANEOUS | 11 refills | Status: AC

## 2024-02-26 MED ORDER — CLOBETASOL PROPIONATE 0.05 % EX CREA: TOPICAL_CREAM | CUTANEOUS | 3 refills | Status: AC

## 2024-02-26 MED ORDER — FLUOCINOLONE ACETONIDE 0.01 % OT OIL: TOPICAL_OIL | OTIC | 5 refills | Status: AC

## 2024-02-26 NOTE — Patient Instructions (Addendum)
 Toenail:  Finish Fluconazole  as directed.    Hands: Start Clobetasol  cream twice a day to hands until dermatitis has resolved then use as needed for flares. Do not use more than 4 weeks at a time. Avoid applying to face, groin, and axilla. Use as directed. Long-term use can cause thinning of the skin.  Topical steroids (such as triamcinolone , fluocinolone , fluocinonide, mometasone, clobetasol , halobetasol, betamethasone , hydrocortisone) can cause thinning and lightening of the skin if they are used for too long in the same area. Your physician has selected the right strength medicine for your problem and area affected on the body. Please use your medication only as directed by your physician to prevent side effects.   Hand Dermatitis is a chronic type of eczema that can come and go on the hands and fingers.  While there is no cure, the rash and symptoms can be managed with topical prescription medications, and for more severe cases, with systemic medications.  Recommend mild soap and routine use of moisturizing cream after handwashing.  Minimize soap/water exposure when possible.     Due to recent changes in healthcare laws, you may see results of your pathology and/or laboratory studies on MyChart before the doctors have had a chance to review them. We understand that in some cases there may be results that are confusing or concerning to you. Please understand that not all results are received at the same time and often the doctors may need to interpret multiple results in order to provide you with the best plan of care or course of treatment. Therefore, we ask that you please give us  2 business days to thoroughly review all your results before contacting the office for clarification. Should we see a critical lab result, you will be contacted sooner.   If You Need Anything After Your Visit  If you have any questions or concerns for your doctor, please call our main line at [phone] and press  option 4 to reach your doctor's medical assistant. If no one answers, please leave a voicemail as directed and we will return your call as soon as possible. Messages left after 4 pm will be answered the following business day.   You may also send us  a message via MyChart. We typically respond to MyChart messages within 1-2 business days.  For prescription refills, please ask your pharmacy to contact our office. Our fax number is [phone].  If you have an urgent issue when the clinic is closed that cannot wait until the next business day, you can page your doctor at the number below.    Please note that while we do our best to be available for urgent issues outside of office hours, we are not available 24/7.   If you have an urgent issue and are unable to reach us , you may choose to seek medical care at your doctor's office, retail clinic, urgent care center, or emergency room.  If you have a medical emergency, please immediately call 911 or go to the emergency department.  Pager Numbers  - Dr. Hester: [phone]  - Dr. Jackquline: [phone]  - Dr. Claudene: [phone]   - Dr. Raymund: [phone]  In the event of inclement weather, please call our main line at [phone] for an update on the status of any delays or closures.  Dermatology Medication Tips: Please keep the boxes that topical medications come in in order to help keep track of the instructions about where and how to use these. Pharmacies typically print the medication  instructions only on the boxes and not directly on the medication tubes.   If your medication is too expensive, please contact our office at [phone] option 4 or send us  a message through MyChart.   We are unable to tell what your co-pay for medications will be in advance as this is different depending on your insurance coverage. However, we may be able to find a substitute medication at lower cost or fill out paperwork to get insurance to cover a  needed medication.   If a prior authorization is required to get your medication covered by your insurance company, please allow us  1-2 business days to complete this process.  Drug prices often vary depending on where the prescription is filled and some pharmacies may offer cheaper prices.  The website www.goodrx.com contains coupons for medications through different pharmacies. The prices here do not account for what the cost may be with help from insurance (it may be cheaper with your insurance), but the website can give you the price if you did not use any insurance.  - You can print the associated coupon and take it with your prescription to the pharmacy.  - You may also stop by our office during regular business hours and pick up a GoodRx coupon card.  - If you need your prescription sent electronically to a different pharmacy, notify our office through [facility] or by phone at [phone] option 4.     Si Usted Necesita Algo Despus de Su Visita  Tambin puede enviarnos un mensaje a travs de Clinical Cytogeneticist. Por lo general respondemos a los mensajes de MyChart en el transcurso de 1 a 2 das hbiles.  Para renovar recetas, por favor pida a su farmacia que se ponga en contacto con nuestra oficina. Randi lakes de fax es [place] [phone].  Si tiene un asunto urgente cuando la clnica est cerrada y que no puede esperar hasta el siguiente da hbil, puede llamar/localizar a su doctor(a) al nmero que aparece a continuacin.   Por favor, tenga en cuenta que aunque hacemos todo lo posible para estar disponibles para asuntos urgentes fuera del horario de [place], no estamos disponibles las 24 horas del da, los 7 [address]  [address] de la [place].   Si tiene un problema urgente y no puede comunicarse con nosotros, puede optar por buscar atencin mdica  en el consultorio de su doctor(a), en una clnica privada, en un centro de atencin urgente o en una sala de emergencias.  Si tiene psychologist, clinical, por favor llame inmediatamente al 911 o vaya a la sala de emergencias.  Nmeros de bper  - Dr. Hester: [phone]  - Dra. Jackquline: [phone]  - Dr. Claudene: [phone]  - Dra. Kitts: [phone]  En caso de inclemencias del [place], por favor llame a nuestra lnea principal al [phone] para una actualizacin sobre el estado de cualquier retraso o cierre.  Consejos para la medicacin en dermatologa: Por favor, guarde las cajas en las que vienen los medicamentos de uso tpico para ayudarle a seguir las instrucciones sobre dnde y cmo usarlos. Las farmacias generalmente imprimen las instrucciones del medicamento slo en las cajas y no directamente en los tubos del [place].   Si su medicamento es muy caro, por favor, pngase en contacto con landry rieger llamando al [phone] y presione la opcin 4 o envenos un mensaje a travs de Clinical Cytogeneticist.   No podemos decirle cul ser su copago por los medicamentos por adelantado ya que esto es diferente dependiendo de la oncologist  de su seguro. Sin embargo, es posible que podamos encontrar un medicamento sustituto a audiological scientist un formulario para que el seguro cubra el medicamento que se considera necesario.   Si se requiere una autorizacin previa para que su compaa de seguros cubra su medicamento, por favor permtanos de 1 a 2 das hbiles para completar este proceso.  Los precios de los medicamentos varan con frecuencia dependiendo del environmental consultant de dnde se surte la receta y alguna farmacias pueden ofrecer precios ms baratos.  El sitio web www.goodrx.com tiene cupones para medicamentos de health and safety inspector. Los precios aqu no tienen en cuenta lo que podra costar con la ayuda del seguro (puede ser ms barato con su seguro), pero el sitio web puede darle el precio si no utiliz tourist information centre manager.  - Puede imprimir el cupn correspondiente y llevarlo con su receta a la farmacia.  - Tambin puede pasar por nuestra  oficina durante el horario de atencin regular y education officer, museum una tarjeta de cupones de GoodRx.  - Si necesita que su receta se enve electrnicamente a una farmacia diferente, informe a nuestra oficina a travs de MyChart de  o por telfono llamando al [phone] y presione la opcin 4.

## 2024-02-26 NOTE — Progress Notes (Signed)
 Follow-Up Visit   Subjective  Victoria Patterson is a 63 y.o. female who presents for the following: 6 month follow up for seborrheic dermatitis, onychomycosis, telogen effluvium and hand dermatitis.   Patient states toenail is still yellow in color. Has been taking Diflucan  once a week as directed. Has noticed some improvement.   Uses Ketoconazole  shampoo as directed for seb derm. Has not been using Derm-otic oil drops in ears.   Hand dermatitis is flaring since last week. Has been washing hands more frequently. Has been using hand towel to dry instead of drying with paper towels. Is out of Triamcinolone  cream and isn't sure if insurance will cover it anymore.   The following portions of the chart were reviewed this encounter and updated as appropriate: medications, allergies, medical history  Review of Systems:  No other skin or systemic complaints except as noted in HPI or Assessment and Plan.  Objective  Well appearing patient in no apparent distress; mood and affect are within normal limits.  A focused examination was performed of the following areas: Scalp, face, hands.   Relevant exam findings are noted in the Assessment and Plan.       Assessment & Plan   SEBORRHEIC DERMATITIS Exam: Erythema/scale at ear canals, scalp clear.   Chronic and persistent condition with duration or expected duration over one year. Condition is symptomatic/ bothersome to patient. Not currently at goal.    Seborrheic Dermatitis is a chronic persistent rash characterized by pinkness and scaling most commonly of the mid face but also can occur on the scalp (dandruff), ears; mid chest, mid back and groin.  It tends to be exacerbated by stress and cooler weather.  People who have neurologic disease may experience new onset or exacerbation of existing seborrheic dermatitis.  The condition is not curable but treatable and can be controlled.   Treatment Plan: Continue Ketoconazole  2% shampoo 2-3 times a  week, let sit several minutes before rinsing.      Use Dermotic  Drops apply 1-2 drops to ears once to twice daily as needed for itch dsp 20mL 5Rf.  ONYCHOMYCOSIS Exam: distal thickening at L-3 toenail, much improved when compared to baseline photo  Chronic and persistent condition with duration or expected duration over one year. Condition is improving with treatment but not currently at goal.   Treatment Plan: Finish Fluconazole  weekly dose as directed.   HAND DERMATITIS- Irritant Exam: Hands and fingers with Scaly pink plaques +/- fissures  Chronic and persistent condition with duration or expected duration over one year. Condition is bothersome/symptomatic for patient. Currently flared.  Hand Dermatitis is a chronic type of eczema that can come and go on the hands and fingers.  While there is no cure, the rash and symptoms can be managed with topical prescription medications, and for more severe cases, with systemic medications.  Recommend mild soap and routine use of moisturizing cream after handwashing.  Minimize soap/water exposure when possible.    Treatment Plan Start Clobetasol  cream twice a day to hands until dermatitis has resolved then use as needed for flares. Avoid applying to face, groin, and axilla. Use as directed. Long-term use can cause thinning of the skin.  Topical steroids (such as triamcinolone , fluocinolone , fluocinonide, mometasone, clobetasol , halobetasol, betamethasone , hydrocortisone) can cause thinning and lightening of the skin if they are used for too long in the same area. Your physician has selected the right strength medicine for your problem and area affected on the body. Please use your medication  only as directed by your physician to prevent side effects.    Recommend mild soap and moisturizing cream with hand washing. Decrease frequency of hand washing.    Return in about 6 months (around 08/26/2024) for Seborrheic Dermatitis Follow Up.  I, Kate Fought, CMA, am acting as scribe for Rexene Rattler, MD.   Documentation: I have reviewed the above documentation for accuracy and completeness, and I agree with the above.  Rexene Rattler, MD

## 2024-02-28 ENCOUNTER — Encounter: Admitting: Dietician

## 2024-02-29 ENCOUNTER — Encounter

## 2024-03-03 ENCOUNTER — Ambulatory Visit: Admitting: Professional Counselor

## 2024-03-04 ENCOUNTER — Ambulatory Visit

## 2024-03-16 NOTE — Progress Notes (Signed)
 BH MD/PA/NP OP Progress Note  03/20/2024 9:03 AM Victoria Patterson  MRN:  [id number]  Chief Complaint:  Chief Complaint  Patient presents with   Follow-up   HPI:  This is a follow-up appointment for bipolar 2 disorder, OCD.  She states that her anxiety is sometimes good and sometimes that.  She was feeling a lot nervous when her son was not at home.  She enjoyed his stay during Christmas.  She was almost counting up to his leave.  She also feels nervous as her son might be starting internship in [place].  She has been trying to go on a walk when she feels anxious.  Although she had a panic attack, she has not been taking any Xanax.  She has been sleeping better except she has middle insomnia due to nocturia.  She has done over eating.  She states that she drinks boost.  She agrees to see if it contains any caffeine, which can exacerbate her anxiety.  She had a good time when she went out with her friend.  She is hoping to start a part-time job so that he can focus on things.  She continues to do handwashing, which was not changed when her son was there.  She denies SI, hallucinations.  She denies substance abuse.  She thinks things has been overall stable since being on the current medication, and would like to stay the same at this time.    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  She was spanked every day.  She remembers it vividly, although she denies any PTSD symptoms.  Her father was alcoholic, and died from toxic shock syndrome (he had lung  cancer)  Visit Diagnosis:    ICD-10-CM   1. Obsessive-compulsive disorder, unspecified type  F42.9     2. Panic disorder  F41.0     3. Bipolar II disorder (HCC)  F31.81       Past Psychiatric History: Please see initial evaluation for full details. I have reviewed the history. No updates at this time.     Past Medical History:  Past Medical History:  Diagnosis Date   ADHD (attention deficit hyperactivity disorder)    Anxiety    Arthritis    Asthma    controlled for many years   Depression    Diabetes mellitus, type II (HCC)    Fatty liver    Hypertension    Hypothyroidism    Obsessive-compulsive disorder     Past Surgical History:  Procedure Laterality Date   BREAST CYST ASPIRATION Left 1992   neg   CHOLECYSTECTOMY     dilation and evacuation     x2   HYSTEROSCOPY WITH D & C N/A 02/04/2018   Procedure: DILATATION AND CURETTAGE LELDON, fractional;  Surgeon: Schermerhorn, Debby PARAS, MD;  Location: [facility] ORS;  Service: Gynecology;  Laterality: N/A;    Family Psychiatric History: Please see initial evaluation for full details. I have reviewed the history. No updates at this time.     Family  History:  Family History  Problem Relation Age of Onset   Ovarian cancer Mother 44   Bipolar disorder Mother    Breast cancer Paternal Aunt 27   Breast cancer Cousin        maternal   Alcohol abuse Father     Social History:  Social History   Socioeconomic History   Marital status: Married    Spouse name: michael   Number of children: 1   Years of education: Not on file   Highest education level: Master's degree (e.g., MA, MS, MEng, MEd, MSW, MBA)  Occupational History   Not on file  Tobacco Use   Smoking status: Never    Passive exposure: Never   Smokeless tobacco: Never  Vaping Use   Vaping status: Never Used  Substance and Sexual Activity   Alcohol use: No   Drug use: No   Sexual activity: Not Currently  Other Topics Concern   Not on file  Social  History Narrative   Not on file   Social Drivers of Health   Tobacco Use: Low Risk (03/20/2024)   Patient History    Smoking Tobacco Use: Never    Smokeless Tobacco Use: Never    Passive Exposure: Never  Financial Resource Strain: Low Risk  (02/18/2024)   [facility] from [facility]   Overall Financial Resource Strain (CARDIA)    Difficulty of Paying Living Expenses: Not hard at all  Food Insecurity: No Food Insecurity (02/18/2024)   Received from [facility]   Epic    Within the past 12 months, you worried that your food would run out before you got the money to buy more.: Never true    Within the past 12 months, the food you bought just didn't last and you didn't have money to get more.: Never true  Transportation Needs: No Transportation Needs (02/18/2024)   Received from [facility] - Transportation    In the past 12 months, has lack of transportation kept you from medical appointments or from getting medications?: No    Lack of Transportation (Non-Medical): No  Physical Activity: Insufficiently Active (06/05/2023)   Exercise Vital Sign    Days of Exercise per Week: 4 days    Minutes of Exercise per Session: 30 min  Stress: Stress Concern Present (06/05/2023)   [employer] - Occupational Stress Questionnaire    Feeling of Stress : Very much  Social Connections: Socially Isolated (06/05/2023)   Social Connection and Isolation Panel    Frequency of Communication with Friends and Family: Once a week    Frequency of Social Gatherings with Friends and Family: Never    Attends Religious Services: Never    Database Administrator or Organizations: No    Attends Banker Meetings: Never    Marital Status: Married  Depression (PHQ2-9): High Risk (06/05/2023)   Depression (PHQ2-9)    PHQ-2 Score: 13  Alcohol Screen: Low Risk (06/05/2023)   Alcohol Screen    Last Alcohol Screening Score  (AUDIT): 0  Housing: Low Risk  (02/18/2024)   Received from [facility]   Epic    In the last 12 months, was there a time when you were not able to pay the mortgage or rent on time?: No    In the past 12 months, how many times have you moved where you were living?: 0    At any time in the past  12 months, were you homeless or living in a shelter (including now)?: No  Utilities: Not At Risk (02/18/2024)   Received from [facility]   Epic    In the past 12 months has the electric, gas, oil, or water company threatened to shut off services in your home?: No  Health Literacy: Adequate Health Literacy (06/05/2023)   B1300 Health Literacy    Frequency of need for help with medical instructions: Never    Allergies: Allergies[1]  Metabolic Disorder Labs: Lab Results  Component Value Date   HGBA1C 5.5 10/03/2022   MPG 111.15 10/03/2022   No results found for: PROLACTIN No results found for: CHOL, TRIG, HDL, CHOLHDL, VLDL, LDLCALC No results found for: TSH  Therapeutic Level Labs: No results found for: LITHIUM No results found for: VALPROATE No results found for: CBMZ  Current Medications: Current Outpatient Medications  Medication Sig Dispense Refill   Accu-Chek Softclix Lancets lancets 1 each 3 (three) times daily.     allopurinol  (ZYLOPRIM ) 300 MG tablet Take 300 mg by mouth daily.     bisoprolol (ZEBETA) 5 MG tablet Take 5 mg by mouth.     ciclopirox  (PENLAC ) 8 % solution Apply over nail and surrounding skin every night at bedtime. Apply daily over previous coat. After seven (7) days, may remove with alcohol and continue cycle. 6.6 mL 11   clobetasol  cream (TEMOVATE ) 0.05 % Apply twice daily to hands until dermatitis has resolved then use as needed. Avoid applying to face, groin, and axilla. Use as directed. 30 g 3   clotrimazole (LOTRIMIN) 1 % external solution      conjugated estrogens  (PREMARIN ) vaginal cream Apply 0.5mg   (pea-sized amount)  just inside the vaginal introitus with a finger-tip on  Monday, Wednesday and Friday nights. 30 g 12   Cranberry (RA CRANBERRY) 500 MG CAPS Take by mouth once.     [START ON 05/05/2024] DULoxetine  (CYMBALTA ) 30 MG capsule Take 1 capsule (30 mg total) by mouth daily. 90 capsule 1   Evolocumab 140 MG/ML SOAJ Inject 140 mg into the skin every 14 (fourteen) days.     ezetimibe  (ZETIA ) 10 MG tablet Take 10 mg by mouth daily.     fluconazole  (DIFLUCAN ) 200 MG tablet Take 1 tablet once daily until finished. 30 tablet 0   Fluocinolone  Acetonide 0.01 % OIL Apply 1-2 drops to ears once to twice daily as needed for itch. 20 mL 5   ketoconazole  (NIZORAL ) 2 % shampoo 2-3 times a week lather on scalp, leave on 5-8 minutes, rinse out 120 mL 11   lamoTRIgine  (LAMICTAL ) 200 MG tablet Take 1 tablet (200 mg total) by mouth daily. 90 tablet 1   levothyroxine  (SYNTHROID , LEVOTHROID) 75 MCG tablet Take 75 mcg by mouth daily before breakfast.     PRECISION QID TEST test strip 2 (two) times daily at 10 AM and 5 PM.     Prenatal Vit-Fe Fumarate-FA (PRENATAL 19 PO) Take by mouth.     triamcinolone  cream (KENALOG ) 0.1 % Apply twice daily to hands up to 2 weeks as needed for rash/itching. Avoid applying to face, groin, and axilla. 30 g 2   No current facility-administered medications for this visit.     Musculoskeletal: Strength & Muscle Tone: within normal limits Gait & Station: normal Patient leans: N/A  Psychiatric Specialty Exam: Review of Systems  Psychiatric/Behavioral:  Positive for sleep disturbance. Negative for agitation, behavioral problems, confusion, decreased concentration, dysphoric mood, hallucinations, self-injury and suicidal ideas. The patient  is nervous/anxious. The patient is not hyperactive.   All other systems reviewed and are negative.   Blood pressure (!) 159/96, pulse 71, temperature 97.8 F (36.6 C), temperature source Temporal, height 5' 4 (1.626 m), weight 143 lb  (64.9 kg).Body mass index is 24.55 kg/m.  General Appearance: Well Groomed  Eye Contact:  Good  Speech:  Clear and Coherent  Volume:  Normal  Mood:  Anxious  Affect:  Appropriate, Congruent, and calm  Thought Process:  Coherent  Orientation:  Full (Time, Place, and Person)  Thought Content: Logical   Suicidal Thoughts:  No  Homicidal Thoughts:  No  Memory:  Immediate;   Good  Judgement:  Good  Insight:  Good  Psychomotor Activity:  Normal  Concentration:  Concentration: Good and Attention Span: Good  Recall:  Good  Fund of Knowledge: Good  Language: Good  Akathisia:  No  Handed:  Right  AIMS (if indicated): not done  Assets:  Communication Skills Desire for Improvement  ADL's:  Intact  Cognition: WNL  Sleep:  Good   Screenings: GAD-7    Advertising Copywriter from 06/05/2023 in [facility] Visit from 04/16/2023 in [facility] Visit from 02/22/2023 in [facility] Visit from 10/10/2022 in [facility] Visit from 06/08/2022 in [facility]  Total GAD-7 Score 13 11 16 14 14    PHQ2-9    Flowsheet Row Counselor from 06/05/2023 in [facility] Visit from 04/16/2023 in [facility] Visit from 02/22/2023 in [facility] Visit from 10/10/2022 in [facility] Visit from 06/08/2022 in [facility]  PHQ-2 Total Score 4 2 4 4 5   PHQ-9 Total Score 13 10 16 15 16    Flowsheet Row ED from 12/14/2023 in [facility] Department at [facility] Counselor from 06/05/2023 in [facility] Office Visit from  04/16/2023 in [facility]  C-SSRS RISK CATEGORY No Risk Low Risk No Risk     Assessment and Plan:  Victoria Patterson is a 64 year old female with a history of bipolar II disorder, OCD (germ phobia), ADHD, history of eating disorder both anorexia and bulimia,  hypertension, diabetes, gout, hypothyroidism. bilateral primary. The patient presents for follow up appointment for below.     1. Obsessive-compulsive disorder, unspecified type 2. Panic disorder 3. Bipolar II disorder (HCC) She is struggling with separation anxiety related to her son, who goes to college. Psychologically, she experienced absence of nurturing from her mother, and struggled with financial strain. She has been unemployed since Sept 2022 and has been on disability.  History: seen by Dr. Chipper for many years, subthreshold hypomanic symptoms in response to her husband's infidelity. Originally on duloxetine  30 mg daily, lamotrigine  200 mg daily     Exam is notable for calm affect.  She continues to experience anxiety with occasional panic attacks, which was slightly improving when her son was at home.  She continues to do cleaning her hands excessively as well.  Although it was discussed to consider adjunctive treatment with antipsychotics, she prefers to maintain current medication regimen as she has been relatively doing better.  It is notable that she is willing to consider starting part-time job, which would likely be  tremendously helpful for her condition.  Will continue current dose of duloxetine  to target OCD, panic disorder off label.  Will continue lamotrigine  for bipolar disorder. She will greatly benefit from CBT; she will continue to see Ms. Veva for this.    # insomnia Improving.  Of note,  she reports good benefit from light therapy during the winter season.  Previously discussed referral for evaluation of sleep apnea. She expressed  understanding to discuss this when she is  interested.       # weight loss Improving . she is followed by Dr. Auston.  Recent abdominal CT without any significant abnormality.  She continues to have weight loss despite overall stable mood despite chronic OCD symptoms.  She has an upcoming follow-up.  Will continue to monitor this.    Plan Continue duloxetine  30 mg daily  Continue lamotrigine  200 mg daily Next appointment: 3/3 at 8:30, IP - not on xanax 0.25 mg up to twice a day anymore    Past trials of medication: fluoxetine, sertraline, lexapro, duloxetine , fluvoxamine  (some side effect), Vraylar, Ability,   The patient demonstrates the following risk factors for suicide: Chronic risk factors for suicide include: psychiatric disorder of OCD, bipolar disorder . Acute risk factors for suicide include: unemployment. Protective factors for this patient include: positive social support, responsibility to others (children, family), coping skills, and hope for the future. Considering these factors, the overall suicide risk at this point appears to be low. Patient is appropriate for outpatient follow up.   Collaboration of Care: Collaboration of Care: Other reviewed notes in Epic  Patient/Guardian was advised Release of Information must be obtained prior to any record release in order to collaborate their care with an outside provider. Patient/Guardian was advised if they have not already done so to contact the registration department to sign all necessary forms in order for us  to release information regarding their care.   Consent: Patient/Guardian gives verbal consent for treatment and assignment of benefits for services provided during this visit. Patient/Guardian expressed understanding and agreed to proceed.    Katheren Sleet, MD 03/20/2024, 9:03 AM     [1]  Allergies Allergen Reactions   Bactrim [Sulfamethoxazole-Trimethoprim] Other (See Comments)    hypoglycemia   Ciprofloxacin  Other (See Comments)    She experienced  hypertension and pedal edema which caused her to seek treatment in the ED.   Penicillins Hives    Has patient had a PCN reaction causing immediate rash, facial/tongue/throat swelling, SOB or lightheadedness with hypotension: No Has patient had a PCN reaction causing severe rash involving mucus membranes or skin necrosis: Yes Has patient had a PCN reaction that required hospitalization: No Has patient had a PCN reaction occurring within the last 10 years: No If all of the above answers are NO, then may proceed with Cephalosporin use.    Stadol [Butorphanol] Anxiety

## 2024-03-20 ENCOUNTER — Encounter: Payer: Self-pay | Admitting: Psychiatry

## 2024-03-20 ENCOUNTER — Ambulatory Visit: Admitting: Psychiatry

## 2024-03-20 ENCOUNTER — Other Ambulatory Visit: Payer: Self-pay

## 2024-03-20 VITALS — BP 159/96 | HR 71 | Temp 97.8°F | Ht 64.0 in | Wt 143.0 lb

## 2024-03-20 DIAGNOSIS — F429 Obsessive-compulsive disorder, unspecified: Secondary | ICD-10-CM | POA: Diagnosis not present

## 2024-03-20 DIAGNOSIS — F410 Panic disorder [episodic paroxysmal anxiety] without agoraphobia: Secondary | ICD-10-CM

## 2024-03-20 DIAGNOSIS — F3181 Bipolar II disorder: Secondary | ICD-10-CM

## 2024-03-20 MED ORDER — DULOXETINE HCL 30 MG PO CPEP: 30.0000 mg | ORAL_CAPSULE | Freq: Every day | ORAL | 1 refills | Status: AC

## 2024-03-20 NOTE — Patient Instructions (Signed)
 Continue duloxetine  30 mg daily  Continue lamotrigine  200 mg daily Next appointment: 3/3 at 8:30

## 2024-03-25 ENCOUNTER — Ambulatory Visit: Admitting: Professional Counselor

## 2024-03-25 DIAGNOSIS — F410 Panic disorder [episodic paroxysmal anxiety] without agoraphobia: Secondary | ICD-10-CM

## 2024-03-25 DIAGNOSIS — F3181 Bipolar II disorder: Secondary | ICD-10-CM | POA: Diagnosis not present

## 2024-03-25 DIAGNOSIS — F429 Obsessive-compulsive disorder, unspecified: Secondary | ICD-10-CM

## 2024-03-25 NOTE — Progress Notes (Signed)
 " THERAPIST PROGRESS NOTE  Session Time: 8:03 AM - 8:48 AM  Participation Level: Active  Behavioral Response: Casual, Alert, Anxious  Type of Therapy: Individual Therapy  Treatment Goals addressed:  Active Obsessions Compulsions  LTG: To be able to get rid of the gloves and the part with the germs. That's the most overwhelming part to me. I can't go to the beach because I can't sleep in the beds. (Progressing)                Start:  08/23/23    Expected End:  08/21/24    Goal Note Reviewed 01/10/24 - Still wearing gloves at times and fear of germs, has been able to hug people, eat at functions.    STG: Suhaila will reduce time involved with obsessions and compulsions AEB self-report.  (Progressing)  Goal Note Reviewed 01/10/24 - Some good and not so good.   STG: Harli will understand underlying sources of obsessions and/or compulsions AEB evidence of restructured thinking around OCD thinking/behaviors (Progressing)  Goal Note Reviewed 01/10/24 - I think I'm doing okay on that. I'm doing a little better since I started working with you. Now, I don't know if I can stop it, but I kind of understand why I'm doing it.   STG: Ellan will reduce anxiety around driving AEB driving longer distances and riding on the highway.  (Progressing)  Goal Note Reviewed 01/10/24 - Well I've been on 49 so that's progress. It's a back road but it's something. Probably I'm not doing as well on that.   ProgressTowards Goals: Progressing  Interventions: Motivational Interviewing and Supportive  Summary: Victoria Patterson is a 64 y.o. female who presents with a history of OCD, claustrophobia, panic, and bipolar disorder. She appeared somber but oriented x5. She reported she has continued to struggle with anxiety, although it has improved in some ways. Onda discussed the holidays and spending more time with her son. She is still interested in working and reported a need due to financial strain, but is concerned  about the impact on her disability. She was receptive to additional information about Vocational Rehab. Merdis expressed concerns about how her OCD might impact her ability to work. She expressed understanding about homework assignment. She was happy to report she was able to ride the elevator a couple times. She rode the elevator downstairs from this session with this clinical research associate present.   Therapist Response: Conducted session with Victoria Patterson. Began session with check-in/update since previous session. Utilized empathetic and reflective listening. Used open-ended questions to facilitate discussion and summarized Victoria Patterson's thoughts/feelings. Provided information on Vocational Rehab to help Victoria Patterson with employment search. Assessed current impact of OCD. Assigned homework to track OCD behaviors, specifically hand washing. Provided positive reinforcement on exposure exercise of riding elevator and accompanied Victoria Patterson on the elevator as she left from today's session.   Suicidal/Homicidal: No  Plan: Return again in 3 weeks.  Diagnosis: Obsessive-compulsive disorder, unspecified type  Panic disorder  Bipolar II disorder (HCC)  Collaboration of Care: Medication Management AEB chart review  Patient/Guardian was advised Release of Information must be obtained prior to any record release in order to collaborate their care with an outside provider. Patient/Guardian was advised if they have not already done so to contact the registration department to sign all necessary forms in order for us  to release information regarding their care.   Consent: Patient/Guardian gives verbal consent for treatment and assignment of benefits for services provided during this visit. Patient/Guardian expressed understanding and agreed  to proceed.   Victoria Patterson, [facility] 03/25/2024  "

## 2024-03-26 ENCOUNTER — Ambulatory Visit: Admitting: Dermatology

## 2024-03-26 DIAGNOSIS — L650 Telogen effluvium: Secondary | ICD-10-CM | POA: Diagnosis not present

## 2024-03-26 DIAGNOSIS — Z79899 Other long term (current) drug therapy: Secondary | ICD-10-CM

## 2024-03-26 DIAGNOSIS — L309 Dermatitis, unspecified: Secondary | ICD-10-CM

## 2024-03-26 DIAGNOSIS — L249 Irritant contact dermatitis, unspecified cause: Secondary | ICD-10-CM

## 2024-03-26 MED ORDER — TACROLIMUS 0.1 % EX OINT: TOPICAL_OINTMENT | CUTANEOUS | 3 refills | Status: AC

## 2024-03-26 MED ORDER — MINOXIDIL 2.5 MG PO TABS: 2.5000 mg | ORAL_TABLET | Freq: Every day | ORAL | 5 refills | Status: AC

## 2024-03-26 NOTE — Progress Notes (Signed)
 "  Follow-Up Visit   Subjective  Victoria Patterson is a 64 y.o. female who presents for the following: 4 wk follow-up hand dermatitis. She used clobetasol  cream for a little over 2 weeks. She can't keep it on due to washing hands multiple times during the day. She uses moisturizers, but hands are still not improving.  She uses Latex and Nitrile gloves.  Patient with hair loss. Has worsened since last discussed in June 2025. No sickness, surgery. History of big weight loss in the past, but patient has gained some back. Pt does not eat meat, ferritin was checked last spring as was normal.  She does drink daily protein shake.   The following portions of the chart were reviewed this encounter and updated as appropriate: medications, allergies, medical history  Review of Systems:  No other skin or systemic complaints except as noted in HPI or Assessment and Plan.  Objective  Well appearing patient in no apparent distress; mood and affect are within normal limits.  A focused examination was performed of the following areas: Face, scalp, hands  Relevant physical exam findings are noted in the Assessment and Plan.    Assessment & Plan  IRRITANT HAND DERMATITIS from frequent hand washing Exam: Diffuse erythema, scaling at the BL hand and finger dorsa with lichenification; diffuse scaling at the palms.  Chronic and persistent condition with duration or expected duration over one year. Condition is bothersome/symptomatic for patient. Currently flared.   Hand Dermatitis is a chronic type of eczema that can come and go on the hands and fingers.  While there is no cure, the rash and symptoms can be managed with topical prescription medications, and for more severe cases, with systemic medications.  Recommend mild soap and routine use of moisturizing cream after handwashing.  Minimize soap/water exposure when possible.    Treatment Plan Avoid Latex gloves. Wear Nitrile gloves during the day, when  needed to minimize hand washing. Decrease hand-washing frequency as much as possible.  Apply Clobetasol  cream every morning and evening followed by moisturizer and nitrile gloves. Use no longer than 2 weeks at a time.  Start tacrolimus  0.1% ointment to hands once to twice daily for rash. Can start after finish using clobetasol . Ok to use long-term.  Discussed Dupixent injections if not improving with topicals.   Recommend mild soap and moisturizing cream with hand washing. CeraVe SA, Neutrogena Hand Cream, Aquaphor, and Vanicream samples given.   Topical steroids (such as triamcinolone , fluocinolone , fluocinonide, mometasone, clobetasol , halobetasol, betamethasone , hydrocortisone) can cause thinning and lightening of the skin if they are used for too long in the same area. Your physician has selected the right strength medicine for your problem and area affected on the body. Please use your medication only as directed by your physician to prevent side effects.   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 It does not progress to baldness, and the hair eventually grows back on its own. It can be triggered by recent illness, recent surgery, thyroid  disease, low iron stores, vitamin D  deficiency, fad diets or rapid weight loss, hormonal changes such as pregnancy or birth control pills, and some medication. Usually the hair loss starts 2-3 months after the illness or health change. Rarely, it can continue for longer than a year. Treatments options may include oral or topical Minoxidil ; Red Light scalp treatments; Biotin 2.5 mg daily and  other options.  Treatment Plan: BP 132/94 02/13/23 TSH normal ,Ferritin 05/21/23 normal  Can try Vital Proteins (Collagen Peptides) to add more protein to diet to promote hair growth. May also take Viviscal hair growth  vitamin supplements. Recommend healthy diet. Do not skip meals. Make sure you are eating enough protein.   Start minoxidil  2.5 MG take 1 po every day dsp #30 5Rf. (start with 1/2 tablet x 1 month. If no side effects, may increase to full tablet.)  Doses of oral minoxidil  for hair loss are considered low dose. This is because the doses used for hair loss are much lower than the doses which are used for conditions such as high blood pressure (hypertension). The doses used for hypertension are 10-40mg  per day.  Side effects are uncommon at the low doses (up to 2.5 mg/day) used to treat hair loss. Potential side effects, more commonly seen at higher doses, include: Increase in hair growth (hypertrichosis) elsewhere on face and body Temporary hair shedding upon starting medication which may last up to 4 weeks Ankle swelling, fluid retention, rapid weight gain more than 5 pounds Low blood pressure and feeling lightheaded or dizzy when standing up quickly Fast or irregular heartbeat Headaches  OR  Recommend minoxidil  5% (Rogaine  for men) solution or foam to be applied to the scalp and left in. This should ideally be used twice daily for best results but it helps with hair regrowth when used at least three times per week. Rogaine  initially can cause increased hair shedding for the first few weeks but this will stop with continued use. In studies, people who used minoxidil  (Rogaine ) for at least 6 months had thicker hair than people who did not. Minoxidil  topical (Rogaine ) only works as long as it continues to be used. If if it is no longer used then the hair it has been helping to regrow can fall out. Minoxidil  topical (Rogaine ) can cause increased facial hair growth which can usually be managed easily with a battery-operated hair trimmer. If facial hair growth is bothersome, switching to the 2% women's version can decrease the risk of unwanted facial hair growth.  Long term medication management.  Patient  is using long term (months to years) prescription medication  to control their dermatologic condition.  These medications require periodic monitoring to evaluate for efficacy and side effects and may require periodic laboratory monitoring.  HAND DERMATITIS   This Visit - tacrolimus  (PROTOPIC ) 0.1 % ointment - Apply to hands once to twice daily as needed for rash.   Return in about 1 month (around 04/26/2024) for hand dermatitis.  IAndrea Kerns, CMA, am acting as scribe for Rexene Rattler, MD .   Documentation: I have reviewed the above documentation for accuracy and completeness, and I agree with the above.  Rexene Rattler, MD    "

## 2024-03-26 NOTE — Patient Instructions (Addendum)
 Apply Clobetasol  cream every morning and evening followed by moisturizer and nitrile gloves. Avoid Latex gloves. Avoid applying to face, groin, and axilla. Use as directed. Long-term use can cause thinning of the skin.   Topical steroids (such as triamcinolone , fluocinolone , fluocinonide, mometasone, clobetasol , halobetasol, betamethasone , hydrocortisone) can cause thinning and lightening of the skin if they are used for too long in the same area. Your physician has selected the right strength medicine for your problem and area affected on the body. Please use your medication only as directed by your physician to prevent side effects.    Minoxidil  2.5 mg take 1/2 tablet daily x 1 month. If no side effects, may increase to full tablet daily.  Doses of oral minoxidil  for hair loss are considered low dose. This is because the doses used for hair loss are much lower than the doses which are used for conditions such as high blood pressure (hypertension). The doses used for hypertension are 10-40mg  per day.  Side effects are uncommon at the low doses (up to 2.5 mg/day) used to treat hair loss. Potential side effects, more commonly seen at higher doses, include: Increase in hair growth (hypertrichosis) elsewhere on face and body Temporary hair shedding upon starting medication which may last up to 4 weeks Ankle swelling, fluid retention, rapid weight gain more than 5 pounds Low blood pressure and feeling lightheaded or dizzy when standing up quickly Fast or irregular heartbeat Headaches  OR  Recommend minoxidil  5% (Rogaine  for men) solution or foam to be applied to the scalp and left in. This should ideally be used twice daily for best results but it helps with hair regrowth when used at least three times per week. Rogaine  initially can cause increased hair shedding for the first few weeks but this will stop with continued use. In studies, people who used minoxidil  (Rogaine ) for at least 6 months had  thicker hair than people who did not. Minoxidil  topical (Rogaine ) only works as long as it continues to be used. If if it is no longer used then the hair it has been helping to regrow can fall out. Minoxidil  topical (Rogaine ) can cause increased facial hair growth which can usually be managed easily with a battery-operated hair trimmer. If facial hair growth is bothersome, switching to the 2% women's version can decrease the risk of unwanted facial hair growth.    Can try Vital Proteins (Collagen Peptides) to add more protein to diet to promote hair growth. May also take Viviscal hair growth vitamin supplements.    Due to recent changes in healthcare laws, you may see results of your pathology and/or laboratory studies on MyChart before the doctors have had a chance to review them. We understand that in some cases there may be results that are confusing or concerning to you. Please understand that not all results are received at the same time and often the doctors may need to interpret multiple results in order to provide you with the best plan of care or course of treatment. Therefore, we ask that you please give us  2 business days to thoroughly review all your results before contacting the office for clarification. Should we see a critical lab result, you will be contacted sooner.   If You Need Anything After Your Visit  If you have any questions or concerns for your doctor, please call our main line at [phone] and press option 4 to reach your doctor's medical assistant. If no one answers, please leave a voicemail as directed  and we will return your call as soon as possible. Messages left after 4 pm will be answered the following business day.   You may also send us  a message via MyChart. We typically respond to MyChart messages within 1-2 business days.  For prescription refills, please ask your pharmacy to contact our office. Our fax number is [phone].  If you have an urgent issue  when the clinic is closed that cannot wait until the next business day, you can page your doctor at the number below.    Please note that while we do our best to be available for urgent issues outside of office hours, we are not available 24/7.   If you have an urgent issue and are unable to reach us , you may choose to seek medical care at your doctor's office, retail clinic, urgent care center, or emergency room.  If you have a medical emergency, please immediately call 911 or go to the emergency department.  Pager Numbers  - Dr. Hester: [phone]  - Dr. Jackquline: [phone]  - Dr. Claudene: [phone]   - Dr. Raymund: [phone]  In the event of inclement weather, please call our main line at [phone] for an update on the status of any delays or closures.  Dermatology Medication Tips: Please keep the boxes that topical medications come in in order to help keep track of the instructions about where and how to use these. Pharmacies typically print the medication instructions only on the boxes and not directly on the medication tubes.   If your medication is too expensive, please contact our office at [phone] option 4 or send us  a message through MyChart.   We are unable to tell what your co-pay for medications will be in advance as this is different depending on your insurance coverage. However, we may be able to find a substitute medication at lower cost or fill out paperwork to get insurance to cover a needed medication.   If a prior authorization is required to get your medication covered by your insurance company, please allow us  1-2 business days to complete this process.  Drug prices often vary depending on where the prescription is filled and some pharmacies may offer cheaper prices.  The website www.goodrx.com contains coupons for medications through different pharmacies. The prices here do not account for what the cost may be with help from insurance (it may be  cheaper with your insurance), but the website can give you the price if you did not use any insurance.  - You can print the associated coupon and take it with your prescription to the pharmacy.  - You may also stop by our office during regular business hours and pick up a GoodRx coupon card.  - If you need your prescription sent electronically to a different pharmacy, notify our office through [facility] or by phone at [phone] option 4.     Si Usted Necesita Algo Despus de Su Visita  Tambin puede enviarnos un mensaje a travs de Clinical Cytogeneticist. Por lo general respondemos a los mensajes de MyChart en el transcurso de 1 a 2 das hbiles.  Para renovar recetas, por favor pida a su farmacia que se ponga en contacto con nuestra oficina. Randi lakes de fax es [place] [phone].  Si tiene un asunto urgente cuando la clnica est cerrada y que no puede esperar hasta el siguiente da hbil, puede llamar/localizar a su doctor(a) al nmero que aparece a continuacin.   Por favor, tenga en cuenta que aunque  hacemos todo lo posible para estar disponibles para asuntos urgentes fuera del horario de oficina, no estamos disponibles las 24 horas del da, los 7 [address]  [address] de la [place].   Si tiene un problema urgente y no puede comunicarse con nosotros, puede optar por buscar atencin mdica  en el consultorio de su doctor(a), en una clnica privada, en un centro de atencin urgente o en una sala de emergencias.  Si tiene engineer, drilling, por favor llame inmediatamente al 911 o vaya a la sala de emergencias.  Nmeros de bper  - Dr. Hester: [phone]  - Dra. Jackquline: [phone]  - Dr. Claudene: [phone]  - Dra. Kitts: [phone]  En caso de inclemencias del [place], por favor llame a nuestra lnea principal al [phone] para una actualizacin sobre el estado de cualquier retraso o cierre.  Consejos para la medicacin en dermatologa: Por favor, guarde las cajas en las que vienen  los medicamentos de uso tpico para ayudarle a seguir las instrucciones sobre dnde y cmo usarlos. Las farmacias generalmente imprimen las instrucciones del medicamento slo en las cajas y no directamente en los tubos del [place].   Si su medicamento es muy caro, por favor, pngase en contacto con landry rieger llamando al [phone] y presione la opcin 4 o envenos un mensaje a travs de Clinical Cytogeneticist.   No podemos decirle cul ser su copago por los medicamentos por adelantado ya que esto es diferente dependiendo de la cobertura de su seguro. Sin embargo, es posible que podamos encontrar un medicamento sustituto a audiological scientist un formulario para que el seguro cubra el medicamento que se considera necesario.   Si se requiere una autorizacin previa para que su compaa de seguros cubra su medicamento, por favor permtanos de 1 a 2 das hbiles para completar este proceso.  Los precios de los medicamentos varan con frecuencia dependiendo del environmental consultant de dnde se surte la receta y alguna farmacias pueden ofrecer precios ms baratos.  El sitio web www.goodrx.com tiene cupones para medicamentos de health and safety inspector. Los precios aqu no tienen en cuenta lo que podra costar con la ayuda del seguro (puede ser ms barato con su seguro), pero el sitio web puede darle el precio si no utiliz tourist information centre manager.  - Puede imprimir el cupn correspondiente y llevarlo con su receta a la farmacia.  - Tambin puede pasar por nuestra oficina durante el horario de atencin regular y education officer, museum una tarjeta de cupones de GoodRx.  - Si necesita que su receta se enve electrnicamente a una farmacia diferente, informe a nuestra oficina a travs de MyChart de [employer] o por telfono llamando al [phone] y presione la opcin 4.

## 2024-04-15 ENCOUNTER — Ambulatory Visit: Admitting: Professional Counselor

## 2024-04-30 ENCOUNTER — Ambulatory Visit: Admitting: Dermatology

## 2024-05-08 ENCOUNTER — Ambulatory Visit: Admitting: Professional Counselor

## 2024-05-13 ENCOUNTER — Ambulatory Visit: Admitting: Psychiatry

## 2024-07-23 ENCOUNTER — Ambulatory Visit: Admitting: Urology

## 2024-09-09 ENCOUNTER — Ambulatory Visit: Admitting: Dermatology
